# Patient Record
Sex: Female | Born: 1935 | Race: White | Hispanic: No | Smoking: Never smoker
Health system: Southern US, Community
[De-identification: ages and names within clinical notes are randomized; demographics above are authoritative.]

## PROBLEM LIST (undated history)

## (undated) DIAGNOSIS — S32591D Other specified fracture of right pubis, subsequent encounter for fracture with routine healing: Secondary | ICD-10-CM

## (undated) DIAGNOSIS — H409 Unspecified glaucoma: Secondary | ICD-10-CM

## (undated) DIAGNOSIS — U071 COVID-19: Secondary | ICD-10-CM

## (undated) DIAGNOSIS — R569 Unspecified convulsions: Secondary | ICD-10-CM

## (undated) DIAGNOSIS — R0600 Dyspnea, unspecified: Secondary | ICD-10-CM

## (undated) DIAGNOSIS — K219 Gastro-esophageal reflux disease without esophagitis: Secondary | ICD-10-CM

## (undated) DIAGNOSIS — I5033 Acute on chronic diastolic (congestive) heart failure: Secondary | ICD-10-CM

## (undated) DIAGNOSIS — E039 Hypothyroidism, unspecified: Secondary | ICD-10-CM

## (undated) DIAGNOSIS — M4850XA Collapsed vertebra, not elsewhere classified, site unspecified, initial encounter for fracture: Secondary | ICD-10-CM

## (undated) DIAGNOSIS — K56609 Unspecified intestinal obstruction, unspecified as to partial versus complete obstruction: Secondary | ICD-10-CM

## (undated) DIAGNOSIS — M549 Dorsalgia, unspecified: Secondary | ICD-10-CM

## (undated) DIAGNOSIS — I10 Essential (primary) hypertension: Secondary | ICD-10-CM

## (undated) DIAGNOSIS — I639 Cerebral infarction, unspecified: Secondary | ICD-10-CM

## (undated) DIAGNOSIS — G8929 Other chronic pain: Secondary | ICD-10-CM

## (undated) DIAGNOSIS — E785 Hyperlipidemia, unspecified: Secondary | ICD-10-CM

## (undated) DIAGNOSIS — E079 Disorder of thyroid, unspecified: Secondary | ICD-10-CM

## (undated) HISTORY — PX: CATARACT EXTRACTION, BILATERAL: SHX1313

## (undated) HISTORY — PX: EYE SURGERY: SHX253

## (undated) HISTORY — PX: CHOLECYSTECTOMY: SHX55

## (undated) HISTORY — DX: Disorder of thyroid, unspecified: E07.9

## (undated) HISTORY — DX: Essential (primary) hypertension: I10

## (undated) HISTORY — DX: Acute on chronic diastolic (congestive) heart failure: I50.33

## (undated) HISTORY — DX: Cerebral infarction, unspecified: I63.9

---
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
## 2014-05-29 DIAGNOSIS — Z8673 Personal history of transient ischemic attack (TIA), and cerebral infarction without residual deficits: Secondary | ICD-10-CM

## 2014-10-02 DIAGNOSIS — H409 Unspecified glaucoma: Secondary | ICD-10-CM | POA: Insufficient documentation

## 2014-10-02 DIAGNOSIS — R569 Unspecified convulsions: Secondary | ICD-10-CM

## 2014-10-02 DIAGNOSIS — G40909 Epilepsy, unspecified, not intractable, without status epilepticus: Secondary | ICD-10-CM | POA: Insufficient documentation

## 2014-10-02 DIAGNOSIS — I639 Cerebral infarction, unspecified: Secondary | ICD-10-CM

## 2014-10-02 DIAGNOSIS — I10 Essential (primary) hypertension: Secondary | ICD-10-CM | POA: Insufficient documentation

## 2014-10-02 DIAGNOSIS — K219 Gastro-esophageal reflux disease without esophagitis: Secondary | ICD-10-CM | POA: Insufficient documentation

## 2014-10-02 HISTORY — DX: Cerebral infarction, unspecified: I63.9

## 2014-10-02 HISTORY — DX: Unspecified convulsions: R56.9

## 2015-03-17 DIAGNOSIS — J189 Pneumonia, unspecified organism: Secondary | ICD-10-CM

## 2015-03-17 HISTORY — DX: Pneumonia, unspecified organism: J18.9

## 2015-10-20 ENCOUNTER — Encounter: Payer: Self-pay | Admitting: Podiatry

## 2015-10-20 ENCOUNTER — Ambulatory Visit (INDEPENDENT_AMBULATORY_CARE_PROVIDER_SITE_OTHER): Payer: Medicare Other | Admitting: Podiatry

## 2015-10-20 VITALS — BP 166/86 | HR 86 | Resp 12

## 2015-10-20 DIAGNOSIS — B351 Tinea unguium: Secondary | ICD-10-CM

## 2015-10-20 DIAGNOSIS — M722 Plantar fascial fibromatosis: Secondary | ICD-10-CM

## 2015-10-20 DIAGNOSIS — L600 Ingrowing nail: Secondary | ICD-10-CM | POA: Diagnosis not present

## 2015-10-20 MED ORDER — TRIAMCINOLONE ACETONIDE 10 MG/ML IJ SUSP
10.0000 mg | Freq: Once | INTRAMUSCULAR | Status: AC
  Administered 2015-10-20: 10 mg

## 2015-10-20 NOTE — Progress Notes (Signed)
   Subjective:    Patient ID: Raven DanielsElse Word, female    DOB: 01/20/1922, 81 y.o.   MRN: [id number]  HPI PT STATED LT FOOT GREAT TOENAIL IS PAINFUL GOING UP TO THE HEEL FOR 2 MONTHS. THE TOE IS GETTING WORSE ESPECIALLY AT NIGHT. TRIED NO TREATMENT.   Review of Systems  HENT: Positive for hearing loss.        Objective:   Physical Exam        Assessment & Plan:

## 2015-10-20 NOTE — Progress Notes (Signed)
Subjective:     Patient ID: Raven Peterson, female   DOB: 02/19/1922, 81 y.o.   MRN: [id number]  HPI patient presents stating my heel is bothering me left and I think sometimes my toenail bothers me. Patient is not a good historian and presents with daughter   Review of Systems  All other systems reviewed and are negative.      Objective:   Physical Exam  Constitutional: She is oriented to person, place, and time.  Cardiovascular: Intact distal pulses.   Musculoskeletal: Normal range of motion.  Neurological: She is oriented to person, place, and time.  Skin: Skin is warm and dry.  Nursing note and vitals reviewed.  neurovascular status found to be intact with muscle strength adequate range of motion within normal limits with discomfort in the plantar heel left at the insertion of the tendon the calcaneus moderate discomfort in the arch and then a thickened hallux toenail left     Assessment:      probable plantar fasciitis with diminished fat pad along with mycotic nail and flatfoot type    Plan:      H&P and x-rays reviewed with patient. Today I went ahead and I did inject the plantar fascia 3 motor Kenalog 5 g Xylocaine and applied fascial brace

## 2015-10-27 ENCOUNTER — Ambulatory Visit (INDEPENDENT_AMBULATORY_CARE_PROVIDER_SITE_OTHER): Payer: Medicare Other | Admitting: Podiatry

## 2015-10-27 ENCOUNTER — Encounter: Payer: Self-pay | Admitting: Podiatry

## 2015-10-27 VITALS — BP 183/94 | HR 84 | Resp 16

## 2015-10-27 DIAGNOSIS — B351 Tinea unguium: Secondary | ICD-10-CM

## 2015-10-27 DIAGNOSIS — M722 Plantar fascial fibromatosis: Secondary | ICD-10-CM

## 2015-10-27 NOTE — Progress Notes (Signed)
Subjective:     Patient ID: Raven Peterson, female   DOB: 12/15/1921, 81 y.o.   MRN: [id number]  HPI patient states my heel seems quite a bit better and my nails need to be cut as they're thick and I cannot cut them myself   Review of Systems     Objective:   Physical Exam Neurovascular status intact muscle strength adequate with thick nailbeds 1-5 both feet and discomfort in the plantar heel left that's improved quite a bit    Assessment:     Mycotic nail infection left and right that she cannot cut and plantar fasciitis improved    Plan:     Reviewed plantar fasciitis and instructed on continuing with fascial brace and debrided nailbeds 1-5 both feet with no iatrogenic bleeding noted

## 2016-01-26 ENCOUNTER — Ambulatory Visit (INDEPENDENT_AMBULATORY_CARE_PROVIDER_SITE_OTHER): Payer: Medicare Other | Admitting: Podiatry

## 2016-01-26 ENCOUNTER — Encounter: Payer: Self-pay | Admitting: Podiatry

## 2016-01-26 DIAGNOSIS — B351 Tinea unguium: Secondary | ICD-10-CM | POA: Diagnosis not present

## 2016-01-26 DIAGNOSIS — M79676 Pain in unspecified toe(s): Secondary | ICD-10-CM

## 2016-01-26 NOTE — Progress Notes (Signed)
Patient ID: Raven Peterson, female   DOB: 02/17/1922, 82 y.o.   MRN: [id number] Complaint:  Visit Type: Patient returns to my office for continued preventative foot care services. Complaint: Patient states" my nails have grown long and thick and become painful to walk and wear shoes" . The patient presents for preventative foot care services. No changes to ROS.  Patient still wearing brace for plantar fascitis.  Podiatric Exam: Vascular: dorsalis pedis and posterior tibial pulses are palpable bilateral. Capillary return is immediate. Temperature gradient is WNL. Skin turgor WNL  Sensorium: Normal Semmes Weinstein monofilament test. Normal tactile sensation bilaterally. Nail Exam: Pt has thick disfigured discolored nails with subungual debris noted bilateral entire nail hallux through fifth toenails Ulcer Exam: There is no evidence of ulcer or pre-ulcerative changes or infection. Orthopedic Exam: Muscle tone and strength are WNL. No limitations in general ROM. No crepitus or effusions noted. Foot type and digits show no abnormalities. Bony prominences are unremarkable. Skin: No Porokeratosis. No infection or ulcers  Diagnosis:  Onychomycosis, , Pain in right toe, pain in left toes  Treatment & Plan Procedures and Treatment: Consent by patient was obtained for treatment procedures. The patient understood the discussion of treatment and procedures well. All questions were answered thoroughly reviewed. Debridement of mycotic and hypertrophic toenails, 1 through 5 bilateral and clearing of subungual debris. No ulceration, no infection noted.  Return Visit-Office Procedure: Patient instructed to return to the office for a follow up visit 3 months for continued evaluation and treatment.    Belva Koziel DPM 

## 2016-01-28 ENCOUNTER — Ambulatory Visit: Payer: Medicare Other | Admitting: Podiatry

## 2016-04-03 ENCOUNTER — Emergency Department (HOSPITAL_COMMUNITY): Payer: Medicare Other

## 2016-04-03 ENCOUNTER — Encounter (HOSPITAL_COMMUNITY): Payer: Self-pay | Admitting: Emergency Medicine

## 2016-04-03 ENCOUNTER — Emergency Department (HOSPITAL_COMMUNITY)
Admission: EM | Admit: 2016-04-03 | Discharge: 2016-04-03 | Disposition: A | Discharge status: Home / Self Care | Payer: Medicare Other | Attending: Emergency Medicine | Admitting: Emergency Medicine

## 2016-04-03 DIAGNOSIS — E079 Disorder of thyroid, unspecified: Secondary | ICD-10-CM | POA: Diagnosis not present

## 2016-04-03 DIAGNOSIS — G8929 Other chronic pain: Secondary | ICD-10-CM | POA: Insufficient documentation

## 2016-04-03 DIAGNOSIS — Y998 Other external cause status: Secondary | ICD-10-CM | POA: Insufficient documentation

## 2016-04-03 DIAGNOSIS — Z79899 Other long term (current) drug therapy: Secondary | ICD-10-CM | POA: Diagnosis not present

## 2016-04-03 DIAGNOSIS — W19XXXA Unspecified fall, initial encounter: Secondary | ICD-10-CM

## 2016-04-03 DIAGNOSIS — Y9289 Other specified places as the place of occurrence of the external cause: Secondary | ICD-10-CM | POA: Diagnosis not present

## 2016-04-03 DIAGNOSIS — S0990XA Unspecified injury of head, initial encounter: Secondary | ICD-10-CM | POA: Diagnosis not present

## 2016-04-03 DIAGNOSIS — Z8673 Personal history of transient ischemic attack (TIA), and cerebral infarction without residual deficits: Secondary | ICD-10-CM | POA: Diagnosis not present

## 2016-04-03 DIAGNOSIS — Z8781 Personal history of (healed) traumatic fracture: Secondary | ICD-10-CM | POA: Diagnosis not present

## 2016-04-03 DIAGNOSIS — Y92009 Unspecified place in unspecified non-institutional (private) residence as the place of occurrence of the external cause: Secondary | ICD-10-CM

## 2016-04-03 DIAGNOSIS — I10 Essential (primary) hypertension: Secondary | ICD-10-CM | POA: Insufficient documentation

## 2016-04-03 DIAGNOSIS — S199XXA Unspecified injury of neck, initial encounter: Secondary | ICD-10-CM | POA: Diagnosis not present

## 2016-04-03 DIAGNOSIS — Y9301 Activity, walking, marching and hiking: Secondary | ICD-10-CM | POA: Insufficient documentation

## 2016-04-03 DIAGNOSIS — W01198A Fall on same level from slipping, tripping and stumbling with subsequent striking against other object, initial encounter: Secondary | ICD-10-CM | POA: Diagnosis not present

## 2016-04-03 DIAGNOSIS — S51811A Laceration without foreign body of right forearm, initial encounter: Secondary | ICD-10-CM

## 2016-04-03 DIAGNOSIS — S51801A Unspecified open wound of right forearm, initial encounter: Secondary | ICD-10-CM | POA: Diagnosis not present

## 2016-04-03 DIAGNOSIS — S59911A Unspecified injury of right forearm, initial encounter: Secondary | ICD-10-CM | POA: Diagnosis present

## 2016-04-03 HISTORY — DX: Dorsalgia, unspecified: M54.9

## 2016-04-03 HISTORY — DX: Other chronic pain: G89.29

## 2016-04-03 HISTORY — DX: Collapsed vertebra, not elsewhere classified, site unspecified, initial encounter for fracture: M48.50XA

## 2016-04-03 MED ORDER — ACETAMINOPHEN 325 MG PO TABS
650.0000 mg | ORAL_TABLET | Freq: Once | ORAL | Status: AC
  Administered 2016-04-03: 650 mg via ORAL
  Filled 2016-04-03: qty 2

## 2016-04-03 NOTE — ED Provider Notes (Signed)
CSN: [id number]     Arrival date & time 04/03/16  1559 History   First MD Initiated Contact with Patient 04/03/16 2025     Chief Complaint  Patient presents with  . Fall  . Head Injury  . Neck Injury      HPI  Pt was seen at 2030. Per pt and her family, c/o sudden onset and resolution of one episode of trip and fall that occurred today at 1530. Pt states she was walking with her cane (and not her usual walker) when her "right leg gave out." Pt states she has hx of "tripping over my toe" and "not lifting my leg up so good" for "quite a while." Pt endorses right leg "tingling" over the past 4 days. Pt c/o skin tear right forearm, head injury, neck and low back pain. Denies prodromal symptoms before fall. Denies CP/palpitations, no SOB/cough, no abd pain, no N/V/D, no near syncope/syncope, no visual changes, no focal motor weakness, no saddle anesthesia, no incont/retention of bowel or bladder.    Past Medical History  Diagnosis Date  . Hypertension   . Stroke (HCC)   . Thyroid disease   . Chronic back pain     "used to get shots in my back by a Pain Management doctor"  . Vertebral compression fracture [facility])    Past Surgical History  Procedure Laterality Date  . Eye surgery      Social History  Substance Use Topics  . Smoking status: Never Smoker   . Smokeless tobacco: None  . Alcohol Use: No    Review of Systems ROS: Statement: All systems negative except as marked or noted in the HPI; Constitutional: Negative for fever and chills. ; ; Eyes: Negative for eye pain, redness and discharge. ; ; ENMT: Negative for ear pain, hoarseness, nasal congestion, sinus pressure and sore throat. ; ; Cardiovascular: Negative for chest pain, palpitations, diaphoresis, dyspnea and peripheral edema. ; ; Respiratory: Negative for cough, wheezing and stridor. ; ; Gastrointestinal: Negative for nausea, vomiting, diarrhea, abdominal pain, blood in stool, hematemesis, jaundice and rectal bleeding. . ; ;  Genitourinary: Negative for dysuria, flank pain and hematuria. ; ; Musculoskeletal: +LBP, neck pain, head injury. Negative for swelling and deformity.; ; Skin: +skin tear. Negative for pruritus, rash, blisters, bruising and skin lesion.; ; Neuro: Negative for headache, lightheadedness and neck stiffness. Negative for weakness, altered level of consciousness , altered mental status, extremity weakness, involuntary movement, seizure and syncope.      Allergies  Cimetidine; Naproxen sodium; and Other  Home Medications   Prior to Admission medications   Medication Sig Start Date End Date Taking? Authorizing Provider  apixaban (ELIQUIS) 2.5 MG TABS tablet Take 2.5 mg by mouth 2 (two) times daily. Reported on 04/03/2016 06/16/15 06/15/16 Yes Historical Provider, MD  atorvastatin (LIPITOR) 40 MG tablet Take 40 mg by mouth daily.   Yes Historical Provider, MD  Bepotastine Besilate 1.5 % SOLN Apply 1 drop to eye every morning.    Yes Historical Provider, MD  brimonidine (ALPHAGAN P) 0.1 % SOLN Apply 1 drop to eye 2 (two) times daily.    Yes Historical Provider, MD  esomeprazole (NEXIUM) 40 MG capsule Take 40 mg by mouth at bedtime.    Yes Historical Provider, MD  HYDROcodone-acetaminophen (NORCO/VICODIN) 5-325 MG tablet Take 1 tablet by mouth at bedtime.  04/02/15  Yes Historical Provider, MD  Lacosamide (VIMPAT) 100 MG TABS Take 100 mg by mouth 2 (two) times daily.  07/13/15 07/12/16 Yes  Historical Provider, MD  latanoprost (XALATAN) 0.005 % ophthalmic solution Place 1 drop into both eyes at bedtime.    Yes Historical Provider, MD  levothyroxine (SYNTHROID, LEVOTHROID) 25 MCG tablet Take 25 mcg by mouth daily before breakfast.  07/13/15  Yes Historical Provider, MD  lisinopril (PRINIVIL,ZESTRIL) 5 MG tablet Take 5 mg by mouth daily.    Yes Historical Provider, MD  montelukast (SINGULAIR) 10 MG tablet Take by mouth at bedtime.    Yes Historical Provider, MD   BP 187/73 mmHg  Pulse 88  Temp(Src) 98.7 F (37.1 C)  (Oral)  Resp 16  SpO2 97% Physical Exam  2035: Physical examination:  Nursing notes reviewed; Vital signs and O2 SAT reviewed;  Constitutional: Well developed, Well nourished, Well hydrated, In no acute distress; Head:  Normocephalic, atraumatic; Eyes: EOMI, PERRL, No scleral icterus; ENMT: Mouth and pharynx normal, Mucous membranes moist; Neck: Supple, Full range of motion, No lymphadenopathy; Cardiovascular: Regular rate and rhythm, No gallop; Respiratory: Breath sounds clear & equal bilaterally, No wheezes.  Speaking full sentences with ease, Normal respiratory effort/excursion; Chest: Nontender, Movement normal; Abdomen: Soft, Nontender, Nondistended, Normal bowel sounds; Genitourinary: No CVA tenderness; Spine:  No midline CS, TS, LS tenderness. +TTP right cervical and lumbar paraspinal muscles.;; Extremities: Pulses normal, No tenderness, Pelvis stable. +skin tear right dorsal forearm. No edema, No calf edema or asymmetry.; Neuro: AA&Ox3, Major CN grossly intact.  Speech clear. No gross focal motor deficits in extremities. Grips equal. Strength 4-5/5 equal bilat UE's and LE's, including great toe dorsiflexion. DTR 2/4 equal bilat UE's and LE's. No gross sensory deficits. Pt c/o LBP when she lifts her RLE off stretcher..; Skin: Color normal, Warm, Dry.    ED Course  Procedures (including critical care time) Labs Review   Imaging Review  I have personally reviewed and evaluated these images and lab results as part of my medical decision-making.   EKG Interpretation None      MDM  MDM Reviewed: previous chart, vitals and nursing note Interpretation: x-ray and CT scan     Ct Head Wo Contrast 04/03/2016  CLINICAL DATA:  Head, neck and face pain following a fall. EXAM: CT HEAD WITHOUT CONTRAST CT MAXILLOFACIAL WITHOUT CONTRAST CT CERVICAL SPINE WITHOUT CONTRAST TECHNIQUE: Multidetector CT imaging of the head, cervical spine, and maxillofacial structures were performed using the standard  protocol without intravenous contrast. Multiplanar CT image reconstructions of the cervical spine and maxillofacial structures were also generated. COMPARISON:  None. FINDINGS: CT HEAD FINDINGS Diffusely enlarged ventricles and subarachnoid spaces. Patchy white matter low density in both cerebral hemispheres. Old right basal ganglia lacunar infarct. No skull fracture, intracranial hemorrhage or paranasal sinus air-fluid levels. CT MAXILLOFACIAL FINDINGS Multilevel degenerative changes. No fractures or paranasal sinus air-fluid levels. CT CERVICAL SPINE FINDINGS Multilevel degenerative changes. No prevertebral soft tissue swelling, acute fractures or subluxations. There are superior and inferior endplate convexities involving the the T1, T2 and T3 vertebral bodies. There is approximately 30% loss of height of the T2 vertebral body and approximately 20% loss of height of the T3 vertebral body. No bony retropulsion. Bilateral carotid artery calcifications. Mild heterogeneity of the thyroid gland. IMPRESSION: 1. No skull fracture or intracranial hemorrhage. 2. No acute cervical spine fracture or subluxation. There are probable old T2 and T3 vertebral compression deformities. 3. No maxillofacial fracture. 4. Diffuse cerebral atrophy and chronic small vessel white matter ischemic changes. 5. Cervical spine degenerative changes. 6. Bilateral carotid artery atheromatous calcifications. Electronically Signed   By: Beckie SaltsSteven  Reid  M.D.   On: 04/03/2016 18:42   Ct Cervical Spine Wo Contrast 04/03/2016  CLINICAL DATA:  Head, neck and face pain following a fall. EXAM: CT HEAD WITHOUT CONTRAST CT MAXILLOFACIAL WITHOUT CONTRAST CT CERVICAL SPINE WITHOUT CONTRAST TECHNIQUE: Multidetector CT imaging of the head, cervical spine, and maxillofacial structures were performed using the standard protocol without intravenous contrast. Multiplanar CT image reconstructions of the cervical spine and maxillofacial structures were also  generated. COMPARISON:  None. FINDINGS: CT HEAD FINDINGS Diffusely enlarged ventricles and subarachnoid spaces. Patchy white matter low density in both cerebral hemispheres. Old right basal ganglia lacunar infarct. No skull fracture, intracranial hemorrhage or paranasal sinus air-fluid levels. CT MAXILLOFACIAL FINDINGS Multilevel degenerative changes. No fractures or paranasal sinus air-fluid levels. CT CERVICAL SPINE FINDINGS Multilevel degenerative changes. No prevertebral soft tissue swelling, acute fractures or subluxations. There are superior and inferior endplate convexities involving the the T1, T2 and T3 vertebral bodies. There is approximately 30% loss of height of the T2 vertebral body and approximately 20% loss of height of the T3 vertebral body. No bony retropulsion. Bilateral carotid artery calcifications. Mild heterogeneity of the thyroid gland. IMPRESSION: 1. No skull fracture or intracranial hemorrhage. 2. No acute cervical spine fracture or subluxation. There are probable old T2 and T3 vertebral compression deformities. 3. No maxillofacial fracture. 4. Diffuse cerebral atrophy and chronic small vessel white matter ischemic changes. 5. Cervical spine degenerative changes. 6. Bilateral carotid artery atheromatous calcifications. Electronically Signed   By: Beckie Salts M.D.   On: 04/03/2016 18:42   Ct Lumbar Spine Wo Contrast 04/03/2016  CLINICAL DATA:  Low back pain and right leg weakness. EXAM: CT LUMBAR SPINE WITHOUT CONTRAST TECHNIQUE: Multidetector CT imaging of the lumbar spine was performed without intravenous contrast administration. Multiplanar CT image reconstructions were also generated. COMPARISON:  None. FINDINGS: L3 and L4 compression fractures with up to 50% height loss. There is mild borderline moderate retropulsion at both levels. The fractures appear chronic and healed. No acute fracture is seen. No traumatic malalignment. Osteopenia. Disc levels: L2-L3: Disc: Narrowing and  bulging. Facets: Arthropathy with spurring and ligament thickening Canal: Mild stenosis, mainly from chronic retropulsion and ligament thickening. Foramina: Narrowing without suspected impingement L3-L4: Disc: Narrowing and bulging. Bulging is preferentially to the right foraminal and far-lateral regions. Facets: Arthropathy with spurring and ligament thickening. Canal: Moderate stenosis, greatest in the lateral canal Foramina: Stenosis on the right with L3 impingement due to disc bulge primarily L4-L5: Disc: Mild narrowing and bulging Facets: Arthropathy with marginal spurring and calcified ligamentous thickening Canal: Patent. Foramina: No impingement. L5-S1: Disc: Mild narrowing and bulging Facets: Facet arthropathy with calcified ligamentous thickening. Canal: Patent. Foramina: No impingement. IMPRESSION: 1. No acute osseous finding. L3 and L4 compression fractures appear chronic. 2. L3-4 degenerative right foraminal stenosis with L3 impingement and moderate canal stenosis. No other explanation for right radiculopathy. Electronically Signed   By: Marnee Spring M.D.   On: 04/03/2016 21:46   Dg Hip Unilat With Pelvis 2-3 Views Right 04/03/2016  CLINICAL DATA:  Right hip pain and right leg weakness. EXAM: DG HIP (WITH OR WITHOUT PELVIS) 2-3V RIGHT COMPARISON:  None. FINDINGS: Diffuse osteopenia. Mild right hip degenerative changes. No fracture or dislocation seen. Lower lumbar spine degenerative changes. Atheromatous arterial calcifications. IMPRESSION: 1. No acute abnormality. 2. Mild right hip degenerative changes. 3. Lower lumbar spine degenerative changes. Electronically Signed   By: Beckie Salts M.D.   On: 04/03/2016 21:31   Ct Maxillofacial Wo Cm 04/03/2016  CLINICAL DATA:  Head, neck and face pain following a fall. EXAM: CT HEAD WITHOUT CONTRAST CT MAXILLOFACIAL WITHOUT CONTRAST CT CERVICAL SPINE WITHOUT CONTRAST TECHNIQUE: Multidetector CT imaging of the head, cervical spine, and maxillofacial  structures were performed using the standard protocol without intravenous contrast. Multiplanar CT image reconstructions of the cervical spine and maxillofacial structures were also generated. COMPARISON:  None. FINDINGS: CT HEAD FINDINGS Diffusely enlarged ventricles and subarachnoid spaces. Patchy white matter low density in both cerebral hemispheres. Old right basal ganglia lacunar infarct. No skull fracture, intracranial hemorrhage or paranasal sinus air-fluid levels. CT MAXILLOFACIAL FINDINGS Multilevel degenerative changes. No fractures or paranasal sinus air-fluid levels. CT CERVICAL SPINE FINDINGS Multilevel degenerative changes. No prevertebral soft tissue swelling, acute fractures or subluxations. There are superior and inferior endplate convexities involving the the T1, T2 and T3 vertebral bodies. There is approximately 30% loss of height of the T2 vertebral body and approximately 20% loss of height of the T3 vertebral body. No bony retropulsion. Bilateral carotid artery calcifications. Mild heterogeneity of the thyroid gland. IMPRESSION: 1. No skull fracture or intracranial hemorrhage. 2. No acute cervical spine fracture or subluxation. There are probable old T2 and T3 vertebral compression deformities. 3. No maxillofacial fracture. 4. Diffuse cerebral atrophy and chronic small vessel white matter ischemic changes. 5. Cervical spine degenerative changes. 6. Bilateral carotid artery atheromatous calcifications. Electronically Signed   By: Beckie Salts M.D.   On: 04/03/2016 18:42     2220:  XR and CT scans reassuring. Pt ambulated with her walker with NAD, resps easy, and at her baseline gait, per family observing. Wound care provided. Pt and family would like to go home now. Long d/w pt and family regarding DDx, Dx and testing: including possibility of stroke (pt already maximized on Eliquis and CT-H negative 4 days after RLE symptom onset), lumbar radiculopathy, peripheral neuropathy.  Questions  answered.  Verb understanding. Family and pt would like to go home and f/u with her Neuro MD as outpatient. Pt d/c stable.    Samuel Jester, DO 04/05/16 1601

## 2016-04-03 NOTE — ED Notes (Signed)
Pt ambulated in hallway with her home walker. Pt was able to ambulate without difficulty. Family states pt is ambulating at baseline.

## 2016-04-03 NOTE — Discharge Instructions (Signed)
Take your usual prescriptions as previously directed.  Cover the skin tear area with a clean/dry dressing.  Change the dressing whenever it becomes wet or soiled after washing the area with soap and water.  Walk with your walker. Call your regular medical doctor and your Neurologist tomorrow to schedule a follow up appointment for a recheck this week.  Return to the Emergency Department immediately if worsening.

## 2016-04-03 NOTE — ED Notes (Addendum)
Fell around 15:30 today, on Eliquis, unsure if she hit her head, "I got a fat lip." C/o right arm pain and right side pain with right neck pain/stiffness. Family members state they haven't noticed her acting any different than her usual. Pt states she's had some tingling in her right leg over the last couple of days, and that today it just gave out. No obvious deficits on focal neuro exam in triage, legs do appear bilaterally weak with plantar/dorsi flexion. Does have a 4 in skin tear to posterior right arm that was bandaged by EMS

## 2016-04-19 ENCOUNTER — Ambulatory Visit (INDEPENDENT_AMBULATORY_CARE_PROVIDER_SITE_OTHER): Payer: Medicare Other | Admitting: Podiatry

## 2016-04-19 ENCOUNTER — Encounter: Payer: Self-pay | Admitting: Podiatry

## 2016-04-19 DIAGNOSIS — B351 Tinea unguium: Secondary | ICD-10-CM

## 2016-04-19 DIAGNOSIS — M79676 Pain in unspecified toe(s): Secondary | ICD-10-CM | POA: Diagnosis not present

## 2016-04-19 NOTE — Progress Notes (Signed)
Patient ID: Raven Peterson, female   DOB: 11/23/1922, 82 y.o.   MRN: [id number] Complaint:  Visit Type: Patient returns to my office for continued preventative foot care services. Complaint: Patient states" my nails have grown long and thick and become painful to walk and wear shoes" . The patient presents for preventative foot care services. No changes to ROS.  Patient still wearing brace for plantar fascitis.  Podiatric Exam: Vascular: dorsalis pedis and posterior tibial pulses are palpable bilateral. Capillary return is immediate. Temperature gradient is WNL. Skin turgor WNL  Sensorium: Normal Semmes Weinstein monofilament test. Normal tactile sensation bilaterally. Nail Exam: Pt has thick disfigured discolored nails with subungual debris noted bilateral entire nail hallux through fifth toenails Ulcer Exam: There is no evidence of ulcer or pre-ulcerative changes or infection. Orthopedic Exam: Muscle tone and strength are WNL. No limitations in general ROM. No crepitus or effusions noted. Foot type and digits show no abnormalities. Bony prominences are unremarkable. Skin: No Porokeratosis. No infection or ulcers  Diagnosis:  Onychomycosis, , Pain in right toe, pain in left toes  Treatment & Plan Procedures and Treatment: Consent by patient was obtained for treatment procedures. The patient understood the discussion of treatment and procedures well. All questions were answered thoroughly reviewed. Debridement of mycotic and hypertrophic toenails, 1 through 5 bilateral and clearing of subungual debris. No ulceration, no infection noted.  Return Visit-Office Procedure: Patient instructed to return to the office for a follow up visit 3 months for continued evaluation and treatment.    Erion Weightman DPM 

## 2016-07-13 ENCOUNTER — Ambulatory Visit (INDEPENDENT_AMBULATORY_CARE_PROVIDER_SITE_OTHER): Payer: Medicare Other | Admitting: Podiatry

## 2016-07-13 DIAGNOSIS — B351 Tinea unguium: Secondary | ICD-10-CM | POA: Diagnosis not present

## 2016-07-13 DIAGNOSIS — M79676 Pain in unspecified toe(s): Secondary | ICD-10-CM

## 2016-07-13 NOTE — Progress Notes (Signed)
Patient ID: Raven Peterson, female   DOB: 1922-05-10, 82 y.o.   MRN: [id number] Complaint:  Visit Type: Patient returns to my office for continued preventative foot care services. Complaint: Patient states" my nails have grown long and thick and become painful to walk and wear shoes" . The patient presents for preventative foot care services. No changes to ROS.  Patient still wearing brace for plantar fascitis.  Podiatric Exam: Vascular: dorsalis pedis and posterior tibial pulses are palpable bilateral. Capillary return is immediate. Temperature gradient is WNL. Skin turgor WNL  Sensorium: Normal Semmes Weinstein monofilament test. Normal tactile sensation bilaterally. Nail Exam: Pt has thick disfigured discolored nails with subungual debris noted bilateral entire nail hallux through fifth toenails Ulcer Exam: There is no evidence of ulcer or pre-ulcerative changes or infection. Orthopedic Exam: Muscle tone and strength are WNL. No limitations in general ROM. No crepitus or effusions noted. Foot type and digits show no abnormalities. Bony prominences are unremarkable. Skin: No Porokeratosis. No infection or ulcers  Diagnosis:  Onychomycosis, , Pain in right toe, pain in left toes  Treatment & Plan Procedures and Treatment: Consent by patient was obtained for treatment procedures. The patient understood the discussion of treatment and procedures well. All questions were answered thoroughly reviewed. Debridement of mycotic and hypertrophic toenails, 1 through 5 bilateral and clearing of subungual debris. No ulceration, no infection noted.  Return Visit-Office Procedure: Patient instructed to return to the office for a follow up visit 3 months for continued evaluation and treatment.    Helane Gunther DPM

## 2016-10-05 ENCOUNTER — Ambulatory Visit: Payer: Medicare Other | Admitting: Podiatry

## 2016-10-11 ENCOUNTER — Ambulatory Visit: Payer: Medicare Other | Admitting: Podiatry

## 2016-12-11 DIAGNOSIS — B9620 Unspecified Escherichia coli [E. coli] as the cause of diseases classified elsewhere: Secondary | ICD-10-CM

## 2016-12-11 DIAGNOSIS — N390 Urinary tract infection, site not specified: Secondary | ICD-10-CM

## 2016-12-11 HISTORY — DX: Urinary tract infection, site not specified: N39.0

## 2016-12-11 HISTORY — DX: Unspecified Escherichia coli (E. coli) as the cause of diseases classified elsewhere: B96.20

## 2017-05-19 ENCOUNTER — Emergency Department (HOSPITAL_COMMUNITY)
Admission: EM | Admit: 2017-05-19 | Discharge: 2017-05-19 | Disposition: A | Discharge status: Home / Self Care | Payer: Medicare Other | Attending: Emergency Medicine | Admitting: Emergency Medicine

## 2017-05-19 ENCOUNTER — Encounter (HOSPITAL_COMMUNITY): Payer: Self-pay

## 2017-05-19 DIAGNOSIS — Z7901 Long term (current) use of anticoagulants: Secondary | ICD-10-CM | POA: Diagnosis not present

## 2017-05-19 DIAGNOSIS — I10 Essential (primary) hypertension: Secondary | ICD-10-CM | POA: Insufficient documentation

## 2017-05-19 DIAGNOSIS — N3001 Acute cystitis with hematuria: Secondary | ICD-10-CM | POA: Diagnosis not present

## 2017-05-19 DIAGNOSIS — R300 Dysuria: Secondary | ICD-10-CM | POA: Diagnosis present

## 2017-05-19 DIAGNOSIS — Z79899 Other long term (current) drug therapy: Secondary | ICD-10-CM | POA: Diagnosis not present

## 2017-05-19 LAB — BASIC METABOLIC PANEL
Anion gap: 11 (ref 5–15)
BUN: 15 mg/dL (ref 6–20)
CALCIUM: 9 mg/dL (ref 8.9–10.3)
CO2: 22 mmol/L (ref 22–32)
CREATININE: 1.09 mg/dL — AB (ref 0.44–1.00)
Chloride: 107 mmol/L (ref 101–111)
GFR calc Af Amer: 49 mL/min — ABNORMAL LOW (ref >60)
GFR, EST NON AFRICAN AMERICAN: 43 mL/min — AB (ref >60)
Glucose, Bld: 104 mg/dL — ABNORMAL HIGH (ref 65–99)
Potassium: 4 mmol/L (ref 3.5–5.1)
SODIUM: 140 mmol/L (ref 135–145)

## 2017-05-19 LAB — URINALYSIS, ROUTINE W REFLEX MICROSCOPIC
BILIRUBIN URINE: NEGATIVE
GLUCOSE, UA: NEGATIVE mg/dL
KETONES UR: NEGATIVE mg/dL
Nitrite: NEGATIVE
PH: 5 (ref 5.0–8.0)
PROTEIN: NEGATIVE mg/dL
Specific Gravity, Urine: 1.008 (ref 1.005–1.030)

## 2017-05-19 LAB — CBC
HCT: 43.5 % (ref 36.0–46.0)
Hemoglobin: 14.9 g/dL (ref 12.0–15.0)
MCH: 31.4 pg (ref 26.0–34.0)
MCHC: 34.3 g/dL (ref 30.0–36.0)
MCV: 91.6 fL (ref 78.0–100.0)
PLATELETS: 238 10*3/uL (ref 150–400)
RBC: 4.75 MIL/uL (ref 3.87–5.11)
RDW: 13 % (ref 11.5–15.5)
WBC: 8.8 10*3/uL (ref 4.0–10.5)

## 2017-05-19 MED ORDER — CEFTRIAXONE SODIUM 1 G IJ SOLR
1.0000 g | Freq: Once | INTRAMUSCULAR | Status: AC
  Administered 2017-05-19: 1 g via INTRAMUSCULAR
  Filled 2017-05-19: qty 10

## 2017-05-19 MED ORDER — LIDOCAINE HCL 1 % IJ SOLN
INTRAMUSCULAR | Status: AC
  Administered 2017-05-19: 2.1 mL
  Filled 2017-05-19: qty 20

## 2017-05-19 MED ORDER — CEPHALEXIN 500 MG PO CAPS: 500.0000 mg | ORAL_CAPSULE | Freq: Three times a day (TID) | ORAL | 0 refills | Status: DC

## 2017-05-19 NOTE — ED Provider Notes (Signed)
[facility] Provider Note   CSN: [id number] Arrival date & time: 05/19/17  0909     History   Chief Complaint Chief Complaint  Patient presents with  . Urinary Tract Infection    HPI Raven Peterson is a 83 y.o. female.  HPI Patient is a 83 year old female presents the emergency department with her family member reporting dysuria over the past 2 days of suprapubic abdominal discomfort.  She denies nausea vomiting.  She denies fevers and chills.  Denies flank pain.  She states it feels like urinary tract infection.  No other complaints at this time.  She did take her morning medications.  Hypertension noted on arrival.  No headache.  No chest pain.  No shortness of breath.   Past Medical History:  Diagnosis Date  . Chronic back pain    "used to get shots in my back by a Pain Management doctor"  . Hypertension   . Stroke (HCC)   . Thyroid disease   . Vertebral compression fracture (HCC)     There are no active problems to display for this patient.   Past Surgical History:  Procedure Laterality Date  . EYE SURGERY      OB History    No data available       Home Medications    Prior to Admission medications   Medication Sig Start Date End Date Taking? Authorizing Provider  Alendronate Sodium (FOSAMAX PO) Take by mouth once a week.    [provider]  apixaban (ELIQUIS) 2.5 MG TABS tablet Take 2.5 mg by mouth 2 (two) times daily. Reported on 04/03/2016 06/16/15 06/15/16  [provider]  atorvastatin (LIPITOR) 40 MG tablet Take 40 mg by mouth daily.    [provider]  Bepotastine Besilate 1.5 % SOLN Apply 1 drop to eye every morning.     [provider]  brimonidine (ALPHAGAN P) 0.1 % SOLN Apply 1 drop to eye 2 (two) times daily.     [provider]  cephALEXin (KEFLEX) 500 MG capsule Take 1 capsule (500 mg total) by mouth 3 (three) times daily. 05/19/17   Azalia Bilis, MD  esomeprazole (NEXIUM) 40 MG capsule Take 40 mg by  mouth at bedtime.     [provider]  HYDROcodone-acetaminophen (NORCO/VICODIN) 5-325 MG tablet Take 1 tablet by mouth at bedtime.  04/02/15   [provider]  Lacosamide (VIMPAT) 100 MG TABS Take 100 mg by mouth 2 (two) times daily.  07/13/15 07/12/16  [provider]  latanoprost (XALATAN) 0.005 % ophthalmic solution Place 1 drop into both eyes at bedtime.     [provider]  levothyroxine (SYNTHROID, LEVOTHROID) 25 MCG tablet Take 25 mcg by mouth daily before breakfast.  07/13/15   [provider]  lisinopril (PRINIVIL,ZESTRIL) 5 MG tablet Take 5 mg by mouth daily.     [provider]  montelukast (SINGULAIR) 10 MG tablet Take by mouth at bedtime.     [provider]    Family History No family history on file.  Social History Social History  Substance Use Topics  . Smoking status: Never Smoker  . Smokeless tobacco: Not on file  . Alcohol use No     Allergies   Cimetidine; Naproxen sodium; Iodinated diagnostic agents; and Other   Review of Systems Review of Systems  All other systems reviewed and are negative.    Physical Exam Updated Vital Signs BP (!) 197/71 (BP Location: Left Arm)   Pulse 79  Temp 98.1 F (36.7 C) (Oral)   Resp 20   SpO2 98%   Physical Exam  Constitutional: She is oriented to person, place, and time. She appears well-developed and well-nourished. No distress.  Appears younger than stated age  HENT:  Head: Normocephalic and atraumatic.  Eyes: EOM are normal.  Neck: Normal range of motion.  Cardiovascular: Normal rate, regular rhythm and normal heart sounds.   Pulmonary/Chest: Effort normal and breath sounds normal.  Abdominal: Soft. She exhibits no distension.  Mild suprapubic tenderness  Musculoskeletal: Normal range of motion.  Neurological: She is alert and oriented to person, place, and time.  Skin: Skin is warm and dry.  Psychiatric: She has a normal mood and affect. Judgment  normal.  Nursing note and vitals reviewed.    ED Treatments / Results  Labs (all labs ordered are listed, but only abnormal results are displayed) Labs Reviewed  URINALYSIS, ROUTINE W REFLEX MICROSCOPIC - Abnormal; Notable for the following:       Result Value   APPearance CLOUDY (*)    Hgb urine dipstick LARGE (*)    Leukocytes, UA LARGE (*)    Bacteria, UA RARE (*)    Squamous Epithelial / LPF 0-5 (*)    All other components within normal limits  BASIC METABOLIC PANEL - Abnormal; Notable for the following:    Glucose, Bld 104 (*)    Creatinine, Ser 1.09 (*)    GFR calc non Af Amer 43 (*)    GFR calc Af Amer 49 (*)    All other components within normal limits  URINE CULTURE  CBC    EKG  EKG Interpretation None       Radiology No results found.  Procedures Procedures (including critical care time)  Medications Ordered in ED Medications  cefTRIAXone (ROCEPHIN) injection 1 g (1 g Intramuscular Given 05/19/17 1057)  lidocaine (XYLOCAINE) 1 % (with pres) injection (2.1 mLs  Given 05/19/17 1056)     Initial Impression / Assessment and Plan / ED Course  I have reviewed the triage vital signs and the nursing notes.  Pertinent labs & imaging results that were available during my care of the patient were reviewed by me and considered in my medical decision making (see chart for details).     Appears to represent urinary tract infection.  Vital signs stable.  Patient feels much better.  Discharge home in good condition.  She did take a hydrocodone for pain prior to arrival and on arrival she has no abdominal pain at all.  No flank pain.  Doubt hila.  I am Rocephin given.  Urine culture sent.  Asian and family understand to return to the ER for new or worsening symptoms  Final Clinical Impressions(s) / ED Diagnoses   Final diagnoses:  Acute cystitis with hematuria    New Prescriptions New Prescriptions   CEPHALEXIN (KEFLEX) 500 MG CAPSULE    Take 1 capsule (500 mg  total) by mouth 3 (three) times daily.     Azalia Bilisampos, Aundra Espin, MD 05/19/17 1242

## 2017-05-19 NOTE — ED Triage Notes (Signed)
She c/o some bladder area discomfort and mild dysuria x 1-2 days. She is in no distress and ambulates capably with wheeled walker.

## 2017-05-21 LAB — URINE CULTURE

## 2017-05-22 ENCOUNTER — Telehealth: Payer: Self-pay | Admitting: Emergency Medicine

## 2017-05-22 NOTE — Telephone Encounter (Signed)
Post ED Visit - Positive Culture Follow-up  Culture report reviewed by antimicrobial stewardship pharmacist:  []  Enzo BiNathan Batchelder, Pharm.D. []  Celedonio MiyamotoJeremy Frens, Pharm.D., BCPS AQ-ID []  Garvin FilaMike Maccia, Pharm.D., BCPS [x]  Georgina PillionElizabeth Martin, [address]Pharm[address]D., BCPS []  MabscottMinh Pham, [place]arm[place]D., BCPS, AAHIVP []  Estella HuskMichelle Turner, Pharm.D., BCPS, AAHIVP []  Lysle Pearlachel Rumbarger, PharmD, BCPS []  Casilda Carlsaylor Stone, PharmD, BCPS []  Pollyann SamplesAndy Johnston, PharmD, BCPS  Positive urine culture Treated with cephalexin, organism sensitive to the same and no further patient follow-up is required at this time.  Berle MullMiller, Berlynn Warsame 05/22/2017, 10:50 AM

## 2017-09-25 IMAGING — CT CT L SPINE W/O CM
3 of 4 series · 12 of 33 positions shown, 14 images · non-contrast
Comparison: None.

CLINICAL DATA: Low back pain and right leg weakness.

EXAM:
CT LUMBAR SPINE WITHOUT CONTRAST
TECHNIQUE: Multidetector CT imaging of the lumbar spine was performed without
intravenous contrast administration. Multiplanar CT image
reconstructions were also generated.

[Series 3: l-spine · axial · 0.28mm/px · z∈[-400,-176]mm · 5 of 168 slices shown, 7 images]
[im 28/168  soft-tissue]
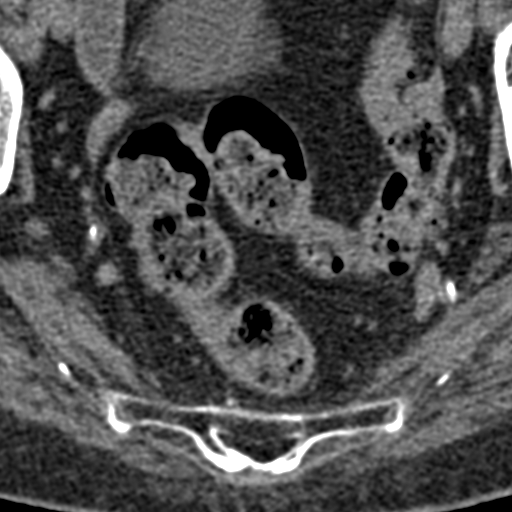
[im 28/168  bone]
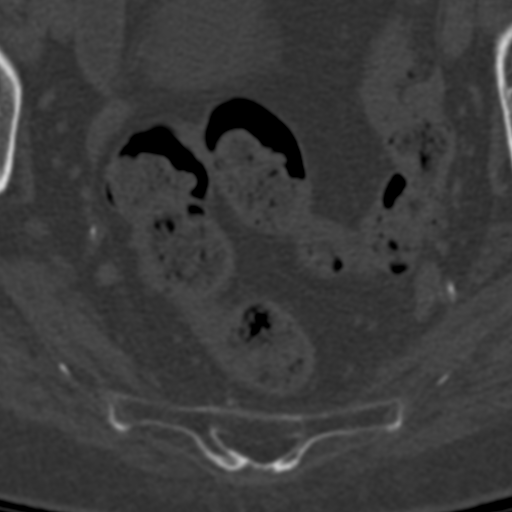
[im 56/168  bone]
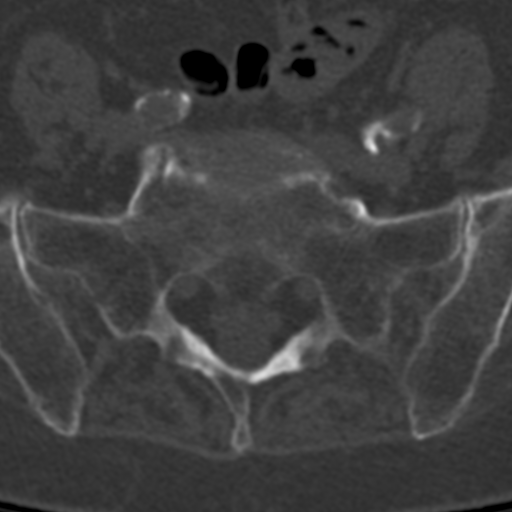
[im 84/168  bone]
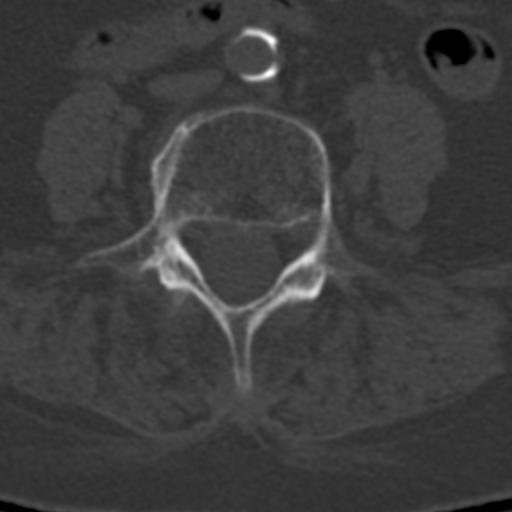
[im 112/168  bone]
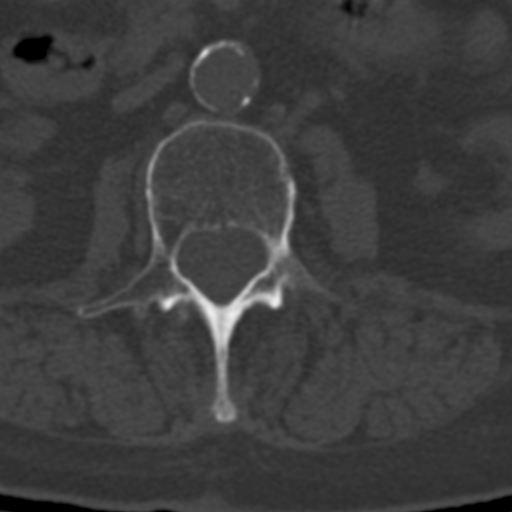
[im 140/168  soft-tissue]
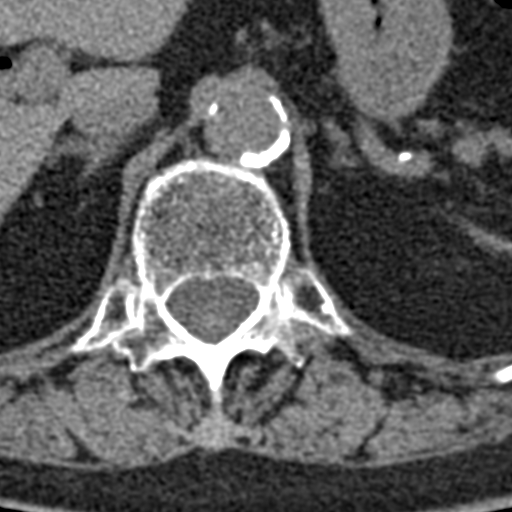
[im 140/168  bone]
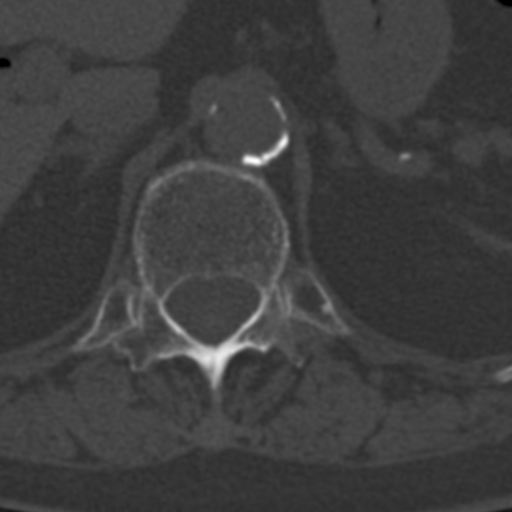

[Series 5: coronal · coronal · 0.27mm/px · 3 of 61 slices shown]
[im 13/61  bone]
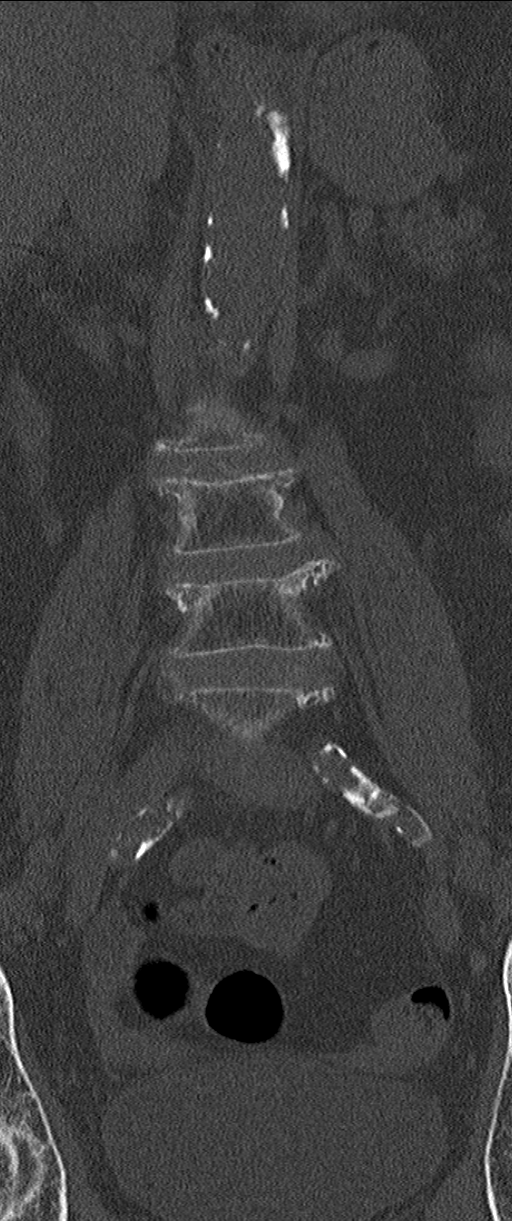
[im 25/61  bone]
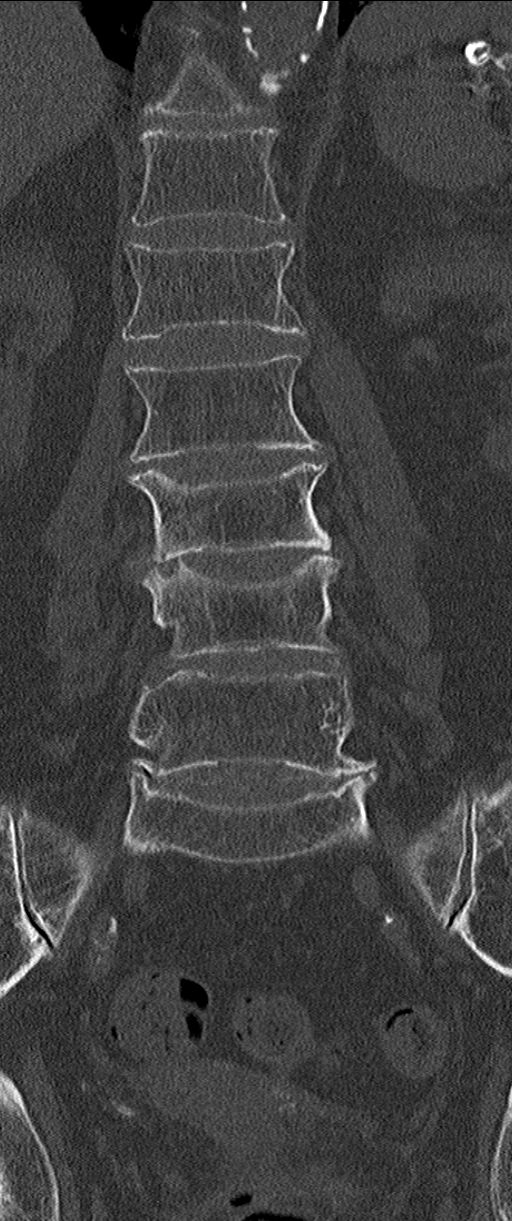
[im 37/61  bone]
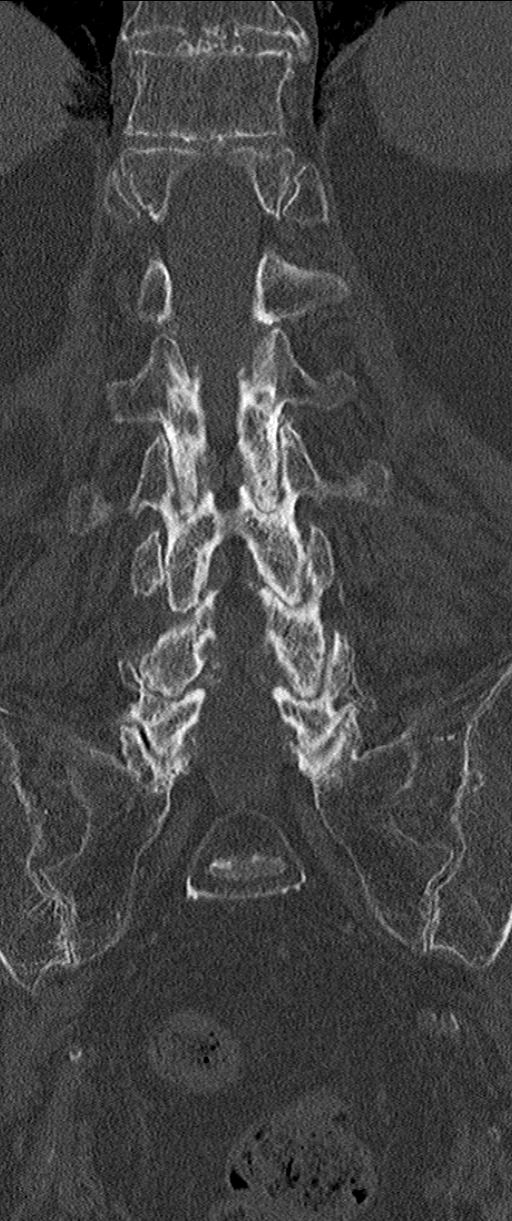

[Series 7: axial reformats · axial · 0.23mm/px · z∈[-391,-229]mm · 4 of 163 slices shown]
[im 28/163  bone]
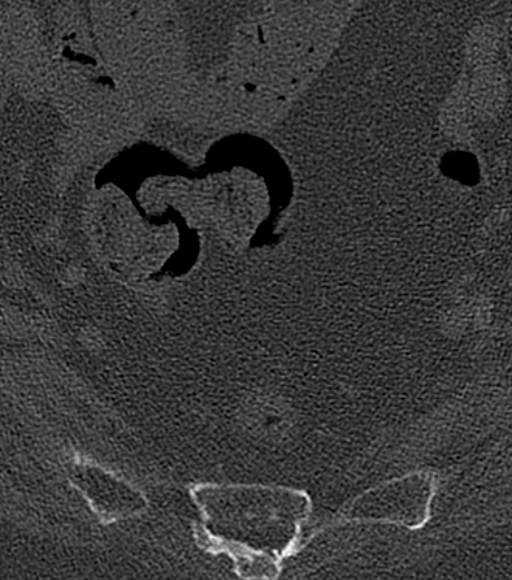
[im 55/163  bone]
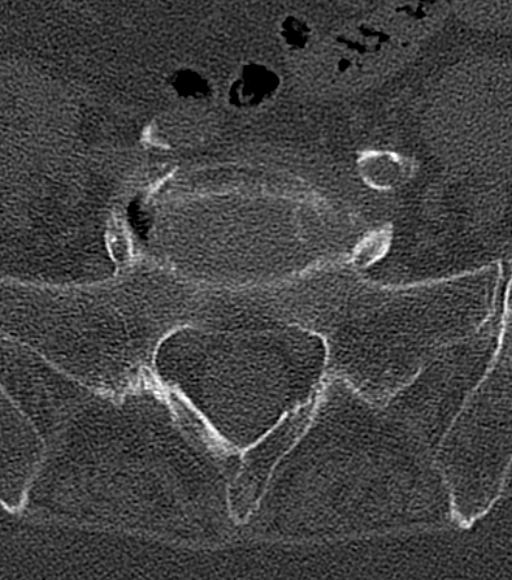
[im 82/163  bone]
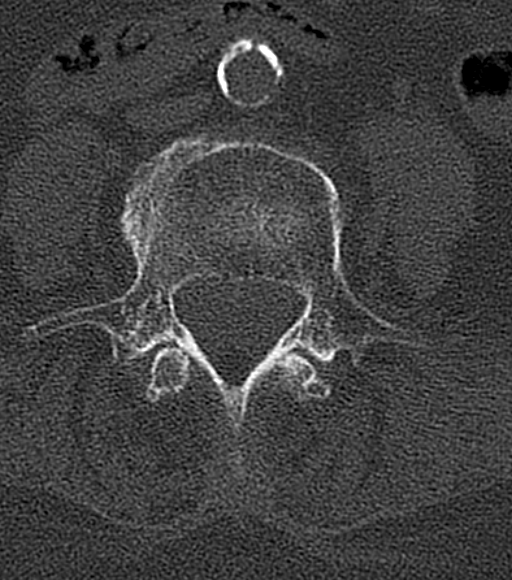
[im 109/163  bone]
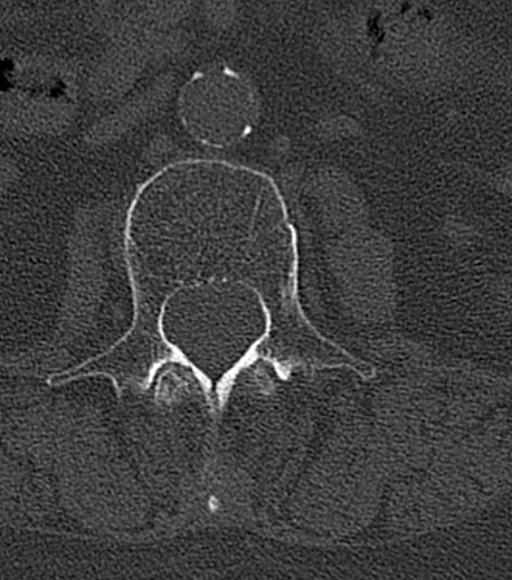

[12 of 33 positions shown; findings below may reference images not displayed]

FINDINGS: L3 and L4 compression fractures with up to 50% height loss. There is
mild borderline moderate retropulsion at both levels. The fractures
appear chronic and healed. No acute fracture is seen. No traumatic
malalignment.

Osteopenia.

Disc levels:

L2-L3:

Disc: Narrowing and bulging.

Facets: Arthropathy with spurring and ligament thickening

Canal: Mild stenosis, mainly from chronic retropulsion and ligament
thickening.

Foramina: Narrowing without suspected impingement

L3-L4:

Disc: Narrowing and bulging. Bulging is preferentially to the right
foraminal and far-lateral regions.

Facets: Arthropathy with spurring and ligament thickening.

Canal: Moderate stenosis, greatest in the lateral canal

Foramina: Stenosis on the right with L3 impingement due to disc
bulge primarily

L4-L5:

Disc: Mild narrowing and bulging

Facets: Arthropathy with marginal spurring and calcified ligamentous
thickening

Canal: Patent.

Foramina: No impingement.

L5-S1:

Disc: Mild narrowing and bulging

Facets: Facet arthropathy with calcified ligamentous thickening.

Canal: Patent.

Foramina: No impingement.
IMPRESSION: 1. No acute osseous finding. L3 and L4 compression fractures appear
chronic.
2. L3-4 degenerative right foraminal stenosis with L3 impingement
and moderate canal stenosis. No other explanation for right
radiculopathy.

## 2017-09-25 IMAGING — CR DG HIP (WITH OR WITHOUT PELVIS) 2-3V*R*
3 series · 3 of 3 positions shown · non-contrast
Comparison: None.

CLINICAL DATA: Right hip pain and right leg weakness.

EXAM:
DG HIP (WITH OR WITHOUT PELVIS) 2-3V RIGHT

[x pelvis]
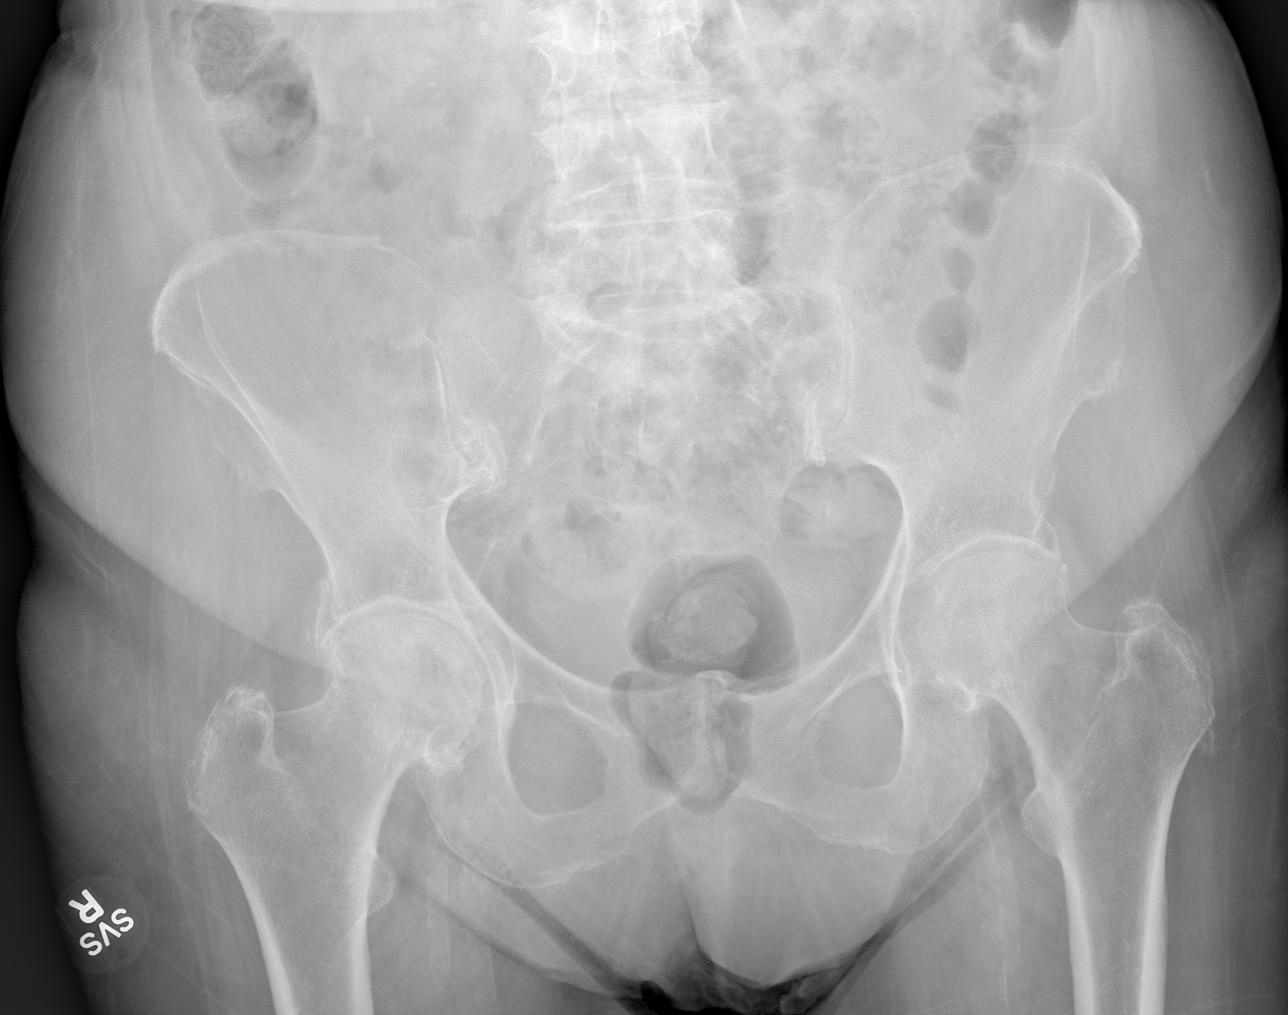

[x hip ap right]
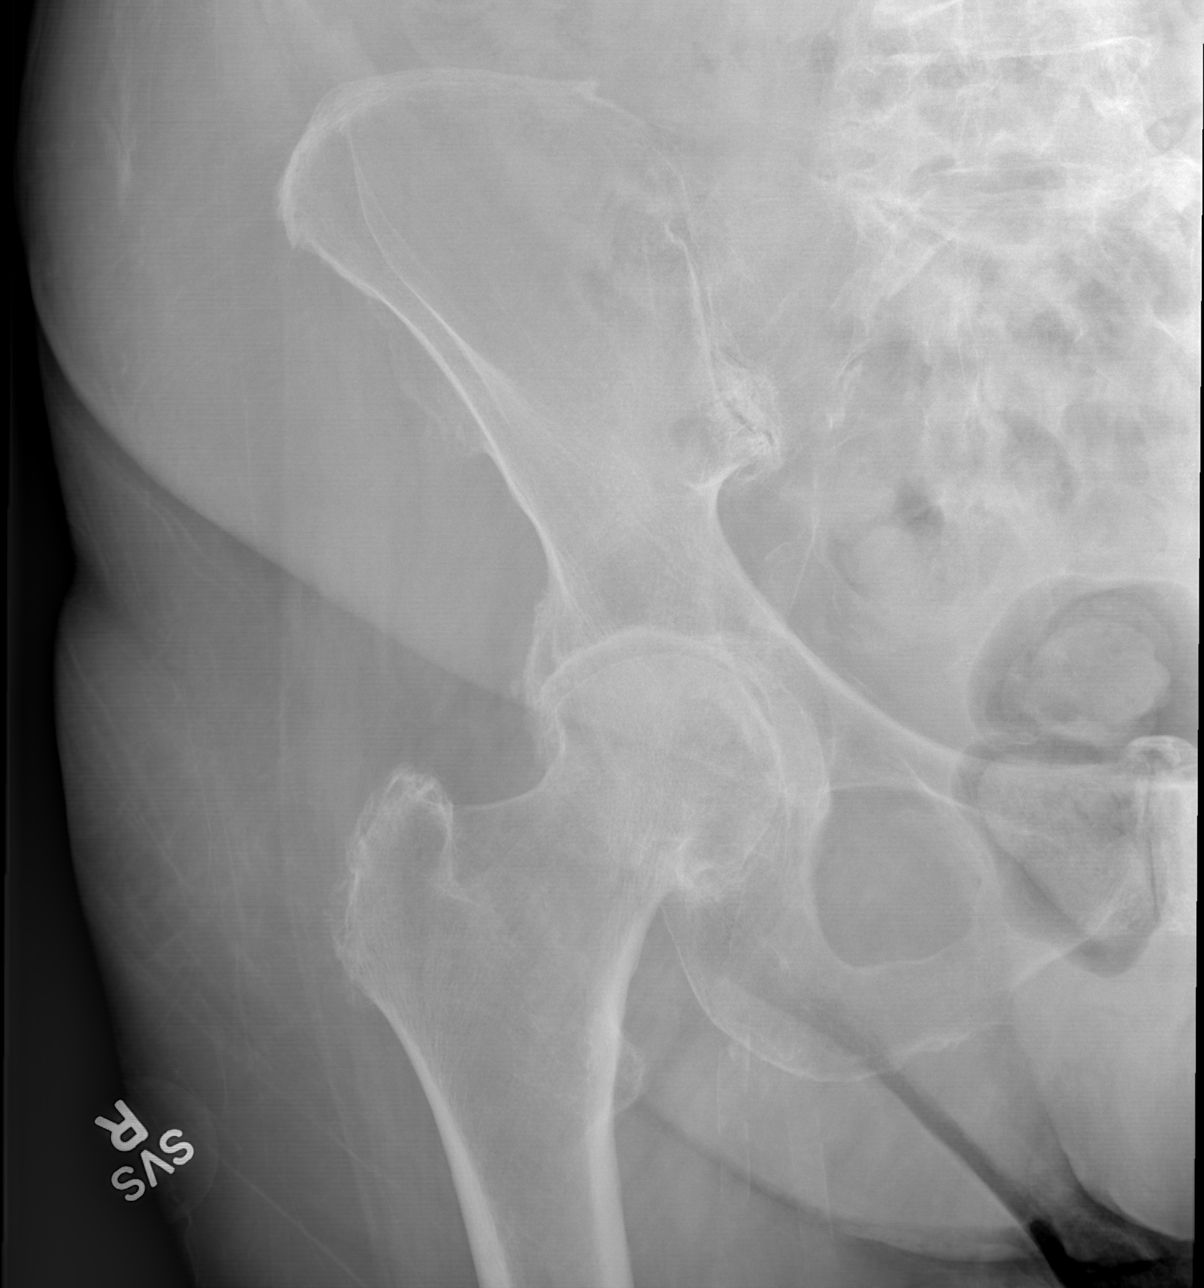

[x hip lat right]
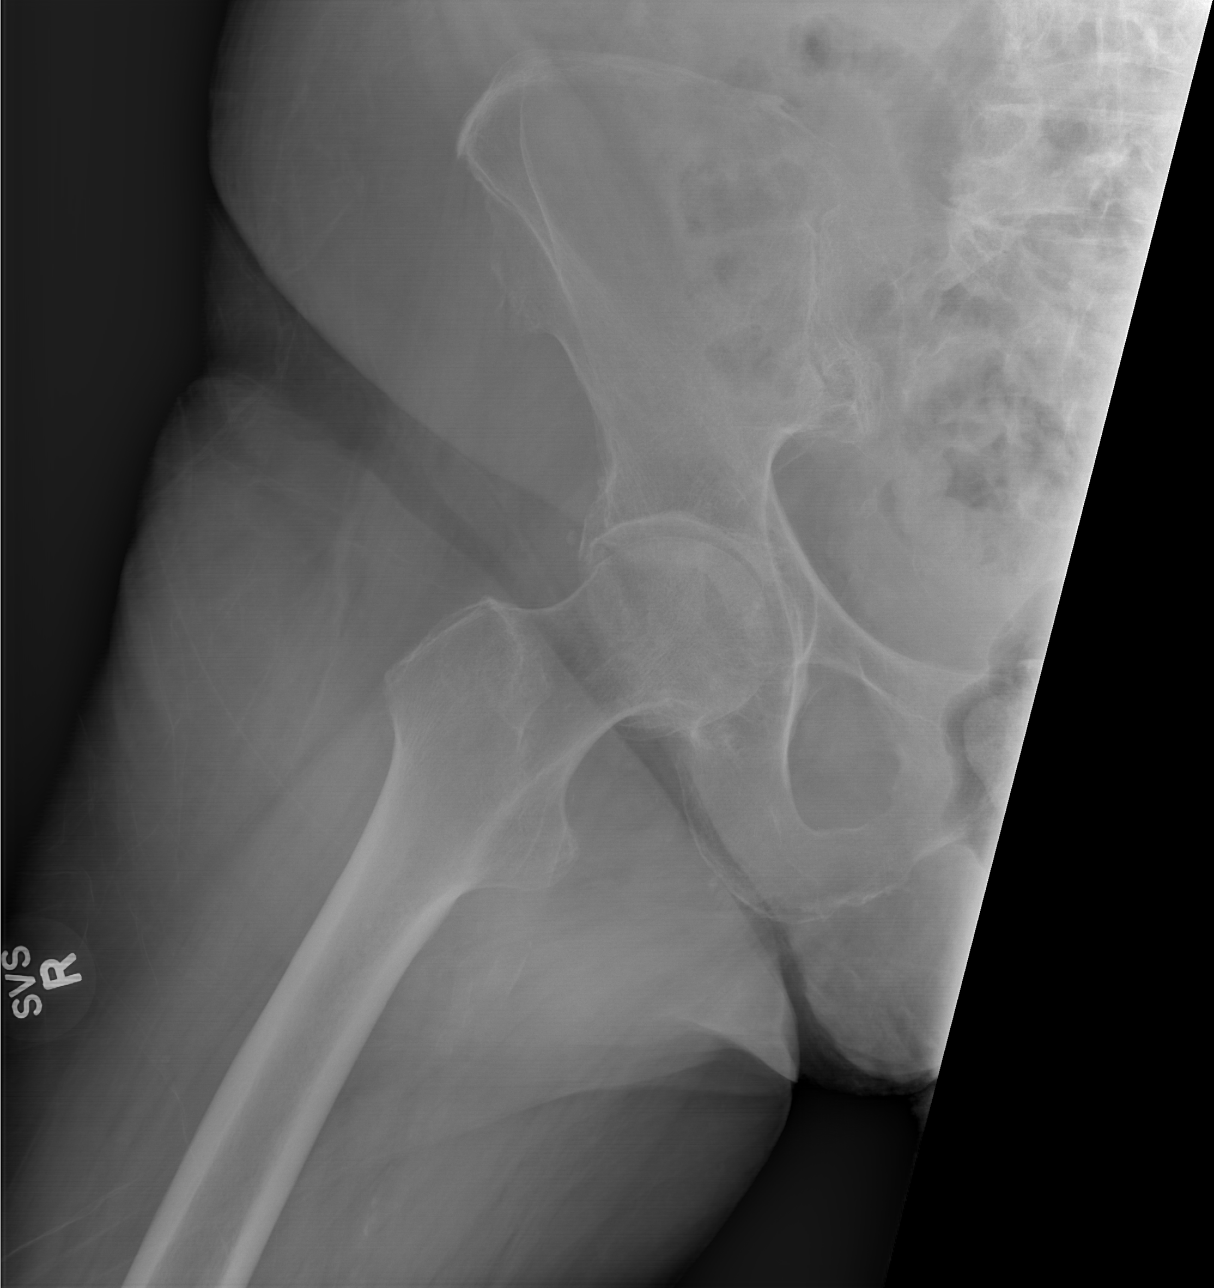

[3 of 3 positions shown; findings below may reference images not displayed]

FINDINGS: Diffuse osteopenia. Mild right hip degenerative changes. No fracture
or dislocation seen. Lower lumbar spine degenerative changes.
Atheromatous arterial calcifications.
IMPRESSION: 1. No acute abnormality.
2. Mild right hip degenerative changes.
3. Lower lumbar spine degenerative changes.

## 2018-07-07 ENCOUNTER — Ambulatory Visit (INDEPENDENT_AMBULATORY_CARE_PROVIDER_SITE_OTHER): Payer: Medicare Other

## 2018-07-07 ENCOUNTER — Encounter (HOSPITAL_COMMUNITY): Payer: Self-pay

## 2018-07-07 ENCOUNTER — Ambulatory Visit (HOSPITAL_COMMUNITY)
Admission: EM | Admit: 2018-07-07 | Discharge: 2018-07-07 | Disposition: A | Discharge status: Home / Self Care | Payer: Medicare Other | Attending: Emergency Medicine | Admitting: Emergency Medicine

## 2018-07-07 DIAGNOSIS — R05 Cough: Secondary | ICD-10-CM

## 2018-07-07 DIAGNOSIS — J22 Unspecified acute lower respiratory infection: Secondary | ICD-10-CM

## 2018-07-07 MED ORDER — AZITHROMYCIN 250 MG PO TABS: ORAL_TABLET | ORAL | 0 refills | Status: AC

## 2018-07-07 MED ORDER — BENZONATATE 100 MG PO CAPS: 100.0000 mg | ORAL_CAPSULE | Freq: Three times a day (TID) | ORAL | 0 refills | Status: DC

## 2018-07-07 NOTE — Discharge Instructions (Signed)
Complete course of antibiotics.   Push fluids to ensure adequate hydration and keep secretions thin.  Tylenol as needed for pain or fevers.   Tessalon as needed for cough, every 8 hours.  Please follow up with your primary care provider in the next 5 days for recheck. If develop increased weakness, shortness of breath, chest pain, fatigue, confusion, change in mentation, fevers, or otherwise worsening please return or go to Er.

## 2018-07-07 NOTE — ED Triage Notes (Signed)
Pt presents with cough x 5 days.

## 2018-07-07 NOTE — ED Provider Notes (Signed)
[facility]    CSN: [id number] Arrival date & time: 07/07/18  1001     History   Chief Complaint Chief Complaint  Patient presents with  . Cough    HPI Raven Danielslse Peterson is a 84 y.o. female.   Raven Peterson presents with her daughter with complaints of cough for the past week. Productive. Occasional chills. Shortness of breath. No chest pain . No ear pain. Mild sore throat. Decreased appetite. States had an episode of diarrhea earlier in the week as well as nausea. These have resolved. No abdominal pain. Resides at an assisted living. Had one resident hospitalized approximately 1 month ago due to pneumonia. No other known ill contacts. Hx of gerd and allergies, takes singulair as well as nexium. Usually these help with her mild chronic cough. No new weakness/confusion/disorientation. But has felt unwell and more tired, has been taking some meals in her room. This cough has been worse. Hx of stroke, is on blood thinner. No copd or asthma.    ROS per HPI.      Past Medical History:  Diagnosis Date  . Chronic back pain    "used to get shots in my back by a Pain Management doctor"  . Hypertension   . Stroke (HCC)   . Thyroid disease   . Vertebral compression fracture (HCC)     There are no active problems to display for this patient.   Past Surgical History:  Procedure Laterality Date  . EYE SURGERY      OB History   None      Home Medications    Prior to Admission medications   Medication Sig Start Date End Date Taking? Authorizing Provider  latanoprost (XALATAN) 0.005 % ophthalmic solution Place 1 drop into both eyes at bedtime.    Yes [provider]  levothyroxine (SYNTHROID, LEVOTHROID) 25 MCG tablet Take 25 mcg by mouth daily before breakfast.  07/13/15  Yes [provider]  lisinopril (PRINIVIL,ZESTRIL) 5 MG tablet Take 5 mg by mouth daily.    Yes [provider]  Alendronate Sodium (FOSAMAX PO) Take by mouth once a week.    [provider]  apixaban (ELIQUIS) 2.5 MG TABS tablet Take 2.5 mg by mouth 2 (two) times daily. Reported on 04/03/2016 06/16/15 06/15/16  [provider]  atorvastatin (LIPITOR) 40 MG tablet Take 40 mg by mouth daily.    [provider]  azithromycin (ZITHROMAX) 250 MG tablet Take 2 tablets (500 mg total) by mouth daily for 1 day, THEN 1 tablet (250 mg total) daily for 4 days. 07/07/18 07/12/18  Georgetta HaberBurky, Natalie B, NP  benzonatate (TESSALON) 100 MG capsule Take 1 capsule (100 mg total) by mouth every 8 (eight) hours. 07/07/18   Georgetta HaberBurky, Natalie B, NP  Bepotastine Besilate 1.5 % SOLN Apply 1 drop to eye every morning.     [provider]  brimonidine (ALPHAGAN P) 0.1 % SOLN Apply 1 drop to eye 2 (two) times daily.     [provider]  cephALEXin (KEFLEX) 500 MG capsule Take 1 capsule (500 mg total) by mouth 3 (three) times daily. 05/19/17   Azalia Bilisampos, Kevin, MD  esomeprazole (NEXIUM) 40 MG capsule Take 40 mg by mouth at bedtime.     [provider]  HYDROcodone-acetaminophen (NORCO/VICODIN) 5-325 MG tablet Take 1 tablet by mouth at bedtime.  04/02/15   [provider]  Lacosamide (VIMPAT) 100 MG TABS Take 100 mg by mouth 2 (two) times daily.  07/13/15 07/12/16  [provider]  montelukast (SINGULAIR) 10 MG tablet Take by mouth at bedtime.     [provider]    Family History Family History  Problem Relation Age of Onset  . Healthy Mother   . Asthma Father     Social History Social History   Tobacco Use  . Smoking status: Never Smoker  Substance Use Topics  . Alcohol use: No  . Drug use: No     Allergies   Cimetidine; Naproxen sodium; Iodinated diagnostic agents; and Other   Review of Systems Review of Systems   Physical Exam Triage Vital Signs ED Triage Vitals [07/07/18 1018]  Enc Vitals Group     BP (!) 151/49     Pulse Rate 88     Resp 18     Temp 98 F (36.7 C)     Temp src      SpO2 100 %     Weight      Height       Head Circumference      Peak Flow      Pain Score 3     Pain Loc      Pain Edu?      Excl. in GC?    No data found.  Updated Vital Signs BP (!) 151/49   Pulse 88   Temp 98 F (36.7 C)   Resp 18   SpO2 100%    Physical Exam  Constitutional: She is oriented to person, place, and time. She appears well-developed and well-nourished. No distress.  HENT:  Head: Normocephalic and atraumatic.  Right Ear: Tympanic membrane, external ear and ear canal normal.  Left Ear: Tympanic membrane, external ear and ear canal normal.  Nose: Nose normal.  Mouth/Throat: Uvula is midline, oropharynx is clear and moist and mucous membranes are normal. No tonsillar exudate.  Eyes: Pupils are equal, round, and reactive to light. Conjunctivae and EOM are normal.  Cardiovascular: Normal rate, regular rhythm and normal heart sounds.  Pulmonary/Chest: Effort normal. No accessory muscle usage. No tachypnea. No respiratory distress. She has rales in the right lower field.  Faint rales noted to rll. Strong frequent cough noted   Neurological: She is alert and oriented to person, place, and time.  Skin: Skin is warm and dry.     UC Treatments / Results  Labs (all labs ordered are listed, but only abnormal results are displayed) Labs Reviewed - No data to display  EKG None  Radiology Dg Chest 2 View  Result Date: 07/07/2018 CLINICAL DATA:  Cough, dyspnea EXAM: CHEST - 2 VIEW COMPARISON:  None. FINDINGS: Top-normal heart size. Atherosclerotic minimally tortuous thoracic aorta. Otherwise normal mediastinal contour. No pneumothorax. No pleural effusion. No overt pulmonary edema. No acute consolidative airspace disease. IMPRESSION: No active cardiopulmonary disease. Electronically Signed   By: Delbert Phenix M.D.   On: 07/07/2018 11:01    Procedures Procedures (including critical care time)  Medications Ordered in UC Medications - No data to display  Initial Impression / Assessment and Plan / UC  Course  I have reviewed the triage vital signs and the nursing notes.  Pertinent labs & imaging results that were available during my care of the patient were reviewed by me and considered in my medical decision making (see chart for details).     Chest xray without acute findings. Vitals reassuring. No increased work of breathing. No weakness or mentation changes. Will cover with azithromycin, tessalon as needed. Encouraged close follow up for recheck  with PCP. Return precautions provided. Patient and daughter verbalized understanding and agreeable to plan.   Final Clinical Impressions(s) / UC Diagnoses   Final diagnoses:  Lower respiratory infection     Discharge Instructions     Complete course of antibiotics.   Push fluids to ensure adequate hydration and keep secretions thin.  Tylenol as needed for pain or fevers.   Tessalon as needed for cough, every 8 hours.  Please follow up with your primary care provider in the next 5 days for recheck. If develop increased weakness, shortness of breath, chest pain, fatigue, confusion, change in mentation, fevers, or otherwise worsening please return or go to Er.     ED Prescriptions    Medication Sig Dispense Auth. Provider   azithromycin (ZITHROMAX) 250 MG tablet Take 2 tablets (500 mg total) by mouth daily for 1 day, THEN 1 tablet (250 mg total) daily for 4 days. 6 tablet Linus Mako B, NP   benzonatate (TESSALON) 100 MG capsule Take 1 capsule (100 mg total) by mouth every 8 (eight) hours. 21 capsule Georgetta Haber, NP     Controlled Substance Prescriptions [place] Controlled Substance Registry consulted? Not Applicable   Georgetta Haber, NP 07/07/18 1113

## 2018-07-13 ENCOUNTER — Encounter (HOSPITAL_COMMUNITY): Payer: Self-pay

## 2018-07-13 ENCOUNTER — Emergency Department (HOSPITAL_COMMUNITY)
Admission: EM | Admit: 2018-07-13 | Discharge: 2018-07-13 | Disposition: A | Discharge status: Home / Self Care | Payer: Medicare Other | Attending: Emergency Medicine | Admitting: Emergency Medicine

## 2018-07-13 ENCOUNTER — Other Ambulatory Visit: Payer: Self-pay

## 2018-07-13 ENCOUNTER — Emergency Department (HOSPITAL_COMMUNITY): Payer: Medicare Other

## 2018-07-13 DIAGNOSIS — S0990XA Unspecified injury of head, initial encounter: Secondary | ICD-10-CM | POA: Diagnosis present

## 2018-07-13 DIAGNOSIS — W03XXXA Other fall on same level due to collision with another person, initial encounter: Secondary | ICD-10-CM | POA: Diagnosis not present

## 2018-07-13 DIAGNOSIS — Y939 Activity, unspecified: Secondary | ICD-10-CM | POA: Diagnosis not present

## 2018-07-13 DIAGNOSIS — W19XXXA Unspecified fall, initial encounter: Secondary | ICD-10-CM

## 2018-07-13 DIAGNOSIS — I10 Essential (primary) hypertension: Secondary | ICD-10-CM | POA: Diagnosis not present

## 2018-07-13 DIAGNOSIS — Y999 Unspecified external cause status: Secondary | ICD-10-CM | POA: Diagnosis not present

## 2018-07-13 DIAGNOSIS — E079 Disorder of thyroid, unspecified: Secondary | ICD-10-CM | POA: Insufficient documentation

## 2018-07-13 DIAGNOSIS — Y92129 Unspecified place in nursing home as the place of occurrence of the external cause: Secondary | ICD-10-CM | POA: Insufficient documentation

## 2018-07-13 DIAGNOSIS — Z79899 Other long term (current) drug therapy: Secondary | ICD-10-CM | POA: Diagnosis not present

## 2018-07-13 DIAGNOSIS — S32591A Other specified fracture of right pubis, initial encounter for closed fracture: Secondary | ICD-10-CM | POA: Diagnosis not present

## 2018-07-13 MED ORDER — ACETAMINOPHEN 325 MG PO TABS
650.0000 mg | ORAL_TABLET | Freq: Once | ORAL | Status: AC
  Administered 2018-07-13: 650 mg via ORAL
  Filled 2018-07-13: qty 2
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
## 2018-07-13 NOTE — ED Notes (Signed)
[facility]tar called for transport , called Nursing home twice to give report but no one answered
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
## 2018-07-13 NOTE — ED Notes (Signed)
Patient transported to X-ray 

## 2018-07-13 NOTE — Discharge Instructions (Signed)
Patient with inferior pubic ramus fracture seen on x-Raven Peterson today.  She is referred to orthopedic surgery for follow-up.  She can walk with walker or assistance.  He should continue Tylenol for pain.

## 2019-05-30 ENCOUNTER — Inpatient Hospital Stay (HOSPITAL_COMMUNITY)
Admission: EM | Admit: 2019-05-30 | Discharge: 2019-06-03 | DRG: 388 | Disposition: A | Discharge status: Skilled Nursing Facility | Payer: Medicare Other | Attending: Internal Medicine | Admitting: Internal Medicine

## 2019-05-30 ENCOUNTER — Other Ambulatory Visit: Payer: Self-pay

## 2019-05-30 ENCOUNTER — Emergency Department (HOSPITAL_COMMUNITY): Payer: Medicare Other

## 2019-05-30 ENCOUNTER — Inpatient Hospital Stay (HOSPITAL_COMMUNITY): Payer: Medicare Other

## 2019-05-30 ENCOUNTER — Encounter (HOSPITAL_COMMUNITY): Payer: Self-pay

## 2019-05-30 DIAGNOSIS — J810 Acute pulmonary edema: Secondary | ICD-10-CM | POA: Diagnosis not present

## 2019-05-30 DIAGNOSIS — K56600 Partial intestinal obstruction, unspecified as to cause: Principal | ICD-10-CM | POA: Diagnosis present

## 2019-05-30 DIAGNOSIS — R079 Chest pain, unspecified: Secondary | ICD-10-CM | POA: Diagnosis not present

## 2019-05-30 DIAGNOSIS — Z8673 Personal history of transient ischemic attack (TIA), and cerebral infarction without residual deficits: Secondary | ICD-10-CM

## 2019-05-30 DIAGNOSIS — Z79890 Hormone replacement therapy (postmenopausal): Secondary | ICD-10-CM | POA: Diagnosis not present

## 2019-05-30 DIAGNOSIS — Z66 Do not resuscitate: Secondary | ICD-10-CM | POA: Diagnosis not present

## 2019-05-30 DIAGNOSIS — R0600 Dyspnea, unspecified: Secondary | ICD-10-CM

## 2019-05-30 DIAGNOSIS — Z825 Family history of asthma and other chronic lower respiratory diseases: Secondary | ICD-10-CM

## 2019-05-30 DIAGNOSIS — K56609 Unspecified intestinal obstruction, unspecified as to partial versus complete obstruction: Secondary | ICD-10-CM

## 2019-05-30 DIAGNOSIS — Z9989 Dependence on other enabling machines and devices: Secondary | ICD-10-CM | POA: Diagnosis not present

## 2019-05-30 DIAGNOSIS — R011 Cardiac murmur, unspecified: Secondary | ICD-10-CM | POA: Diagnosis not present

## 2019-05-30 DIAGNOSIS — G40909 Epilepsy, unspecified, not intractable, without status epilepticus: Secondary | ICD-10-CM | POA: Diagnosis present

## 2019-05-30 DIAGNOSIS — G8929 Other chronic pain: Secondary | ICD-10-CM | POA: Diagnosis present

## 2019-05-30 DIAGNOSIS — Z7901 Long term (current) use of anticoagulants: Secondary | ICD-10-CM | POA: Diagnosis not present

## 2019-05-30 DIAGNOSIS — I5031 Acute diastolic (congestive) heart failure: Secondary | ICD-10-CM | POA: Diagnosis not present

## 2019-05-30 DIAGNOSIS — I10 Essential (primary) hypertension: Secondary | ICD-10-CM | POA: Diagnosis not present

## 2019-05-30 DIAGNOSIS — E785 Hyperlipidemia, unspecified: Secondary | ICD-10-CM

## 2019-05-30 DIAGNOSIS — K219 Gastro-esophageal reflux disease without esophagitis: Secondary | ICD-10-CM | POA: Diagnosis not present

## 2019-05-30 DIAGNOSIS — Z0189 Encounter for other specified special examinations: Secondary | ICD-10-CM

## 2019-05-30 DIAGNOSIS — I110 Hypertensive heart disease with heart failure: Secondary | ICD-10-CM | POA: Diagnosis present

## 2019-05-30 DIAGNOSIS — R0603 Acute respiratory distress: Secondary | ICD-10-CM | POA: Diagnosis not present

## 2019-05-30 DIAGNOSIS — Z9049 Acquired absence of other specified parts of digestive tract: Secondary | ICD-10-CM | POA: Diagnosis not present

## 2019-05-30 DIAGNOSIS — J9601 Acute respiratory failure with hypoxia: Secondary | ICD-10-CM | POA: Diagnosis not present

## 2019-05-30 DIAGNOSIS — K567 Ileus, unspecified: Secondary | ICD-10-CM | POA: Diagnosis present

## 2019-05-30 DIAGNOSIS — Z79891 Long term (current) use of opiate analgesic: Secondary | ICD-10-CM | POA: Diagnosis not present

## 2019-05-30 DIAGNOSIS — M549 Dorsalgia, unspecified: Secondary | ICD-10-CM | POA: Diagnosis not present

## 2019-05-30 DIAGNOSIS — Z1159 Encounter for screening for other viral diseases: Secondary | ICD-10-CM | POA: Diagnosis not present

## 2019-05-30 DIAGNOSIS — R7989 Other specified abnormal findings of blood chemistry: Secondary | ICD-10-CM | POA: Diagnosis not present

## 2019-05-30 DIAGNOSIS — R778 Other specified abnormalities of plasma proteins: Secondary | ICD-10-CM

## 2019-05-30 DIAGNOSIS — I248 Other forms of acute ischemic heart disease: Secondary | ICD-10-CM | POA: Diagnosis not present

## 2019-05-30 DIAGNOSIS — I700 Atherosclerosis of aorta: Secondary | ICD-10-CM

## 2019-05-30 DIAGNOSIS — R9431 Abnormal electrocardiogram [ECG] [EKG]: Secondary | ICD-10-CM | POA: Diagnosis present

## 2019-05-30 DIAGNOSIS — I5033 Acute on chronic diastolic (congestive) heart failure: Secondary | ICD-10-CM

## 2019-05-30 DIAGNOSIS — I639 Cerebral infarction, unspecified: Secondary | ICD-10-CM | POA: Diagnosis not present

## 2019-05-30 DIAGNOSIS — Z79899 Other long term (current) drug therapy: Secondary | ICD-10-CM

## 2019-05-30 DIAGNOSIS — E039 Hypothyroidism, unspecified: Secondary | ICD-10-CM

## 2019-05-30 HISTORY — DX: Ileus, unspecified: K56.7

## 2019-05-30 HISTORY — DX: Unspecified intestinal obstruction, unspecified as to partial versus complete obstruction: K56.609

## 2019-05-30 HISTORY — DX: Other specified fracture of right pubis, subsequent encounter for fracture with routine healing: S32.591D

## 2019-05-30 LAB — CBC WITH DIFFERENTIAL/PLATELET
Abs Immature Granulocytes: 0.02 10*3/uL (ref 0.00–0.07)
Basophils Absolute: 0.1 10*3/uL (ref 0.0–0.1)
Basophils Relative: 1 %
Eosinophils Absolute: 0.1 10*3/uL (ref 0.0–0.5)
Eosinophils Relative: 1 %
HCT: 49.2 % — ABNORMAL HIGH (ref 36.0–46.0)
Hemoglobin: 16.3 g/dL — ABNORMAL HIGH (ref 12.0–15.0)
Immature Granulocytes: 0 %
Lymphocytes Relative: 15 %
Lymphs Abs: 1.6 10*3/uL (ref 0.7–4.0)
MCH: 32 pg (ref 26.0–34.0)
MCHC: 33.1 g/dL (ref 30.0–36.0)
MCV: 96.5 fL (ref 80.0–100.0)
Monocytes Absolute: 0.8 10*3/uL (ref 0.1–1.0)
Monocytes Relative: 7 %
Neutro Abs: 8.3 10*3/uL — ABNORMAL HIGH (ref 1.7–7.7)
Neutrophils Relative %: 76 %
Platelets: 255 10*3/uL (ref 150–400)
RBC: 5.1 MIL/uL (ref 3.87–5.11)
RDW: 12.6 % (ref 11.5–15.5)
WBC: 10.8 10*3/uL — ABNORMAL HIGH (ref 4.0–10.5)
nRBC: 0 % (ref 0.0–0.2)

## 2019-05-30 LAB — COMPREHENSIVE METABOLIC PANEL
ALT: 15 U/L (ref 0–44)
AST: 18 U/L (ref 15–41)
Albumin: 4.3 g/dL (ref 3.5–5.0)
Alkaline Phosphatase: 91 U/L (ref 38–126)
Anion gap: 10 (ref 5–15)
BUN: 16 mg/dL (ref 8–23)
CO2: 24 mmol/L (ref 22–32)
Calcium: 9.2 mg/dL (ref 8.9–10.3)
Chloride: 104 mmol/L (ref 98–111)
Creatinine, Ser: 0.81 mg/dL (ref 0.44–1.00)
GFR calc Af Amer: >60 mL/min (ref >60)
GFR calc non Af Amer: >60 mL/min (ref >60)
Glucose, Bld: 103 mg/dL — ABNORMAL HIGH (ref 70–99)
Potassium: 3.8 mmol/L (ref 3.5–5.1)
Sodium: 138 mmol/L (ref 135–145)
Total Bilirubin: 0.9 mg/dL (ref 0.3–1.2)
Total Protein: 7.8 g/dL (ref 6.5–8.1)

## 2019-05-30 LAB — LIPID PANEL
Cholesterol: 156 mg/dL (ref 0–200)
HDL: 52 mg/dL (ref >40)
LDL Cholesterol: 83 mg/dL (ref 0–99)
Total CHOL/HDL Ratio: 3 RATIO
Triglycerides: 107 mg/dL (ref <150)
VLDL: 21 mg/dL (ref 0–40)

## 2019-05-30 LAB — URINALYSIS, ROUTINE W REFLEX MICROSCOPIC
Bilirubin Urine: NEGATIVE
Glucose, UA: NEGATIVE mg/dL
Ketones, ur: NEGATIVE mg/dL
Nitrite: NEGATIVE
Protein, ur: NEGATIVE mg/dL
Specific Gravity, Urine: 1.008 (ref 1.005–1.030)
pH: 5 (ref 5.0–8.0)

## 2019-05-30 LAB — LIPASE, BLOOD: Lipase: 40 U/L (ref 11–51)

## 2019-05-30 LAB — SARS CORONAVIRUS 2 BY RT PCR (HOSPITAL ORDER, PERFORMED IN ~~LOC~~ HOSPITAL LAB): SARS Coronavirus 2: NEGATIVE

## 2019-05-30 LAB — TROPONIN I
Troponin I: 0.1 ng/mL (ref <0.03)
Troponin I: 0.11 ng/mL (ref <0.03)

## 2019-05-30 MED ORDER — ONDANSETRON HCL 4 MG/2ML IJ SOLN: 4.0000 mg | Freq: Four times a day (QID) | INTRAMUSCULAR | Status: DC | PRN

## 2019-05-30 MED ORDER — SODIUM CHLORIDE 0.9 % IV SOLN
INTRAVENOUS | Status: DC
  Administered 2019-05-30 – 2019-06-01 (×2): via INTRAVENOUS

## 2019-05-30 MED ORDER — HYDROCORTISONE 1 % EX CREA: 1.0000 | TOPICAL_CREAM | Freq: Three times a day (TID) | CUTANEOUS | Status: DC | PRN

## 2019-05-30 MED ORDER — ACETAMINOPHEN 325 MG PO TABS: 650.0000 mg | ORAL_TABLET | ORAL | Status: DC | PRN

## 2019-05-30 MED ORDER — LACTATED RINGERS IV BOLUS: 1000.0000 mL | Freq: Three times a day (TID) | INTRAVENOUS | Status: DC | PRN

## 2019-05-30 MED ORDER — PANTOPRAZOLE SODIUM 40 MG PO TBEC
40.0000 mg | DELAYED_RELEASE_TABLET | Freq: Every day | ORAL | Status: DC
  Administered 2019-05-31 – 2019-06-03 (×4): 40 mg via ORAL
  Filled 2019-05-30 (×4): qty 1

## 2019-05-30 MED ORDER — HEPARIN SODIUM (PORCINE) 5000 UNIT/ML IJ SOLN
5000.0000 [IU] | Freq: Three times a day (TID) | INTRAMUSCULAR | Status: DC
  Administered 2019-05-30 – 2019-06-03 (×11): 5000 [IU] via SUBCUTANEOUS
  Filled 2019-05-30 (×11): qty 1

## 2019-05-30 MED ORDER — BISACODYL 10 MG RE SUPP
10.0000 mg | Freq: Every day | RECTAL | Status: DC
  Filled 2019-05-30 (×3): qty 1

## 2019-05-30 MED ORDER — ASPIRIN 81 MG PO CHEW
324.0000 mg | CHEWABLE_TABLET | Freq: Once | ORAL | Status: AC
  Administered 2019-05-30: 324 mg via ORAL
  Filled 2019-05-30: qty 4

## 2019-05-30 MED ORDER — MONTELUKAST SODIUM 10 MG PO TABS
10.0000 mg | ORAL_TABLET | Freq: Every day | ORAL | Status: DC
  Administered 2019-05-30 – 2019-06-02 (×4): 10 mg via ORAL
  Filled 2019-05-30 (×4): qty 1

## 2019-05-30 MED ORDER — MAGIC MOUTHWASH
15.0000 mL | Freq: Four times a day (QID) | ORAL | Status: DC | PRN
  Administered 2019-05-31: 15 mL via ORAL
  Filled 2019-05-30 (×3): qty 15

## 2019-05-30 MED ORDER — PHENOL 1.4 % MT LIQD: 1.0000 | OROMUCOSAL | Status: DC | PRN

## 2019-05-30 MED ORDER — MORPHINE SULFATE (PF) 2 MG/ML IV SOLN
2.0000 mg | INTRAVENOUS | Status: DC | PRN
  Filled 2019-05-30: qty 1

## 2019-05-30 MED ORDER — LEVOTHYROXINE SODIUM 25 MCG PO TABS
25.0000 ug | ORAL_TABLET | Freq: Every day | ORAL | Status: DC
  Administered 2019-05-31 – 2019-06-03 (×4): 25 ug via ORAL
  Filled 2019-05-30 (×4): qty 1

## 2019-05-30 MED ORDER — LACTATED RINGERS IV BOLUS: 1000.0000 mL | Freq: Once | INTRAVENOUS | Status: AC

## 2019-05-30 MED ORDER — PROCHLORPERAZINE EDISYLATE 10 MG/2ML IJ SOLN
5.0000 mg | INTRAMUSCULAR | Status: DC | PRN
Start: 2019-05-30 — End: 2019-06-03

## 2019-05-30 MED ORDER — LIP MEDEX EX OINT
1.0000 "application " | TOPICAL_OINTMENT | Freq: Two times a day (BID) | CUTANEOUS | Status: DC
  Administered 2019-05-30 – 2019-06-03 (×7): 1 via TOPICAL
  Filled 2019-05-30 (×2): qty 7

## 2019-05-30 MED ORDER — SODIUM CHLORIDE 0.9 % IV SOLN
8.0000 mg | Freq: Four times a day (QID) | INTRAVENOUS | Status: DC | PRN
  Filled 2019-05-30: qty 4

## 2019-05-30 MED ORDER — MENTHOL 3 MG MT LOZG: 1.0000 | LOZENGE | OROMUCOSAL | Status: DC | PRN

## 2019-05-30 MED ORDER — BENZONATATE 100 MG PO CAPS
100.0000 mg | ORAL_CAPSULE | Freq: Three times a day (TID) | ORAL | Status: DC
  Administered 2019-05-30 – 2019-06-03 (×10): 100 mg via ORAL
  Filled 2019-05-30 (×10): qty 1

## 2019-05-30 MED ORDER — SODIUM CHLORIDE 0.9 % IV SOLN
25.0000 mg | Freq: Four times a day (QID) | INTRAVENOUS | Status: DC | PRN
  Filled 2019-05-30: qty 1

## 2019-05-30 MED ORDER — LISINOPRIL 5 MG PO TABS
5.0000 mg | ORAL_TABLET | Freq: Every day | ORAL | Status: DC
  Administered 2019-05-31 – 2019-06-03 (×4): 5 mg via ORAL
  Filled 2019-05-30 (×3): qty 1
  Filled 2019-05-30: qty 2

## 2019-05-30 MED ORDER — ALUM & MAG HYDROXIDE-SIMETH 200-200-20 MG/5ML PO SUSP: 30.0000 mL | Freq: Four times a day (QID) | ORAL | Status: DC | PRN

## 2019-05-30 MED ORDER — LATANOPROST 0.005 % OP SOLN
1.0000 [drp] | Freq: Every day | OPHTHALMIC | Status: DC
  Administered 2019-05-30 – 2019-06-01 (×3): 1 [drp] via OPHTHALMIC
  Filled 2019-05-30: qty 2.5

## 2019-05-30 MED ORDER — ATORVASTATIN CALCIUM 40 MG PO TABS
40.0000 mg | ORAL_TABLET | Freq: Every day | ORAL | Status: DC
  Administered 2019-05-31 – 2019-06-03 (×4): 40 mg via ORAL
  Filled 2019-05-30 (×4): qty 1

## 2019-05-30 MED ORDER — POLYVINYL ALCOHOL 1.4 % OP SOLN
1.0000 [drp] | Freq: Every day | OPHTHALMIC | Status: DC
  Administered 2019-05-31 – 2019-06-03 (×3): 1 [drp] via OPHTHALMIC
  Filled 2019-05-30: qty 15

## 2019-05-30 MED ORDER — CARBOXYMETHYLCELLULOSE SODIUM 0.5 % OP SOLN: 1.0000 [drp] | Freq: Every morning | OPHTHALMIC | Status: DC

## 2019-05-30 MED ORDER — ONDANSETRON HCL 4 MG/2ML IJ SOLN
4.0000 mg | Freq: Four times a day (QID) | INTRAMUSCULAR | Status: DC | PRN
  Administered 2019-05-30 – 2019-05-31 (×2): 4 mg via INTRAVENOUS
  Filled 2019-05-30 (×3): qty 2

## 2019-05-30 MED ORDER — HYDROCORTISONE (PERIANAL) 2.5 % EX CREA: 1.0000 "application " | TOPICAL_CREAM | Freq: Four times a day (QID) | CUTANEOUS | Status: DC | PRN

## 2019-05-30 MED ORDER — GUAIFENESIN-DM 100-10 MG/5ML PO SYRP
10.0000 mL | ORAL_SOLUTION | ORAL | Status: DC | PRN
  Administered 2019-06-01: 10 mL via ORAL
  Filled 2019-05-30: qty 10

## 2019-05-30 MED ORDER — ONDANSETRON HCL 4 MG/2ML IJ SOLN
4.0000 mg | Freq: Once | INTRAMUSCULAR | Status: AC
  Administered 2019-05-30: 4 mg via INTRAVENOUS
  Filled 2019-05-30: qty 2

## 2019-05-30 MED ORDER — DIATRIZOATE MEGLUMINE & SODIUM 66-10 % PO SOLN
90.0000 mL | Freq: Once | ORAL | Status: AC
  Administered 2019-05-30: 90 mL via NASOGASTRIC
  Filled 2019-05-30: qty 90

## 2019-05-30 MED ORDER — LACOSAMIDE 50 MG PO TABS
100.0000 mg | ORAL_TABLET | Freq: Two times a day (BID) | ORAL | Status: DC
  Administered 2019-05-30 – 2019-06-03 (×8): 100 mg via ORAL
  Filled 2019-05-30 (×8): qty 2

## 2019-05-30 MED ORDER — PROMETHAZINE HCL 25 MG RE SUPP
12.5000 mg | Freq: Four times a day (QID) | RECTAL | Status: DC | PRN
  Filled 2019-05-30: qty 1

## 2019-05-30 MED ORDER — LABETALOL HCL 5 MG/ML IV SOLN
5.0000 mg | INTRAVENOUS | Status: DC | PRN
  Administered 2019-06-02: 5 mg via INTRAVENOUS
  Filled 2019-05-30 (×2): qty 4

## 2019-05-30 MED ORDER — LACTATED RINGERS IV BOLUS: 1000.0000 mL | Freq: Once | INTRAVENOUS | Status: DC

## 2019-05-30 MED ORDER — ASPIRIN EC 81 MG PO TBEC
81.0000 mg | DELAYED_RELEASE_TABLET | Freq: Every day | ORAL | Status: DC
  Administered 2019-05-31 – 2019-06-03 (×4): 81 mg via ORAL
  Filled 2019-05-30 (×4): qty 1

## 2019-05-30 MED ORDER — METHOCARBAMOL 500 MG PO TABS: 500.0000 mg | ORAL_TABLET | Freq: Two times a day (BID) | ORAL | Status: DC | PRN

## 2019-05-30 NOTE — ED Provider Notes (Addendum)
[facility]-EMERGENCY DEPT Provider Note   CSN: [id number] Arrival date & time: 05/30/19  1256    History   Chief Complaint Chief Complaint  Patient presents with   Abdominal Pain   Emesis    HPI Raven Peterson is a 85 y.o. female.     HPI   85 year old female with a history of CVA, thyroid disease, hypertension, chronic back pain, cholecystectomy, presents with concern for abdominal pain.  Patient reports that it began at 8 PM last night after she had dinner.  Reports its a dull pain across her epigastrium.  There is no other radiation of pain although notes some mild radiation to the chest. Describes it as a twinge and burning. Has had it before related to eating certain things but it would get better and this did not seem to be related to eating anything in particular. No chest pain now.  Reports associated nausea.  Denies vomiting, fevers, cough, shortness of breath.  Reports that she used to have diarrhea, however has been constipated with no bowel movements over the last 2 days.  Reports she is also not passed flatus.  Denies urinary symptoms.  Past Medical History:  Diagnosis Date   Acute ischemic stroke (HCC) 10/02/2014   right cerebellar and left posterior temporal CVA: This was confirmed on MRI. We suspected an embolic source given the distribution on MRI. She has no history of atrial fib and no atrial fib was seen on telemetry. She was seen by neurology in consultation and underwent carotid US which showed mild right carotid stenosis (<50%) and moderate left carotid stenosis (50-69%). She was treated with aspiri   CAP (community acquired pneumonia) 03/17/2015   Chronic back pain    "used to get shots in my back by a Pain Management doctor"   Hypertension    Inferior pubic ramus fracture, right, with routine healing, subsequent encounter    Seizure (HCC) 10/02/2014   Thyroid disease    UTI from E.coli 2018   Vertebral compression fracture [facility](HCC)      Patient Active Problem List   Diagnosis Date Noted   Chronic anticoagulation 05/30/2019   Hypothyroidism 05/30/2019   Hyperlipidemia 05/30/2019   Atherosclerosis of abdominal aorta (HCC) 05/30/2019   Ileus (HCC) 05/30/2019   SBO (small bowel obstruction) (HCC) 05/30/2019   Elevated troponin level 05/30/2019   Dependent on walker for ambulation 05/30/2019   Essential hypertension 10/02/2014   GERD (gastroesophageal reflux disease) 10/02/2014   Glaucoma 10/02/2014   History of stroke 05/29/2014    Past Surgical History:  Procedure Laterality Date   CATARACT EXTRACTION, BILATERAL     CHOLECYSTECTOMY       OB History   No obstetric history on file.      Home Medications    Prior to Admission medications   Medication Sig Start Date End Date Taking? Authorizing Provider  acetaminophen (TYLENOL) 500 MG tablet Take 500 mg by mouth 2 (two) times daily as needed for mild pain.   Yes [provider]  apixaban (ELIQUIS) 2.5 MG TABS tablet Take 2.5 mg by mouth 2 (two) times daily. Reported on 04/03/2016 06/16/15 05/30/19 Yes [provider]  benzonatate (TESSALON) 100 MG capsule Take 1 capsule (100 mg total) by mouth every 8 (eight) hours. 07/07/18  Yes Burky, Dorene GrebeNatalie B, NP  carboxymethylcellulose (REFRESH PLUS) 0.5 % SOLN Place 1 drop into both eyes every morning.   Yes [provider]  HYDROcodone-acetaminophen (NORCO/VICODIN) 5-325 MG tablet Take 1 tablet by mouth  3 (three) times daily as needed for moderate pain.  04/02/15  Yes [provider]  Lacosamide (VIMPAT) 100 MG TABS Take 100 mg by mouth 2 (two) times daily.  07/13/15 05/30/19 Yes [provider]  latanoprost (XALATAN) 0.005 % ophthalmic solution Place 1 drop into both eyes at bedtime.    Yes [provider]  levothyroxine (SYNTHROID, LEVOTHROID) 25 MCG tablet Take 25 mcg by mouth daily before breakfast.  07/13/15  Yes [provider]  lisinopril  (PRINIVIL,ZESTRIL) 5 MG tablet Take 5 mg by mouth daily.    Yes [provider]  methocarbamol (ROBAXIN) 500 MG tablet Take 500 mg by mouth 2 (two) times daily as needed for muscle spasms.   Yes [provider]  montelukast (SINGULAIR) 10 MG tablet Take by mouth at bedtime.    Yes [provider]  omeprazole (PRILOSEC) 20 MG capsule Take 20 mg by mouth daily.   Yes [provider]  atorvastatin (LIPITOR) 40 MG tablet Take 40 mg by mouth daily.    [provider]    Family History Family History  Problem Relation Age of Onset   Healthy Mother    Asthma Father     Social History Social History   Tobacco Use   Smoking status: Never Smoker   Smokeless tobacco: Never Used  Substance Use Topics   Alcohol use: No   Drug use: No     Allergies   Cimetidine, Naproxen sodium, Fluzone [flu virus vaccine], Fosamax [alendronate sodium], Loratadine, and Iodinated diagnostic agents   Review of Systems Review of Systems  Constitutional: Negative for fever.  HENT: Negative for sore throat.   Eyes: Negative for visual disturbance.  Respiratory: Negative for cough and shortness of breath.   Cardiovascular: Positive for chest pain.  Gastrointestinal: Positive for abdominal pain, constipation and nausea. Negative for diarrhea.  Genitourinary: Negative for difficulty urinating and dysuria.  Musculoskeletal: Negative for back pain and neck pain.  Skin: Negative for rash.  Neurological: Negative for syncope and headaches.     Physical Exam Updated Vital Signs BP (!) 198/81 (BP Location: Right Arm)    Pulse (!) 101    Temp 98.1 F (36.7 C) (Oral)    Resp 20    SpO2 96%   Physical Exam Vitals signs and nursing note reviewed.  Constitutional:      General: She is not in acute distress.    Appearance: She is well-developed. She is not diaphoretic.  HENT:     Head: Normocephalic and atraumatic.  Eyes:     Conjunctiva/sclera: Conjunctivae  normal.  Neck:     Musculoskeletal: Normal range of motion.  Cardiovascular:     Rate and Rhythm: Normal rate and regular rhythm.     Heart sounds: Normal heart sounds. No murmur. No friction rub. No gallop.   Pulmonary:     Effort: Pulmonary effort is normal. No respiratory distress.     Breath sounds: Normal breath sounds. No wheezing or rales.  Abdominal:     General: There is distension.     Palpations: Abdomen is soft.     Tenderness: There is generalized abdominal tenderness (diffuse). There is no guarding. Positive signs include Murphy's sign.  Musculoskeletal:        General: No tenderness.  Skin:    General: Skin is warm and dry.     Findings: No erythema or rash.  Neurological:     Mental Status: She is alert and oriented to person, place, and time.  ED Treatments / Results  Labs (all labs ordered are listed, but only abnormal results are displayed) Labs Reviewed  CBC WITH DIFFERENTIAL/PLATELET - Abnormal; Notable for the following components:      Result Value   WBC 10.8 (*)    Hemoglobin 16.3 (*)    HCT 49.2 (*)    Neutro Abs 8.3 (*)    All other components within normal limits  COMPREHENSIVE METABOLIC PANEL - Abnormal; Notable for the following components:   Glucose, Bld 103 (*)    All other components within normal limits  URINALYSIS, ROUTINE W REFLEX MICROSCOPIC - Abnormal; Notable for the following components:   Color, Urine STRAW (*)    Hgb urine dipstick SMALL (*)    Leukocytes,Ua SMALL (*)    Bacteria, UA RARE (*)    All other components within normal limits  TROPONIN I - Abnormal; Notable for the following components:   Troponin I 0.08 (*)    All other components within normal limits  TROPONIN I - Abnormal; Notable for the following components:   Troponin I 0.10 (*)    All other components within normal limits  SARS CORONAVIRUS 2 (HOSPITAL ORDER, PERFORMED IN [facility])  URINE CULTURE  LIPASE, BLOOD  TROPONIN I  TROPONIN I    TROPONIN I  TSH  LIPID PANEL    EKG EKG Interpretation  Date/Time:  Friday May 30 2019 13:23:48 EDT Ventricular Rate:  92 PR Interval:    QRS Duration: 83 QT Interval:  368 QTC Calculation: 456 R Axis:   65 Text Interpretation:  Sinus rhythm Probable left atrial enlargement Low voltage, precordial leads Anteroseptal infarct, old Borderline repolarization abnormality No previous ECGs available Confirmed by Alvira MondaySchlossman, Travian Kerner ([id number]) on 05/30/2019 1:32:06 PM   Radiology Ct Abdomen Pelvis Wo Contrast  Result Date: 05/30/2019 CLINICAL DATA:  Upper abdominal pain, nausea and vomiting since yesterday. EXAM: CT ABDOMEN AND PELVIS WITHOUT CONTRAST TECHNIQUE: Multidetector CT imaging of the abdomen and pelvis was performed following the standard protocol without IV contrast. COMPARISON:  None. FINDINGS: Lower chest: Mild scarring/atelectasis at the lung bases. Hepatobiliary: Status post cholecystectomy. Associated pneumobilia. No focal liver abnormality is seen. Pancreas: Unremarkable. No pancreatic ductal dilatation or surrounding inflammatory changes. Spleen: Normal in size without focal abnormality. Adrenals/Urinary Tract: Adrenal glands appear normal. Kidneys are unremarkable without mass, stone or hydronephrosis. No ureteral or bladder calculi. Bladder appears normal, partially decompressed. Stomach/Bowel: Stool and oral contrast is present within the normal caliber colon. Mildly distended small bowel loops within the central abdomen to LEFT lower quadrant. No obstructing transition zone identified. No evidence of associated bowel wall inflammation. Stomach is unremarkable. Appendix is normal. Vascular/Lymphatic: Extensive aortic atherosclerosis. No enlarged lymph nodes seen. Reproductive: Uterus and bilateral adnexa are unremarkable. Other: No free fluid or abscess collection. No free intraperitoneal air. Musculoskeletal: No acute or suspicious osseous finding. Degenerative spondylosis of the  lumbar spine, mild to moderate in degree. Chronic appearing compression deformities of the L3 and L4 vertebral bodies. IMPRESSION: 1. Mildly distended small bowel loops within the central abdomen and left lower quadrant, compatible with partial obstruction versus ileus. No obstructing transition zone identified. No obstructing mass or bowel wall inflammation. Stool and oral contrast within the normal caliber colon. 2. Additional chronic/incidental findings detailed above. Aortic Atherosclerosis (ICD10-I70.0). Electronically Signed   By: Bary RichardStan  Maynard M.D.   On: 05/30/2019 16:33   Dg Abd Portable 1v-small Bowel Protocol-position Verification  Result Date: 05/30/2019 CLINICAL DATA:  Status post NG tube placement today.  EXAM: PORTABLE ABDOMEN - 1 VIEW COMPARISON:  None. FINDINGS: NG tube is in place with the tip in the stomach. Side port is near the gastroesophageal junction. The tube could be advanced 2 cm for better positioning. IMPRESSION: As above. Electronically Signed   By: Drusilla Kanner M.D.   On: 05/30/2019 19:47    Procedures .Critical Care Performed by: Alvira Monday, MD Authorized by: Alvira Monday, MD   Critical care provider statement:    Critical care time (minutes):  30   Critical care was time spent personally by me on the following activities:  Discussions with consultants, evaluation of patient's response to treatment, examination of patient, ordering and performing treatments and interventions, ordering and review of laboratory studies, ordering and review of radiographic studies, pulse oximetry, re-evaluation of patient's condition, obtaining history from patient or surrogate and review of old charts   (including critical care time)  Medications Ordered in ED Medications  lactated ringers bolus 1,000 mL (0 mLs Intravenous Stopped 05/30/19 2038)  lactated ringers bolus 1,000 mL (has no administration in time range)  chlorproMAZINE (THORAZINE) 25 mg in sodium chloride 0.9  % 25 mL IVPB (has no administration in time range)  lip balm (CARMEX) ointment 1 application (1 application Topical Given 05/30/19 2253)  magic mouthwash (has no administration in time range)  bisacodyl (DULCOLAX) suppository 10 mg (10 mg Rectal Not Given 05/30/19 2254)  guaiFENesin-dextromethorphan (ROBITUSSIN DM) 100-10 MG/5ML syrup 10 mL (has no administration in time range)  hydrocortisone (ANUSOL-HC) 2.5 % rectal cream 1 application (has no administration in time range)  alum & mag hydroxide-simeth (MAALOX/MYLANTA) 200-200-20 MG/5ML suspension 30 mL (has no administration in time range)  hydrocortisone cream 1 % 1 application (has no administration in time range)  menthol-cetylpyridinium (CEPACOL) lozenge 3 mg (has no administration in time range)  phenol (CHLORASEPTIC) mouth spray 1-2 spray (has no administration in time range)  lisinopril (ZESTRIL) tablet 5 mg (has no administration in time range)  atorvastatin (LIPITOR) tablet 40 mg (has no administration in time range)  levothyroxine (SYNTHROID) tablet 25 mcg (has no administration in time range)  pantoprazole (PROTONIX) EC tablet 40 mg (has no administration in time range)  lacosamide (VIMPAT) tablet 100 mg (100 mg Oral Given 05/30/19 2257)  methocarbamol (ROBAXIN) tablet 500 mg (has no administration in time range)  benzonatate (TESSALON) capsule 100 mg (100 mg Oral Given 05/30/19 2257)  montelukast (SINGULAIR) tablet 10 mg (10 mg Oral Given 05/30/19 2306)  latanoprost (XALATAN) 0.005 % ophthalmic solution 1 drop (1 drop Both Eyes Given 05/30/19 2307)  0.9 %  sodium chloride infusion ( Intravenous New Bag/Given 05/30/19 2254)  heparin injection 5,000 Units (5,000 Units Subcutaneous Given 05/30/19 2251)  morphine 2 MG/ML injection 2 mg (has no administration in time range)  acetaminophen (TYLENOL) tablet 650 mg (has no administration in time range)  polyvinyl alcohol (LIQUIFILM TEARS) 1.4 % ophthalmic solution 1 drop (has no administration  in time range)  prochlorperazine (COMPAZINE) injection 5-10 mg (has no administration in time range)  promethazine (PHENERGAN) suppository 12.5 mg (has no administration in time range)  ondansetron (ZOFRAN) injection 4 mg (4 mg Intravenous Given 05/30/19 2251)    Or  ondansetron (ZOFRAN) 8 mg in sodium chloride 0.9 % 50 mL IVPB ( Intravenous See Alternative 05/30/19 2251)  ondansetron (ZOFRAN) injection 4 mg (4 mg Intravenous Given 05/30/19 1337)  aspirin chewable tablet 324 mg (324 mg Oral Given 05/30/19 1429)  diatrizoate meglumine-sodium (GASTROGRAFIN) 66-10 % solution 90 mL (90 mLs Per NG  tube Given 05/30/19 2256)  lactated ringers bolus 1,000 mL (0 mLs Intravenous Duplicate 0/24/09 7353)     Initial Impression / Assessment and Plan / ED Course  I have reviewed the triage vital signs and the nursing notes.  Pertinent labs & imaging results that were available during my care of the patient were reviewed by me and considered in my medical decision making (see chart for details).        85 year old female with a history of CVA, thyroid disease, hypertension, chronic back pain, cholecystectomy, presents with concern for abdominal pain.  DDx includes SBO, viral etiology, UTI, pancreatitis, choledocolithiasis.  Did report a "twinge" of pain to chest, and troponin and ECG performed showing no acute ECG changes but troponin elevated to .08. No active chest pain. Given aspirin.  CT abdomen pelvis shows partial bowel small bowel obstruction versus ileus.  Clinically given nausea, distention, lack of flatus, and abdominal discomfort I suspect likely partial small bowel obstruction.  Since arriving to the emergency department, she has had a bowel movement, and suspect that we are beginning to see resolution of the symptoms, however do suspect this is the primary cause of her pain.  Rechecked a troponin which is 0.1.  I continue to suspect that this elevation is secondary to demand ischemia in a  85 year old with partial small bowel obstruction, however we will continue to monitor in the hospital.  For her small bowel obstruction, Dr. Johney Maine of general surgery was consulted, recommends NG tube placement and small bowel protocol. Cardiology, Dr. Domenic Polite was consulted and they will evaluate her in the hospital.  Recommend continuing to trend troponins.  Patient to be admitted by the hospital service for management of small bowel obstruction and troponin elevation.  Final Clinical Impressions(s) / ED Diagnoses   Final diagnoses:  Partial small bowel obstruction ([place])  Troponin level elevated    ED Discharge Orders    None         Gareth Morgan, MD 05/30/19 2347

## 2019-05-30 NOTE — ED Notes (Signed)
Bed: WA21 Expected date:  Expected time:  Means of arrival:  Comments: EMS-85 y/o abdominal pain

## 2019-05-30 NOTE — ED Notes (Signed)
Dr. Billy Fischer aware troponin 0.08

## 2019-05-30 NOTE — Progress Notes (Signed)
Clamping NGT for 1 hour  for gastrografin pushed via NGT

## 2019-05-30 NOTE — ED Notes (Addendum)
Date and time results received: 05/30/19 2:19 PM  (use smartphrase ".now" to insert current time)  Test: troponin  Critical Value: 0.08  Name of Provider Notified: Wells Guiles RN  Orders Received? Or Actions Taken?: Dr Billy Fischer

## 2019-05-30 NOTE — Consult Note (Signed)
Raven Peterson  1922/06/15 [id number]  CARE TEAM:  PCP: Patient, No Pcp Per  Outpatient Care Team: Patient Care Team: Patient, No Pcp Per as PCP - General ([place]) Hartsell, Marga Hoots, MD as Consulting Physician (Neurology) Albin Fischer, Darrick Grinder, MD as Consulting Physician (Cardiology)  Inpatient Treatment Team: Treatment Team: Attending Provider: Gareth Morgan, MD; Registered Nurse: Kathlen Mody, RN; Technician: Rickey Primus, NT; Rounding Team: Jackelyn Knife, MD; Consulting Physician: Nolon Nations, MD; Rounding Team: Lbcardiology, Rounding, MD   This patient is a 85 y.o.female who presents today for surgical evaluation at the request of Dr Billy Fischer.   Chief complaint / Reason for evaluation: SBO  85 year old female originally from [place] with a history of CVA, thyroid disease, hypertension, chronic back pain.  Patient began having upper abdominal pain yesterday evening after dinner.  Reports its a dull pain across her epigastrium.  Chest apin as well.  No flatus or BM in 2 days.  Worsening pain & nausea - came to [facility] ED.  No vomiting.  H/o Stroke 2015 presumed to be cardioembolic - on chronic anticoagulation (Eliquis).  H/o cholecystectomy.  Patient lived in [place] for quite some time but has been in assisted living in [place] closer to family for the past few years.  Gets most care through the [facility].  Walks with a walker  No personal nor family history of GI/colon cancer, inflammatory bowel disease, irritable bowel syndrome, allergy such as Celiac Sprue, dietary/dairy problems, colitis, ulcers nor gastritis.  No recent sick contacts/gastroenteritis.  No travel outside the country.  No changes in diet.  No dysphagia to solids or liquids.  No significant heartburn or reflux.  No hematochezia, hematemesis, coffee ground emesis.  No evidence of prior gastric/peptic ulceration.   Assessment  Raven Peterson  85 y.o. female       Problem  List:  Principal Problem:   SBO (small bowel obstruction) (HCC) Active Problems:   History of stroke   Chronic anticoagulation   Hypothyroidism   Hyperlipidemia   Atherosclerosis of abdominal aorta (HCC)   Ileus (HCC)   Elevated troponin level   Dependent on walker for ambulation   SBO vs ileus  Plan:  Small Bowel protocol  NGT.  Small bore placed in the ER.  High risk of clogging.  Needs to be flushed regularly.  NPO  IVFs  Hold anticoagulation  IV medications  PPI  R/o MI per primary medicine service  -VTE prophylaxis- SCDs, etc -mobilize as tolerated to help recovery  45 minutes spent in review, evaluation, examination, counseling, and coordination of care.  More than 50% of that time was spent in counseling.  Plan discussed with patient and her nurse in the room  Adin Hector, MD, FACS, MASCRS Gastrointestinal and Minimally Invasive Surgery    [address], [place], [place] [address] [phone] Main / Paging [phone] Fax   05/30/2019      Past Medical History:  Diagnosis Date   Acute ischemic stroke ([place]) 10/02/2014   CAP (community acquired pneumonia) 03/17/2015   Chronic back pain    "used to get shots in my back by a Pain Management doctor"   Hypertension    Seizure ([place]) 10/02/2014   Thyroid disease    Vertebral compression fracture [facility])     Past Surgical History:  Procedure Laterality Date   CHOLECYSTECTOMY     EYE SURGERY      Social History   Socioeconomic History  Marital status: Widowed    Spouse name: Not on file   Number of children: Not on file   Years of education: Not on file   Highest education level: Not on file  Occupational History   Not on file  Social Needs   Financial resource strain: Not on file   Food insecurity    Worry: Not on file    Inability: Not on file   Transportation needs    Medical: Not on file    Non-medical: Not on file  Tobacco Use    Smoking status: Never Smoker   Smokeless tobacco: Never Used  Substance and Sexual Activity   Alcohol use: No   Drug use: No   Sexual activity: Not on file  Lifestyle   Physical activity    Days per week: Not on file    Minutes per session: Not on file   Stress: Not on file  Relationships   Social connections    Talks on phone: Not on file    Gets together: Not on file    Attends religious service: Not on file    Active member of club or organization: Not on file    Attends meetings of clubs or organizations: Not on file    Relationship status: Not on file   Intimate partner violence    Fear of current or ex partner: Not on file    Emotionally abused: Not on file    Physically abused: Not on file    Forced sexual activity: Not on file  Other Topics Concern   Not on file  Social History Narrative   Not on file    Family History  Problem Relation Age of Onset   Healthy Mother    Asthma Father     Current Facility-Administered Medications  Medication Dose Route Frequency Provider Last Rate Last Dose   alum & mag hydroxide-simeth (MAALOX/MYLANTA) 200-200-20 MG/5ML suspension 30 mL  30 mL Oral Q6H PRN Karie SodaGross, Marisha Renier, MD       bisacodyl (DULCOLAX) suppository 10 mg  10 mg Rectal Daily Karie SodaGross, Madia Carvell, MD       chlorproMAZINE (THORAZINE) 25 mg in sodium chloride 0.9 % 25 mL IVPB  25 mg Intravenous Q6H PRN Karie SodaGross, Tameya Kuznia, MD       diatrizoate meglumine-sodium (GASTROGRAFIN) 66-10 % solution 90 mL  90 mL Per NG tube Once Karie SodaGross, Greely Atiyeh, MD       guaiFENesin-dextromethorphan (ROBITUSSIN DM) 100-10 MG/5ML syrup 10 mL  10 mL Oral Q4H PRN Karie SodaGross, Rudolph Daoust, MD       hydrocortisone (ANUSOL-HC) 2.5 % rectal cream 1 application  1 application Topical QID PRN Karie SodaGross, Meshilem Machuca, MD       hydrocortisone cream 1 % 1 application  1 application Topical TID PRN Karie SodaGross, Marciana Uplinger, MD       lactated ringers bolus 1,000 mL  1,000 mL Intravenous Once Karie SodaGross, Elienai Gailey, MD       lactated ringers  bolus 1,000 mL  1,000 mL Intravenous Q8H PRN Jaymere Alen, Viviann SpareSteven, MD       lip balm (CARMEX) ointment 1 application  1 application Topical BID Karie SodaGross, Garren Greenman, MD       magic mouthwash  15 mL Oral QID PRN Karie SodaGross, Rashaun Wichert, MD       menthol-cetylpyridinium (CEPACOL) lozenge 3 mg  1 lozenge Oral PRN Karie SodaGross, Cincere Zorn, MD       ondansetron (ZOFRAN) injection 4 mg  4 mg Intravenous Q6H PRN Karie SodaGross, Mosie Angus, MD       Or  ondansetron (ZOFRAN) 8 mg in sodium chloride 0.9 % 50 mL IVPB  8 mg Intravenous Q6H PRN Karie Soda, MD       phenol (CHLORASEPTIC) mouth spray 1-2 spray  1-2 spray Mouth/Throat PRN Karie Soda, MD       Current Outpatient Medications  Medication Sig Dispense Refill   acetaminophen (TYLENOL) 500 MG tablet Take 500 mg by mouth 2 (two) times daily as needed for mild pain.     apixaban (ELIQUIS) 2.5 MG TABS tablet Take 2.5 mg by mouth 2 (two) times daily. Reported on 04/03/2016     benzonatate (TESSALON) 100 MG capsule Take 1 capsule (100 mg total) by mouth every 8 (eight) hours. 21 capsule 0   carboxymethylcellulose (REFRESH PLUS) 0.5 % SOLN Place 1 drop into both eyes every morning.     HYDROcodone-acetaminophen (NORCO/VICODIN) 5-325 MG tablet Take 1 tablet by mouth 3 (three) times daily as needed for moderate pain.      Lacosamide (VIMPAT) 100 MG TABS Take 100 mg by mouth 2 (two) times daily.      latanoprost (XALATAN) 0.005 % ophthalmic solution Place 1 drop into both eyes at bedtime.      levothyroxine (SYNTHROID, LEVOTHROID) 25 MCG tablet Take 25 mcg by mouth daily before breakfast.      lisinopril (PRINIVIL,ZESTRIL) 5 MG tablet Take 5 mg by mouth daily.      methocarbamol (ROBAXIN) 500 MG tablet Take 500 mg by mouth 2 (two) times daily as needed for muscle spasms.     montelukast (SINGULAIR) 10 MG tablet Take by mouth at bedtime.      omeprazole (PRILOSEC) 20 MG capsule Take 20 mg by mouth daily.     atorvastatin (LIPITOR) 40 MG tablet Take 40 mg by mouth daily.        Allergies  Allergen Reactions   Cimetidine Other (See Comments)    Palpitations.    Naproxen Sodium Other (See Comments)    Affects glaucoma medication   Fluzone [Flu Virus Vaccine]     Unknown- listed on MAR   Fosamax [Alendronate Sodium]     Unknown- listed on MAR   Loratadine     Unknown- listed on MAR    Iodinated Diagnostic Agents Nausea Only    ROS:   All other systems reviewed & are negative except per HPI or as noted below: Constitutional:  No fevers, chills, sweats.  Weight stable Eyes:  No vision changes, No discharge HENT:  No sore throats, nasal drainage Lymph: No neck swelling, No bruising easily Pulmonary:  No cough, productive sputum CV: No orthopnea, PND   GI:  No personal nor family history of GI/colon cancer, inflammatory bowel disease, irritable bowel syndrome, allergy such as Celiac Sprue, dietary/dairy problems, colitis, ulcers nor gastritis.  No recent sick contacts/gastroenteritis.  No travel outside the country.  No changes in diet. Renal: No UTIs, No hematuria Genital:  No drainage, bleeding, masses Musculoskeletal: No severe joint pain.  Good ROM major joints Skin:  No sores or lesions.  No rashes Heme/Lymph:  No easy bleeding.  No swollen lymph nodes Neuro: No focal weakness/numbness.  No seizures Psych: No suicidal ideation.  No hallucinations.  Some mild memory loss  BP (!) 186/75    Pulse 96    Temp 97.9 F (36.6 C)    Resp 14    SpO2 93%   Physical Exam: General: Pt awake/alert/oriented x4 in mild acute distress Eyes: PERRL, normal EOM. Sclera nonicteric Neuro: CN II-XII intact w/o focal sensory/motor  deficits. Lymph: No head/neck/groin lymphadenopathy Psych:  No delerium/psychosis/paranoia HENT: Normocephalic, Mucus membranes moist.  No thrush.  Narrow NG tube in place.  I flushed and unclogged.  Moderately thick bilious fluid in canister  neck: Supple, No tracheal deviation Chest: No pain.  Good respiratory excursion. CV:  Pulses  intact.  Regular rhythm Abdomen: Obese but soft.  Mildly distended.  Nontender.  No incarcerated hernias. Gen:  No inguinal hernias.  No inguinal lymphadenopathy.   Ext:  SCDs BLE.  No significant edema.  No cyanosis Skin: No petechiae / purpurea.  No major sores Musculoskeletal: No severe joint pain.  Good ROM major joints   Results:   Labs: Results for orders placed or performed during the hospital encounter of 05/30/19 (from the past 48 hour(s))  CBC with Differential     Status: Abnormal   Collection Time: 05/30/19  1:25 PM  Result Value Ref Range   WBC 10.8 (H) 4.0 - 10.5 K/uL   RBC 5.10 3.87 - 5.11 MIL/uL   Hemoglobin 16.3 (H) 12.0 - 15.0 g/dL   HCT 16.149.2 (H) 09.636.0 - 04.546.0 %   MCV 96.5 80.0 - 100.0 fL   MCH 32.0 26.0 - 34.0 pg   MCHC 33.1 30.0 - 36.0 g/dL   RDW 40.912.6 81.111.5 - 91.415.5 %   Platelets 255 150 - 400 K/uL   nRBC 0.0 0.0 - 0.2 %   Neutrophils Relative % 76 %   Neutro Abs 8.3 (H) 1.7 - 7.7 K/uL   Lymphocytes Relative 15 %   Lymphs Abs 1.6 0.7 - 4.0 K/uL   Monocytes Relative 7 %   Monocytes Absolute 0.8 0.1 - 1.0 K/uL   Eosinophils Relative 1 %   Eosinophils Absolute 0.1 0.0 - 0.5 K/uL   Basophils Relative 1 %   Basophils Absolute 0.1 0.0 - 0.1 K/uL   Immature Granulocytes 0 %   Abs Immature Granulocytes 0.02 0.00 - 0.07 K/uL    Comment: Performed at [facility], [address] [place], [place] [address]  Comprehensive metabolic panel     Status: Abnormal   Collection Time: 05/30/19  1:25 PM  Result Value Ref Range   Sodium 138 135 - 145 mmol/L   Potassium 3.8 3.5 - 5.1 mmol/L   Chloride 104 98 - 111 mmol/L   CO2 24 22 - 32 mmol/L   Glucose, Bld 103 (H) 70 - 99 mg/dL   BUN 16 8 - 23 mg/dL   Creatinine, Ser 6.210.81 0.44 - 1.00 mg/dL   Calcium 9.2 8.9 - 30.810.3 mg/dL   Total Protein 7.8 6.5 - 8.1 g/dL   Albumin 4.3 3.5 - 5.0 g/dL   AST 18 15 - 41 U/L   ALT 15 0 - 44 U/L   Alkaline Phosphatase 91 38 - 126 U/L   Total Bilirubin 0.9 0.3 - 1.2 mg/dL    GFR calc non Af Amer >60 >60 mL/min   GFR calc Af Amer >60 >60 mL/min   Anion gap 10 5 - 15    Comment: Performed at [facility], [address] [place], [place] [address]  Lipase, blood     Status: None   Collection Time: 05/30/19  1:25 PM  Result Value Ref Range   Lipase 40 11 - 51 U/L    Comment: Performed at [facility], [address] [place], [place] [address]  Troponin I - ONCE - STAT     Status: Abnormal   Collection Time: 05/30/19  1:28 PM  Result Value Ref Range   Troponin I 0.08 (HH) <0.03 ng/mL    Comment: CRITICAL RESULT CALLED TO, READ BACK BY AND VERIFIED WITH: HALL,C. RN @1719  ON 06.19.2020 BY COHEN,K Performed at [facility], [address] [place], [place] [address]   Urinalysis, Routine w reflex microscopic     Status: Abnormal   Collection Time: 05/30/19  1:38 PM  Result Value Ref Range   Color, Urine STRAW (A) YELLOW   APPearance CLEAR CLEAR   Specific Gravity, Urine 1.008 1.005 - 1.030   pH 5.0 5.0 - 8.0   Glucose, UA NEGATIVE NEGATIVE mg/dL   Hgb urine dipstick SMALL (A) NEGATIVE   Bilirubin Urine NEGATIVE NEGATIVE   Ketones, ur NEGATIVE NEGATIVE mg/dL   Protein, ur NEGATIVE NEGATIVE mg/dL   Nitrite NEGATIVE NEGATIVE   Leukocytes,Ua SMALL (A) NEGATIVE   RBC / HPF 0-5 0 - 5 RBC/hpf   WBC, UA 6-10 0 - 5 WBC/hpf   Bacteria, UA RARE (A) NONE SEEN   Squamous Epithelial / LPF 0-5 0 - 5    Comment: Performed at [facility], [address] [place], [place] [address]  SARS Coronavirus 2 (CEPHEID - Performed in [facility] hospital lab), Hosp Order     Status: None   Collection Time: 05/30/19  4:36 PM   Specimen: Nasopharyngeal Swab  Result Value Ref Range   SARS Coronavirus 2 NEGATIVE NEGATIVE    Comment: (NOTE) If result is NEGATIVE SARS-CoV-2 target nucleic acids are NOT DETECTED. The SARS-CoV-2 RNA is generally detectable in upper and lower  respiratory specimens during  the acute phase of infection. The lowest  concentration of SARS-CoV-2 viral copies this assay can detect is 250  copies / mL. A negative result does not preclude SARS-CoV-2 infection  and should not be used as the sole basis for treatment or other  patient management decisions.  A negative result may occur with  improper specimen collection / handling, submission of specimen other  than nasopharyngeal swab, presence of viral mutation(s) within the  areas targeted by this assay, and inadequate number of viral copies  (<250 copies / mL). A negative result must be combined with clinical  observations, patient history, and epidemiological information. If result is POSITIVE SARS-CoV-2 target nucleic acids are DETECTED. The SARS-CoV-2 RNA is generally detectable in upper and lower  respiratory specimens dur ing the acute phase of infection.  Positive  results are indicative of active infection with SARS-CoV-2.  Clinical  correlation with patient history and other diagnostic information is  necessary to determine patient infection status.  Positive results do  not rule out bacterial infection or co-infection with other viruses. If result is PRESUMPTIVE POSTIVE SARS-CoV-2 nucleic acids MAY BE PRESENT.   A presumptive positive result was obtained on the submitted specimen  and confirmed on repeat testing.  While 2019 novel coronavirus  (SARS-CoV-2) nucleic acids may be present in the submitted sample  additional confirmatory testing may be necessary for epidemiological  and / or clinical management purposes  to differentiate between  SARS-CoV-2 and other Sarbecovirus currently known to infect humans.  If clinically indicated additional testing with an alternate test  methodology [phone] is advised. The SARS-CoV-2 RNA is generally  detectable in upper and lower respiratory sp ecimens during the acute  phase of infection. The expected result is Negative. Fact Sheet for Patients:   BoilerBrush.com.cyhttps://www.fda.gov/media/136312/download Fact Sheet for Healthcare Providers: https://pope.com/https://www.fda.gov/media/136313/download This test is not yet approved or cleared by  the [employer] and has been authorized for detection and/or diagnosis of SARS-CoV-2 by FDA under an Emergency Use Authorization (EUA).  This EUA will remain in effect (meaning this test can be used) for the duration of the COVID-19 declaration under Section 564(b)(1) of the Act, 21 U.S.C. section 360bbb-3(b)(1), unless the authorization is terminated or revoked sooner. Performed at [facility], [address], [place], [place] [address]   Troponin I - ONCE - STAT     Status: Abnormal   Collection Time: 05/30/19  5:30 PM  Result Value Ref Range   Troponin I 0.10 (HH) <0.03 ng/mL    Comment: CRITICAL VALUE NOTED.  VALUE IS CONSISTENT WITH PREVIOUSLY REPORTED AND CALLED VALUE. Performed at [facility], [address], [place], [place] [address]     Imaging / Studies: Ct Abdomen Pelvis Wo Contrast  Result Date: 05/30/2019 CLINICAL DATA:  Upper abdominal pain, nausea and vomiting since yesterday. EXAM: CT ABDOMEN AND PELVIS WITHOUT CONTRAST TECHNIQUE: Multidetector CT imaging of the abdomen and pelvis was performed following the standard protocol without IV contrast. COMPARISON:  None. FINDINGS: Lower chest: Mild scarring/atelectasis at the lung bases. Hepatobiliary: Status post cholecystectomy. Associated pneumobilia. No focal liver abnormality is seen. Pancreas: Unremarkable. No pancreatic ductal dilatation or surrounding inflammatory changes. Spleen: Normal in size without focal abnormality. Adrenals/Urinary Tract: Adrenal glands appear normal. Kidneys are unremarkable without mass, stone or hydronephrosis. No ureteral or bladder calculi. Bladder appears normal, partially decompressed. Stomach/Bowel: Stool and oral contrast is present within the normal caliber colon. Mildly distended  small bowel loops within the central abdomen to LEFT lower quadrant. No obstructing transition zone identified. No evidence of associated bowel wall inflammation. Stomach is unremarkable. Appendix is normal. Vascular/Lymphatic: Extensive aortic atherosclerosis. No enlarged lymph nodes seen. Reproductive: Uterus and bilateral adnexa are unremarkable. Other: No free fluid or abscess collection. No free intraperitoneal air. Musculoskeletal: No acute or suspicious osseous finding. Degenerative spondylosis of the lumbar spine, mild to moderate in degree. Chronic appearing compression deformities of the L3 and L4 vertebral bodies. IMPRESSION: 1. Mildly distended small bowel loops within the central abdomen and left lower quadrant, compatible with partial obstruction versus ileus. No obstructing transition zone identified. No obstructing mass or bowel wall inflammation. Stool and oral contrast within the normal caliber colon. 2. Additional chronic/incidental findings detailed above. Aortic Atherosclerosis (ICD10-I70.0). Electronically Signed   By: Bary Richard M.D.   On: 05/30/2019 16:33    Medications / Allergies: per chart  Antibiotics: Anti-infectives (From admission, onward)   None        Note: Portions of this report may have been transcribed using voice recognition software. Every effort was made to ensure accuracy; however, inadvertent computerized transcription errors may be present.   Any transcriptional errors that result from this process are unintentional.    Ardeth Sportsman, MD, FACS, MASCRS Gastrointestinal and Minimally Invasive Surgery    [address] [place], [place] [address] [phone] Main / Paging [phone] Fax   05/30/2019

## 2019-05-30 NOTE — ED Notes (Signed)
ED TO INPATIENT HANDOFF REPORT  ED Nurse Name and Phone #:   S Name/Age/Gender Raven Peterson 85 y.o. female Room/Bed: WA21/WA21  Code Status   Code Status: Not on file  Home/SNF/Other Nursing Home Patient oriented to: self and time Is this baseline? Yes   Triage Complete: Triage complete  Chief Complaint abdominal pain; vomiting  Triage Note Pt BIB EMS from [facility]. Pt c/o vomiting and abdominal pain since last night. Pt alert and oriented with EMS. Pt says pain is in upper abdomen.   162/106 BP   Allergies Allergies  Allergen Reactions  . Cimetidine Other (See Comments)    Palpitations.   . Naproxen Sodium Other (See Comments)    Affects glaucoma medication  . Fluzone [Flu Virus Vaccine]     Unknown- listed on MAR  . Fosamax [Alendronate Sodium]     Unknown- listed on MAR  . Loratadine     Unknown- listed on MAR   . Iodinated Diagnostic Agents Nausea Only    Level of Care/Admitting Diagnosis ED Disposition    ED Disposition Condition Comment   Admit  [facility] [[id number]]  Level of Care: Telemetry [5]  Admit to tele based on following criteria: Monitor for Ischemic changes  Covid Evaluation: Confirmed COVID Negative  Diagnosis: [facility](HCC) [[id number]]) [[id number]]  Admitting Physician: Tonye RoyaltyHUGELMEYER, ALEXIS [[id number]]  Attending Physician: Janann ColonelHUGELMEYER, ALEXIS [[id number]]  Estimated length of stay: past midnight tomorrow  Certification:: I certify this patient will need inpatient services for at least 2 midnights  PT Class (Do Not Modify): Inpatient [101]  PT Acc Code (Do Not Modify): Private [1]       B Medical/Surgery History Past Medical History:  Diagnosis Date  . Acute ischemic stroke (HCC) 10/02/2014  . CAP (community acquired pneumonia) 03/17/2015  . Chronic back pain    "used to get shots in my back by a Pain Management doctor"  . Hypertension   . Seizure (HCC) 10/02/2014  . Thyroid disease   . Vertebral compression fracture  [facility](HCC)    Past Surgical History:  Procedure Laterality Date  . CHOLECYSTECTOMY    . EYE SURGERY       A IV Location/Drains/Wounds Patient Lines/Drains/Airways Status   Active Line/Drains/Airways    None          Intake/Output Last 24 hours No intake or output data in the 24 hours ending 05/30/19 1922  Labs/Imaging Results for orders placed or performed during the hospital encounter of 05/30/19 (from the past 48 hour(s))  CBC with Differential     Status: Abnormal   Collection Time: 05/30/19  1:25 PM  Result Value Ref Range   WBC 10.8 (H) 4.0 - 10.5 K/uL   RBC 5.10 3.87 - 5.11 MIL/uL   Hemoglobin 16.3 (H) 12.0 - 15.0 g/dL   HCT 41.349.2 (H) 24.436.0 - 01.046.0 %   MCV 96.5 80.0 - 100.0 fL   MCH 32.0 26.0 - 34.0 pg   MCHC 33.1 30.0 - 36.0 g/dL   RDW 27.212.6 53.611.5 - 64.415.5 %   Platelets 255 150 - 400 K/uL   nRBC 0.0 0.0 - 0.2 %   Neutrophils Relative % 76 %   Neutro Abs 8.3 (H) 1.7 - 7.7 K/uL   Lymphocytes Relative 15 %   Lymphs Abs 1.6 0.7 - 4.0 K/uL   Monocytes Relative 7 %   Monocytes Absolute 0.8 0.1 - 1.0 K/uL   Eosinophils Relative 1 %   Eosinophils Absolute 0.1 0.0 - 0.5 K/uL  Basophils Relative 1 %   Basophils Absolute 0.1 0.0 - 0.1 K/uL   Immature Granulocytes 0 %   Abs Immature Granulocytes 0.02 0.00 - 0.07 K/uL    Comment: Performed at [facility], [address] [place], [place] [address]  Comprehensive metabolic panel     Status: Abnormal   Collection Time: 05/30/19  1:25 PM  Result Value Ref Range   Sodium 138 135 - 145 mmol/L   Potassium 3.8 3.5 - 5.1 mmol/L   Chloride 104 98 - 111 mmol/L   CO2 24 22 - 32 mmol/L   Glucose, Bld 103 (H) 70 - 99 mg/dL   BUN 16 8 - 23 mg/dL   Creatinine, Ser 6.040.81 0.44 - 1.00 mg/dL   Calcium 9.2 8.9 - 54.010.3 mg/dL   Total Protein 7.8 6.5 - 8.1 g/dL   Albumin 4.3 3.5 - 5.0 g/dL   AST 18 15 - 41 U/L   ALT 15 0 - 44 U/L   Alkaline Phosphatase 91 38 - 126 U/L   Total Bilirubin 0.9 0.3 - 1.2 mg/dL   GFR calc non Af  Amer >60 >60 mL/min   GFR calc Af Amer >60 >60 mL/min   Anion gap 10 5 - 15    Comment: Performed at [facility], [address] [place], [place] [address]  Lipase, blood     Status: None   Collection Time: 05/30/19  1:25 PM  Result Value Ref Range   Lipase 40 11 - 51 U/L    Comment: Performed at [facility], [address] [place], [place] [address]  Troponin I - ONCE - STAT     Status: Abnormal   Collection Time: 05/30/19  1:28 PM  Result Value Ref Range   Troponin I 0.08 (HH) <0.03 ng/mL    Comment: CRITICAL RESULT CALLED TO, READ BACK BY AND VERIFIED WITH: HALL,C. RN @1719  ON 06.19.2020 BY COHEN,K Performed at [facility], [address] [place], [place] [address]   Urinalysis, Routine w reflex microscopic     Status: Abnormal   Collection Time: 05/30/19  1:38 PM  Result Value Ref Range   Color, Urine STRAW (A) YELLOW   APPearance CLEAR CLEAR   Specific Gravity, Urine 1.008 1.005 - 1.030   pH 5.0 5.0 - 8.0   Glucose, UA NEGATIVE NEGATIVE mg/dL   Hgb urine dipstick SMALL (A) NEGATIVE   Bilirubin Urine NEGATIVE NEGATIVE   Ketones, ur NEGATIVE NEGATIVE mg/dL   Protein, ur NEGATIVE NEGATIVE mg/dL   Nitrite NEGATIVE NEGATIVE   Leukocytes,Ua SMALL (A) NEGATIVE   RBC / HPF 0-5 0 - 5 RBC/hpf   WBC, UA 6-10 0 - 5 WBC/hpf   Bacteria, UA RARE (A) NONE SEEN   Squamous Epithelial / LPF 0-5 0 - 5    Comment: Performed at [facility], [address] [place], [place] [address]  SARS Coronavirus 2 (CEPHEID - Performed in [facility] hospital lab), Hosp Order     Status: None   Collection Time: 05/30/19  4:36 PM   Specimen: Nasopharyngeal Swab  Result Value Ref Range   SARS Coronavirus 2 NEGATIVE NEGATIVE    Comment: (NOTE) If result is NEGATIVE SARS-CoV-2 target nucleic acids are NOT DETECTED. The SARS-CoV-2 RNA is generally detectable in upper and lower  respiratory specimens during the acute phase of  infection. The lowest  concentration of SARS-CoV-2 viral copies this assay can detect is 250  copies / mL.  A negative result does not preclude SARS-CoV-2 infection  and should not be used as the sole basis for treatment or other  patient management decisions.  A negative result may occur with  improper specimen collection / handling, submission of specimen other  than nasopharyngeal swab, presence of viral mutation(s) within the  areas targeted by this assay, and inadequate number of viral copies  (<250 copies / mL). A negative result must be combined with clinical  observations, patient history, and epidemiological information. If result is POSITIVE SARS-CoV-2 target nucleic acids are DETECTED. The SARS-CoV-2 RNA is generally detectable in upper and lower  respiratory specimens dur ing the acute phase of infection.  Positive  results are indicative of active infection with SARS-CoV-2.  Clinical  correlation with patient history and other diagnostic information is  necessary to determine patient infection status.  Positive results do  not rule out bacterial infection or co-infection with other viruses. If result is PRESUMPTIVE POSTIVE SARS-CoV-2 nucleic acids MAY BE PRESENT.   A presumptive positive result was obtained on the submitted specimen  and confirmed on repeat testing.  While 2019 novel coronavirus  (SARS-CoV-2) nucleic acids may be present in the submitted sample  additional confirmatory testing may be necessary for epidemiological  and / or clinical management purposes  to differentiate between  SARS-CoV-2 and other Sarbecovirus currently known to infect humans.  If clinically indicated additional testing with an alternate test  methodology [id number]) is advised. The SARS-CoV-2 RNA is generally  detectable in upper and lower respiratory sp ecimens during the acute  phase of infection. The expected result is Negative. Fact Sheet for Patients:   StrictlyIdeas.no Fact Sheet for Healthcare Providers: BankingDealers.co.za This test is not yet approved or cleared by the [place] FDA and has been authorized for detection and/or diagnosis of SARS-CoV-2 by FDA under an Emergency Use Authorization (EUA).  This EUA will remain in effect (meaning this test can be used) for the duration of the COVID-19 declaration under Section 564(b)(1) of the Act, 21 U.S.C. section 360bbb-3(b)(1), unless the authorization is terminated or revoked sooner. Performed at [facility], [place] [address] [place], [place] [address]   Troponin I - ONCE - STAT     Status: Abnormal   Collection Time: 05/30/19  5:30 PM  Result Value Ref Range   Troponin I 0.10 (HH) <0.03 ng/mL    Comment: CRITICAL VALUE NOTED.  VALUE IS CONSISTENT WITH PREVIOUSLY REPORTED AND CALLED VALUE. Performed at [facility], [place] [address], [place], [place] [address]    Ct Abdomen Pelvis Wo Contrast  Result Date: 05/30/2019 CLINICAL DATA:  Upper abdominal pain, nausea and vomiting since yesterday. EXAM: CT ABDOMEN AND PELVIS WITHOUT CONTRAST TECHNIQUE: Multidetector CT imaging of the abdomen and pelvis was performed following the standard protocol without IV contrast. COMPARISON:  None. FINDINGS: Lower chest: Mild scarring/atelectasis at the lung bases. Hepatobiliary: Status post cholecystectomy. Associated pneumobilia. No focal liver abnormality is seen. Pancreas: Unremarkable. No pancreatic ductal dilatation or surrounding inflammatory changes. Spleen: Normal in size without focal abnormality. Adrenals/Urinary Tract: Adrenal glands appear normal. Kidneys are unremarkable without mass, stone or hydronephrosis. No ureteral or bladder calculi. Bladder appears normal, partially decompressed. Stomach/Bowel: Stool and oral contrast is present within the normal caliber colon. Mildly distended small bowel loops  within the central abdomen to LEFT lower quadrant. No obstructing transition zone identified. No evidence of associated bowel wall inflammation. Stomach is unremarkable. Appendix is normal. Vascular/Lymphatic: Extensive aortic atherosclerosis. No enlarged lymph  nodes seen. Reproductive: Uterus and bilateral adnexa are unremarkable. Other: No free fluid or abscess collection. No free intraperitoneal air. Musculoskeletal: No acute or suspicious osseous finding. Degenerative spondylosis of the lumbar spine, mild to moderate in degree. Chronic appearing compression deformities of the L3 and L4 vertebral bodies. IMPRESSION: 1. Mildly distended small bowel loops within the central abdomen and left lower quadrant, compatible with partial obstruction versus ileus. No obstructing transition zone identified. No obstructing mass or bowel wall inflammation. Stool and oral contrast within the normal caliber colon. 2. Additional chronic/incidental findings detailed above. Aortic Atherosclerosis (ICD10-I70.0). Electronically Signed   By: Bary Richard M.D.   On: 05/30/2019 16:33    Pending Labs Unresulted Labs (From admission, onward)    Start     Ordered   05/30/19 1316  Urine culture  ONCE - STAT,   STAT     05/30/19 1315   Signed and Held  Troponin I - Now Then Q6H  Now then every 6 hours,   TIMED     Signed and Held   Signed and Held  CBC  (heparin)  Once,   R    Comments: Baseline for heparin therapy IF NOT ALREADY DRAWN.  Notify MD if PLT < 100 K.    Signed and Held   Signed and Held  Creatinine, serum  (heparin)  Once,   R    Comments: Baseline for heparin therapy IF NOT ALREADY DRAWN.    Signed and Held   Signed and Held  TSH  Once,   R     Signed and Held   Signed and Held  Lipid panel  Once,   R     Signed and Held          Vitals/Pain Today's Vitals   05/30/19 1630 05/30/19 1700 05/30/19 1730 05/30/19 1830  BP: (!) 181/95 (!) 176/76 (!) 166/76 (!) 186/75  Pulse: 100 92 90 96  Resp: Temp:      SpO2: 94% 92% 95% 93%    Isolation Precautions No active isolations  Medications Medications  diatrizoate meglumine-sodium (GASTROGRAFIN) 66-10 % solution 90 mL (has no administration in time range)  lactated ringers bolus 1,000 mL (has no administration in time range)  lactated ringers bolus 1,000 mL (has no administration in time range)  ondansetron (ZOFRAN) injection 4 mg (has no administration in time range)    Or  ondansetron (ZOFRAN) 8 mg in sodium chloride 0.9 % 50 mL IVPB (has no administration in time range)  chlorproMAZINE (THORAZINE) 25 mg in sodium chloride 0.9 % 25 mL IVPB (has no administration in time range)  lip balm (CARMEX) ointment 1 application (has no administration in time range)  magic mouthwash (has no administration in time range)  bisacodyl (DULCOLAX) suppository 10 mg (has no administration in time range)  guaiFENesin-dextromethorphan (ROBITUSSIN DM) 100-10 MG/5ML syrup 10 mL (has no administration in time range)  hydrocortisone (ANUSOL-HC) 2.5 % rectal cream 1 application (has no administration in time range)  alum & mag hydroxide-simeth (MAALOX/MYLANTA) 200-200-20 MG/5ML suspension 30 mL (has no administration in time range)  hydrocortisone cream 1 % 1 application (has no administration in time range)  menthol-cetylpyridinium (CEPACOL) lozenge 3 mg (has no administration in time range)  phenol (CHLORASEPTIC) mouth spray 1-2 spray (has no administration in time range)  ondansetron (ZOFRAN) injection 4 mg (4 mg Intravenous Given 05/30/19 1337)  aspirin chewable tablet 324 mg (324 mg Oral Given 05/30/19 1429)  Mobility walks with person assist Low fall risk   Focused Assessments Cardiac Assessment Handoff:    Lab Results  Component Value Date   TROPONINI 0.10 (HH) 05/30/2019   No results found for: DDIMER Does the Patient currently have chest pain? No     R Recommendations: See Admitting Provider Note  Report given to:    Additional Notes:

## 2019-05-30 NOTE — H&P (Signed)
History and Physical   [facility] [place] [facility] Admission History and Physical [employer] Bruk [place], D.O.    Patient Name: Raven Peterson MR#: [id number] Date of Birth: 10/30/1922 Date of Admission: 05/30/2019  Referring MD/NP/PA: Dr. Pollie FriarSchlossberg Primary Care Physician: Patient, No Pcp Per  Chief Complaint:  Chief Complaint  Patient presents with  . Abdominal Pain  . Emesis    HPI: Raven Danielslse Gedney is a [age] y.o. female with a known history of CVA, seizure, hypothyroidism, HTN, presents to the emergency department for evaluation of abdominal pain.  Patient was in a usual state of health until abut 20 hours prior to arrival in ER when she developed epigastric abdominal pain associated with nausea but no vomiting, as well as constipation and chest pain.  She says she has not had a bowel movement in 2 days and is not passing gas.  She also reports episodic chestpain.   Patient denies fevers/chills, weakness, dizziness, shortness of breath, N/V/C/D, abdominal pain, dysuria/frequency, changes in mental status.      EMS/ED Course: Patient received LR, Zofran. Medical admission has been requested for further management of SBO vs. Ileus, elevated troponin likely demand ischemia, rule out ACS / NSTEMI.  Review of Systems:  CONSTITUTIONAL: No fever/chills, fatigue, weakness, weight gain/loss, headache. EYES: No blurry or double vision. ENT: No tinnitus, postnasal drip, redness or soreness of the oropharynx. RESPIRATORY: No cough, dyspnea, wheeze.  No hemoptysis.  CARDIOVASCULAR: Positive chest pain, negative palpitations, syncope, orthopnea. No lower extremity edema.  GASTROINTESTINAL: Positive nausea, abdominal pain, constipation. No vomiting No hematemesis, melena or hematochezia. GENITOURINARY: No dysuria, frequency, hematuria. ENDOCRINE: No polyuria or nocturia. No heat or cold intolerance. HEMATOLOGY: No anemia, bruising, bleeding. INTEGUMENTARY: No rashes, ulcers,  lesions. MUSCULOSKELETAL: No arthritis, gout. NEUROLOGIC: No numbness, tingling, ataxia, seizure-type activity, weakness. PSYCHIATRIC: No anxiety, depression, insomnia.   Past Medical History:  Diagnosis Date  . Acute ischemic stroke (HCC) 10/02/2014  . CAP (community acquired pneumonia) 03/17/2015  . Chronic back pain    "used to get shots in my back by a Pain Management doctor"  . Hypertension   . Seizure (HCC) 10/02/2014  . Thyroid disease   . Vertebral compression fracture [facility](HCC)     Past Surgical History:  Procedure Laterality Date  . EYE SURGERY       reports that she has never smoked. She has never used smokeless tobacco. She reports that she does not drink alcohol or use drugs.  Allergies  Allergen Reactions  . Cimetidine Other (See Comments)    Palpitations.   . Naproxen Sodium Other (See Comments)    Affects glaucoma medication  . Fluzone [Flu Virus Vaccine]     Unknown- listed on MAR  . Fosamax [Alendronate Sodium]     Unknown- listed on MAR  . Loratadine     Unknown- listed on MAR   . Iodinated Diagnostic Agents Nausea Only    Family History  Problem Relation Age of Onset  . Healthy Mother   . Asthma Father     Prior to Admission medications   Medication Sig Start Date End Date Taking? Authorizing Provider  acetaminophen (TYLENOL) 500 MG tablet Take 500 mg by mouth 2 (two) times daily as needed for mild pain.   Yes [provider]  apixaban (ELIQUIS) 2.5 MG TABS tablet Take 2.5 mg by mouth 2 (two) times daily. Reported on 04/03/2016 06/16/15 05/30/19 Yes [provider]  benzonatate (TESSALON) 100 MG capsule Take 1 capsule (100 mg total) by  mouth every 8 (eight) hours. 07/07/18  Yes Burky, Dorene GrebeNatalie B, NP  carboxymethylcellulose (REFRESH PLUS) 0.5 % SOLN Place 1 drop into both eyes every morning.   Yes [provider]  HYDROcodone-acetaminophen (NORCO/VICODIN) 5-325 MG tablet Take 1 tablet by mouth 3 (three) times daily as needed for  moderate pain.  04/02/15  Yes [provider]  Lacosamide (VIMPAT) 100 MG TABS Take 100 mg by mouth 2 (two) times daily.  07/13/15 05/30/19 Yes [provider]  latanoprost (XALATAN) 0.005 % ophthalmic solution Place 1 drop into both eyes at bedtime.    Yes [provider]  levothyroxine (SYNTHROID, LEVOTHROID) 25 MCG tablet Take 25 mcg by mouth daily before breakfast.  07/13/15  Yes [provider]  lisinopril (PRINIVIL,ZESTRIL) 5 MG tablet Take 5 mg by mouth daily.    Yes [provider]  methocarbamol (ROBAXIN) 500 MG tablet Take 500 mg by mouth 2 (two) times daily as needed for muscle spasms.   Yes [provider]  montelukast (SINGULAIR) 10 MG tablet Take by mouth at bedtime.    Yes [provider]  omeprazole (PRILOSEC) 20 MG capsule Take 20 mg by mouth daily.   Yes [provider]  atorvastatin (LIPITOR) 40 MG tablet Take 40 mg by mouth daily.    [provider]    Physical Exam: Vitals:   05/30/19 1630 05/30/19 1700 05/30/19 1730 05/30/19 1830  BP: (!) 181/95 (!) 176/76 (!) 166/76 (!) 186/75  Pulse: 100 92 90 96  Resp: 20 17 17 14   Temp:      SpO2: 94% 92% 95% 93%    GENERAL: 85 y.o.-year-old female patient, well-developed, well-nourished lying in the bed in no acute distress.  Pleasant and cooperative.   HEENT: Head atraumatic, normocephalic. Pupils equal. Mucus membranes moist.  NGT in place and draining green-brown fluid. Hard of hearing. NECK: Supple. No JVD. CHEST: Normal breath sounds bilaterally. No wheezing, rales, rhonchi or crackles. No use of accessory muscles of respiration. CARDIOVASCULAR: S1, S2 normal. No murmurs, rubs, or gallops. Cap refill <2 seconds. Pulses intact distally.  ABDOMEN: Soft, mildly distended with mild diffuse tenderness to palpation. No rebound, guarding, rigidity. Quiet bowel sounds present in all four quadrants.  EXTREMITIES: No pedal edema, cyanosis, or clubbing. No calf  tenderness or Homan's sign.  NEUROLOGIC: The patient is alert and oriented x 3.     Labs on Admission:  CBC: Recent Labs  Lab 05/30/19 1325  WBC 10.8*  NEUTROABS 8.3*  HGB 16.3*  HCT 49.2*  MCV 96.5  PLT 255   Basic Metabolic Panel: Recent Labs  Lab 05/30/19 1325  NA 138  K 3.8  CL 104  CO2 24  GLUCOSE 103*  BUN 16  CREATININE 0.81  CALCIUM 9.2   GFR: CrCl cannot be calculated (Unknown ideal weight.). Liver Function Tests: Recent Labs  Lab 05/30/19 1325  AST 18  ALT 15  ALKPHOS 91  BILITOT 0.9  PROT 7.8  ALBUMIN 4.3   Recent Labs  Lab 05/30/19 1325  LIPASE 40   No results for input(s): AMMONIA in the last 168 hours. Coagulation Profile: No results for input(s): INR, PROTIME in the last 168 hours. Cardiac Enzymes: Recent Labs  Lab 05/30/19 1328 05/30/19 1730  TROPONINI 0.08* 0.10*   BNP (last 3 results) No results for input(s): PROBNP in the last 8760 hours. HbA1C: No results for input(s): HGBA1C in the last 72 hours. CBG: No results for input(s): GLUCAP in the last 168 hours. Lipid  Profile: No results for input(s): CHOL, HDL, LDLCALC, TRIG, CHOLHDL, LDLDIRECT in the last 72 hours. Thyroid Function Tests: No results for input(s): TSH, T4TOTAL, FREET4, T3FREE, THYROIDAB in the last 72 hours. Anemia Panel: No results for input(s): VITAMINB12, FOLATE, FERRITIN, TIBC, IRON, RETICCTPCT in the last 72 hours. Urine analysis:    Component Value Date/Time   COLORURINE STRAW (A) 05/30/2019 1338   APPEARANCEUR CLEAR 05/30/2019 1338   LABSPEC 1.008 05/30/2019 1338   PHURINE 5.0 05/30/2019 1338   GLUCOSEU NEGATIVE 05/30/2019 1338   HGBUR SMALL (A) 05/30/2019 1338   BILIRUBINUR NEGATIVE 05/30/2019 1338   KETONESUR NEGATIVE 05/30/2019 1338   PROTEINUR NEGATIVE 05/30/2019 1338   NITRITE NEGATIVE 05/30/2019 1338   LEUKOCYTESUR SMALL (A) 05/30/2019 1338   Sepsis Labs: @LABRCNTIP (procalcitonin:4,lacticidven:4) ) Recent Results (from the past 240  hour(s))  SARS Coronavirus 2 (CEPHEID - Performed in [facility] lab), Hosp Order     Status: None   Collection Time: 05/30/19  4:36 PM   Specimen: Nasopharyngeal Swab  Result Value Ref Range Status   SARS Coronavirus 2 NEGATIVE NEGATIVE Final    Comment: (NOTE) If result is NEGATIVE SARS-CoV-2 target nucleic acids are NOT DETECTED. The SARS-CoV-2 RNA is generally detectable in upper and lower  respiratory specimens during the acute phase of infection. The lowest  concentration of SARS-CoV-2 viral copies this assay can detect is 250  copies / mL. A negative result does not preclude SARS-CoV-2 infection  and should not be used as the sole basis for treatment or other  patient management decisions.  A negative result may occur with  improper specimen collection / handling, submission of specimen other  than nasopharyngeal swab, presence of viral mutation(s) within the  areas targeted by this assay, and inadequate number of viral copies  (<250 copies / mL). A negative result must be combined with clinical  observations, patient history, and epidemiological information. If result is POSITIVE SARS-CoV-2 target nucleic acids are DETECTED. The SARS-CoV-2 RNA is generally detectable in upper and lower  respiratory specimens dur ing the acute phase of infection.  Positive  results are indicative of active infection with SARS-CoV-2.  Clinical  correlation with patient history and other diagnostic information is  necessary to determine patient infection status.  Positive results do  not rule out bacterial infection or co-infection with other viruses. If result is PRESUMPTIVE POSTIVE SARS-CoV-2 nucleic acids MAY BE PRESENT.   A presumptive positive result was obtained on the submitted specimen  and confirmed on repeat testing.  While 2019 novel coronavirus  (SARS-CoV-2) nucleic acids may be present in the submitted sample  additional confirmatory testing may be necessary for  epidemiological  and / or clinical management purposes  to differentiate between  SARS-CoV-2 and other Sarbecovirus currently known to infect humans.  If clinically indicated additional testing with an alternate test  methodology [phone]([id number]) is advised. The SARS-CoV-2 RNA is generally  detectable in upper and lower respiratory sp ecimens during the acute  phase of infection. The expected result is Negative. Fact Sheet for Patients:  BoilerBrush.com.cyhttps://www.fda.gov/media/136312/download Fact Sheet for Healthcare Providers: https://pope.com/https://www.fda.gov/media/136313/download This test is not yet approved or cleared by the [place] FDA and has been authorized for detection and/or diagnosis of SARS-CoV-2 by FDA under an Emergency Use Authorization (EUA).  This EUA will remain in effect (meaning this test can be used) for the duration of the COVID-19 declaration under Section 564(b)(1) of the Act, 21 U.S.C. section 360bbb-3(b)(1), unless the authorization is terminated or revoked sooner. Performed  at [facility], [place] [address], [place], [place] [address]      Radiological Exams on Admission: Ct Abdomen Pelvis Wo Contrast  Result Date: 05/30/2019 CLINICAL DATA:  Upper abdominal pain, nausea and vomiting since yesterday. EXAM: CT ABDOMEN AND PELVIS WITHOUT CONTRAST TECHNIQUE: Multidetector CT imaging of the abdomen and pelvis was performed following the standard protocol without IV contrast. COMPARISON:  None. FINDINGS: Lower chest: Mild scarring/atelectasis at the lung bases. Hepatobiliary: Status post cholecystectomy. Associated pneumobilia. No focal liver abnormality is seen. Pancreas: Unremarkable. No pancreatic ductal dilatation or surrounding inflammatory changes. Spleen: Normal in size without focal abnormality. Adrenals/Urinary Tract: Adrenal glands appear normal. Kidneys are unremarkable without mass, stone or hydronephrosis. No ureteral or bladder calculi. Bladder appears normal,  partially decompressed. Stomach/Bowel: Stool and oral contrast is present within the normal caliber colon. Mildly distended small bowel loops within the central abdomen to LEFT lower quadrant. No obstructing transition zone identified. No evidence of associated bowel wall inflammation. Stomach is unremarkable. Appendix is normal. Vascular/Lymphatic: Extensive aortic atherosclerosis. No enlarged lymph nodes seen. Reproductive: Uterus and bilateral adnexa are unremarkable. Other: No free fluid or abscess collection. No free intraperitoneal air. Musculoskeletal: No acute or suspicious osseous finding. Degenerative spondylosis of the lumbar spine, mild to moderate in degree. Chronic appearing compression deformities of the L3 and L4 vertebral bodies. IMPRESSION: 1. Mildly distended small bowel loops within the central abdomen and left lower quadrant, compatible with partial obstruction versus ileus. No obstructing transition zone identified. No obstructing mass or bowel wall inflammation. Stool and oral contrast within the normal caliber colon. 2. Additional chronic/incidental findings detailed above. Aortic Atherosclerosis (ICD10-I70.0). Electronically Signed   By: Franki Cabot M.D.   On: 05/30/2019 16:33    EKG: Normal sinus rhythm at 92 bpm with normal axis and nonspecific ST-T wave changes.   Assessment/Plan  This is a 85 y.o. female with a history of CVA, seizure, hypothyroidism, HTN, HLD, GERD now being admitted with:  #. Abdominal pain - partial SBO vs. Ileus - Admit inpatient - NPO, NGT per surgery recommendations - Aspiration precautions - IVFs - Pain control - Surgery has been consulted by EDP  #. Elevated troponin, likely demand ischemia - Monitor on tele - Trend trops - Cardio has been consulted by EDP.   #. History of hypothyroidism - Continue Synthroid  #. History of HTN - Continue lisinopril  #. History of HLD - Continue atorvastatin  #. History of GERD - Continue  Protonix for Prilosec  #. History of CVA - Change Eliquis to heparin while hospitalized  #. History of seizures - Continue Vimpat  Admission status: Inpatient, tele IV Fluids: NS Diet/Nutrition: NPO Consults called: Surgery and cardio  DVT Px: Heparin, SCDs and early ambulation. Code Status: DNR (form in chart) Disposition Plan: To home in 2-3 days  All the records are reviewed and case discussed with ED provider. Management plans discussed with the patient and/or family who express understanding and agree with plan of care.  Errik Mitchelle D.O. on 05/30/2019 at 6:58 PM CC: Primary care physician; Patient, No Pcp Per   05/30/2019, 6:58 PM

## 2019-05-30 NOTE — ED Triage Notes (Signed)
Pt BIB EMS from [place]. Pt c/o vomiting and abdominal pain since last night. Pt alert and oriented with EMS. Pt says pain is in upper abdomen.   162/106 BP

## 2019-05-30 NOTE — Progress Notes (Signed)
NGT Advanced 15 cm in

## 2019-05-30 NOTE — Progress Notes (Signed)
CRITICAL VALUE ALERT  Critical Value: Troponin .11  Date & Time Notied: 05/30/2019 2350 Provider Notified: [place]  Orders Received/Actions taken: None /NA

## 2019-05-30 NOTE — ED Notes (Signed)
Spoke with Mrs. Reynolds, pt daughter, and daughter is aware that labs have been drawn and are pending and CT scan will be done.

## 2019-05-31 ENCOUNTER — Inpatient Hospital Stay (HOSPITAL_COMMUNITY): Payer: Medicare Other

## 2019-05-31 DIAGNOSIS — E039 Hypothyroidism, unspecified: Secondary | ICD-10-CM

## 2019-05-31 DIAGNOSIS — Z8673 Personal history of transient ischemic attack (TIA), and cerebral infarction without residual deficits: Secondary | ICD-10-CM

## 2019-05-31 DIAGNOSIS — R9431 Abnormal electrocardiogram [ECG] [EKG]: Secondary | ICD-10-CM

## 2019-05-31 DIAGNOSIS — Z9989 Dependence on other enabling machines and devices: Secondary | ICD-10-CM

## 2019-05-31 DIAGNOSIS — R011 Cardiac murmur, unspecified: Secondary | ICD-10-CM

## 2019-05-31 DIAGNOSIS — R7989 Other specified abnormal findings of blood chemistry: Secondary | ICD-10-CM

## 2019-05-31 DIAGNOSIS — K56609 Unspecified intestinal obstruction, unspecified as to partial versus complete obstruction: Secondary | ICD-10-CM

## 2019-05-31 LAB — TROPONIN I
Troponin I: 0.32 ng/mL (ref <0.03)
Troponin I: 0.39 ng/mL (ref <0.03)

## 2019-05-31 LAB — ECHOCARDIOGRAM COMPLETE

## 2019-05-31 LAB — TSH: TSH: 2.129 u[IU]/mL (ref 0.350–4.500)

## 2019-05-31 LAB — GLUCOSE, CAPILLARY
Glucose-Capillary: 109 mg/dL — ABNORMAL HIGH (ref 70–99)
Glucose-Capillary: 78 mg/dL (ref 70–99)

## 2019-05-31 LAB — URINE CULTURE: Culture: <10000 — AB

## 2019-05-31 MED ORDER — ORAL CARE MOUTH RINSE
15.0000 mL | Freq: Two times a day (BID) | OROMUCOSAL | Status: DC
  Administered 2019-06-01 – 2019-06-03 (×4): 15 mL via OROMUCOSAL

## 2019-05-31 NOTE — Progress Notes (Signed)
PROGRESS NOTE    Raven Peterson  XNA:[id number] DOB: October 19, 1922 DOA: 05/30/2019 PCP: Patient, No Pcp Per    Brief Narrative:  85 year old female who presented abdominal pain and vomiting.  She does have significant past medical history for CVA, seizures, hypertension and hypothyroidism.  Reported acute epigastric abdominal pain associate with nausea but no vomiting, no bowel movement or flatus over the last 48 hours.  On her initial physical examination blood pressure 181/95, pulse rate 100, respiratory rate 20, oxygen saturation 92%, her lungs are clear to auscultation bilaterally heart S1-S2 present and rhythmic, abdomen was distended, diffusely tender to palpation, no rebound or guarding, decreased bowel sounds, no lower extremity edema.  Sodium 138, potassium 3.8, chloride 104, bicarb 24, glucose 103, BUN 16, creatinine 0.81, white count 10.8, hemoglobin 16.3, hematocrit 49.2, platelets 255, urinalysis 6-10 white cells.  Troponin 0.08, 0.10, 0.11, 0.32, 0.39 CT of the abdomen with mildly distended small bowel loops within the central abdomen and left lower quadrant, compatible with partial obstruction versus ileus.  No obstruction transition zone identified.  EKG 106 bpm, normal axis, normal intervals, sinus rhythm, Q waves V1 through V4, no significant ST segment or T wave changes.  Patient was admitted to the hospital working diagnosis of small bowel obstruction.  Assessment & Plan:   Principal Problem:   SBO (small bowel obstruction) (HCC) Active Problems:   History of stroke   Chronic anticoagulation   Hypothyroidism   Hyperlipidemia   Atherosclerosis of abdominal aorta (HCC)   Ileus (HCC)   Elevated troponin level   Dependent on walker for ambulation   1. Small partial bowel obstruction/ ileus. Patient with no nausea or vomiting, this am has tolerated clear liquid, no chest pain or dyspnea. Not feeling back to her baseline continue to be very weak and deconditioned. No leukocytosis.  Will continue conservative medical therapy with IV fluids, as needed IV analgesics and antiemetics. Continue to follow surgery recommendations.   2. Elevated troponin, possible demand ischemia. Patient has remained chest pain free, her EKG has q waves in the precordial septal leads (possible chronic), further work up with echocardiogram. No signs of acute coronary syndrome.   3. Hx of CVA. Patient with no confusion or agitation, will need physical therapy evaluation before discharge. Continue statin therapy. Continue to hold on apixaban for now in case of surgical intervention needed.   4. Seizures. No active disease, will continue vimpat per home regimen.   5. HTN. Continue blood pressure control with lisinopril.   6. Hypothyroid. Continue levothyroxine.   DVT prophylaxis: heparin   Code Status: dnr  Family Communication: no family at the bedside  Disposition Plan/ discharge barriers: pending clinical improvement  There is no height or weight on file to calculate BMI. Malnutrition Type:      Malnutrition Characteristics:      Nutrition Interventions:     RN Pressure Injury Documentation:     Consultants:   Surgery   Cardiology   Procedures:     Antimicrobials:      Subjective: Patient with improved abdominal pain, but not yet back to baseline, she was able to toleate clear liquids this am, no nausea or vomiting.   Objective: Vitals:   05/30/19 2020 05/30/19 2230 05/30/19 2315 05/31/19 0430  BP: (!) 198/81 (!) 165/90 (!) 151/68 (!) 144/64  Pulse: (!) 101 (!) 125 (!) 108 (!) 101  Resp: 20   20  Temp: 98.1 F (36.7 C)   97.6 F (36.4 C)  TempSrc: Oral  SpO2: 96% (!) 78% 98% 98%    Intake/Output Summary (Last 24 hours) at 05/31/2019 1218 Last data filed at 05/31/2019 0600 Gross per 24 hour  Intake 1525 ml  Output 650 ml  Net 875 ml   There were no vitals filed for this visit.  Examination:   General: deconditioned and ill looking appearing   Neurology: Awake and alert, non focal  E ENT: mild pallor, no icterus, oral mucosa moist Cardiovascular: No JVD. S1-S2 present, rhythmic, no gallops, rubs, or murmurs. No lower extremity edema. Pulmonary: positive breath sounds bilaterally, adequate air movement, no wheezing, rhonchi or rales. Gastrointestinal. Abdomen mild distention with  no organomegaly, non tender, no rebound or guarding Skin. No rashes Musculoskeletal: no joint deformities     Data Reviewed: I have personally reviewed following labs and imaging studies  CBC: Recent Labs  Lab 05/30/19 1325  WBC 10.8*  NEUTROABS 8.3*  HGB 16.3*  HCT 49.2*  MCV 96.5  PLT 255   Basic Metabolic Panel: Recent Labs  Lab 05/30/19 1325  NA 138  K 3.8  CL 104  CO2 24  GLUCOSE 103*  BUN 16  CREATININE 0.81  CALCIUM 9.2   GFR: CrCl cannot be calculated (Unknown ideal weight.). Liver Function Tests: Recent Labs  Lab 05/30/19 1325  AST 18  ALT 15  ALKPHOS 91  BILITOT 0.9  PROT 7.8  ALBUMIN 4.3   Recent Labs  Lab 05/30/19 1325  LIPASE 40   No results for input(s): AMMONIA in the last 168 hours. Coagulation Profile: No results for input(s): INR, PROTIME in the last 168 hours. Cardiac Enzymes: Recent Labs  Lab 05/30/19 1328 05/30/19 1730 05/30/19 2230 05/31/19 0331 05/31/19 0938  TROPONINI 0.08* 0.10* 0.11* 0.32* 0.39*   BNP (last 3 results) No results for input(s): PROBNP in the last 8760 hours. HbA1C: No results for input(s): HGBA1C in the last 72 hours. CBG: Recent Labs  Lab 05/31/19 0755 05/31/19 1142  GLUCAP 109* 78   Lipid Profile: Recent Labs    05/30/19 2230  CHOL 156  HDL 52  LDLCALC 83  TRIG 107  CHOLHDL 3.0   Thyroid Function Tests: Recent Labs    05/30/19 2230  TSH 2.129   Anemia Panel: No results for input(s): VITAMINB12, FOLATE, FERRITIN, TIBC, IRON, RETICCTPCT in the last 72 hours.    Radiology Studies: I have reviewed all of the imaging during this hospital  visit personally     Scheduled Meds: . aspirin EC  81 mg Oral Daily  . atorvastatin  40 mg Oral Daily  . benzonatate  100 mg Oral Q8H  . bisacodyl  10 mg Rectal Daily  . heparin  5,000 Units Subcutaneous Q8H  . lacosamide  100 mg Oral BID  . latanoprost  1 drop Both Eyes QHS  . levothyroxine  25 mcg Oral QAC breakfast  . lip balm  1 application Topical BID  . lisinopril  5 mg Oral Daily  . montelukast  10 mg Oral QHS  . pantoprazole  40 mg Oral Daily  . polyvinyl alcohol  1 drop Both Eyes Daily   Continuous Infusions: . sodium chloride 75 mL/hr at 05/30/19 2254  . chlorproMAZINE (THORAZINE) IV    . lactated ringers Stopped (05/30/19 2038)  . lactated ringers    . ondansetron (ZOFRAN) IV       LOS: 1 day        Raven Peterson Raven Gulaaniel Percell Lamboy, MD \

## 2019-05-31 NOTE — Progress Notes (Signed)
NGT unclamped to wall suction

## 2019-05-31 NOTE — Consult Note (Signed)
Cardiology Consultation:   Patient ID: Raven Peterson; [id number]; 02/12/1922   Admit date: 05/30/2019 Date of Consult: 05/31/2019  Primary Care Provider: Patient, No Pcp Per Primary Cardiologist: No primary care provider on file.  Primary Electrophysiologist:  None   Patient Profile:   Raven Peterson is a 85 y.o. female with a hx of HTN who is being seen today for the evaluation of elevated troponin  at the request of Dr. Ella Peterson.  History of Present Illness:   Raven Peterson has no prior cardiac history.  She did have a presumed previous embolic CVA bout not with   She is admitted with SBO after developing epigastric discomfort.  She had cardiac enzymes ordered in the ED without any acute EKG changes.  Trop was mildly elevated.    She says that she gets around with a walker for balance.  The patient denies any new symptoms such as chest discomfort, neck or arm discomfort. There has been no new shortness of breath, PND or orthopnea. There have been no reported palpitations, presyncope or syncope.  She has had a heart murmur for years.    She presented after she developed abdominal pain and had constipation for a few days.    Past Medical History:  Diagnosis Date   Acute ischemic stroke (HCC) 10/02/2014   right cerebellar and left posterior temporal CVA: This was confirmed on MRI. We suspected an embolic source given the distribution on MRI. She has no history of atrial fib and no atrial fib was seen on telemetry. She was seen by neurology in consultation and underwent carotid US which showed mild right carotid stenosis (<50%) and moderate left carotid stenosis (50-69%). She was treated with aspiri   CAP (community acquired pneumonia) 03/17/2015   Chronic back pain    "used to get shots in my back by a Pain Management doctor"   Hypertension    Inferior pubic ramus fracture, right, with routine healing, subsequent encounter    Seizure (HCC) 10/02/2014   Thyroid disease    UTI from E.coli  2018   Vertebral compression fracture [facility](HCC)     Past Surgical History:  Procedure Laterality Date   CATARACT EXTRACTION, BILATERAL     CHOLECYSTECTOMY       Home Medications:  Prior to Admission medications   Medication Sig Start Date End Date Taking? Authorizing Provider  acetaminophen (TYLENOL) 500 MG tablet Take 500 mg by mouth 2 (two) times daily as needed for mild pain.   Yes [provider]  apixaban (ELIQUIS) 2.5 MG TABS tablet Take 2.5 mg by mouth 2 (two) times daily. Reported on 04/03/2016 06/16/15 05/30/19 Yes [provider]  benzonatate (TESSALON) 100 MG capsule Take 1 capsule (100 mg total) by mouth every 8 (eight) hours. 07/07/18  Yes Peterson, Dorene GrebeNatalie B, NP  carboxymethylcellulose (REFRESH PLUS) 0.5 % SOLN Place 1 drop into both eyes every morning.   Yes [provider]  HYDROcodone-acetaminophen (NORCO/VICODIN) 5-325 MG tablet Take 1 tablet by mouth 3 (three) times daily as needed for moderate pain.  04/02/15  Yes [provider]  Lacosamide (VIMPAT) 100 MG TABS Take 100 mg by mouth 2 (two) times daily.  07/13/15 05/30/19 Yes [provider]  latanoprost (XALATAN) 0.005 % ophthalmic solution Place 1 drop into both eyes at bedtime.    Yes [provider]  levothyroxine (SYNTHROID, LEVOTHROID) 25 MCG tablet Take 25 mcg by mouth daily before breakfast.  07/13/15  Yes [provider]  lisinopril (PRINIVIL,ZESTRIL) 5 MG tablet  Take 5 mg by mouth daily.    Yes [provider]  methocarbamol (ROBAXIN) 500 MG tablet Take 500 mg by mouth 2 (two) times daily as needed for muscle spasms.   Yes [provider]  montelukast (SINGULAIR) 10 MG tablet Take by mouth at bedtime.    Yes [provider]  omeprazole (PRILOSEC) 20 MG capsule Take 20 mg by mouth daily.   Yes [provider]  atorvastatin (LIPITOR) 40 MG tablet Take 40 mg by mouth daily.    [provider]    Inpatient  Medications: Scheduled Meds:  aspirin EC  81 mg Oral Daily   atorvastatin  40 mg Oral Daily   benzonatate  100 mg Oral Q8H   bisacodyl  10 mg Rectal Daily   heparin  5,000 Units Subcutaneous Q8H   lacosamide  100 mg Oral BID   latanoprost  1 drop Both Eyes QHS   levothyroxine  25 mcg Oral QAC breakfast   lip balm  1 application Topical BID   lisinopril  5 mg Oral Daily   montelukast  10 mg Oral QHS   pantoprazole  40 mg Oral Daily   polyvinyl alcohol  1 drop Both Eyes Daily   Continuous Infusions:  sodium chloride 75 mL/hr at 05/30/19 2254   chlorproMAZINE (THORAZINE) IV     lactated ringers Stopped (05/30/19 2038)   lactated ringers     ondansetron (ZOFRAN) IV     PRN Meds: acetaminophen, alum & mag hydroxide-simeth, chlorproMAZINE (THORAZINE) IV, guaiFENesin-dextromethorphan, hydrocortisone, hydrocortisone cream, labetalol, lactated ringers, magic mouthwash, menthol-cetylpyridinium, methocarbamol, morphine injection, ondansetron (ZOFRAN) IV **OR** ondansetron (ZOFRAN) IV, phenol, prochlorperazine, promethazine  Allergies:    Allergies  Allergen Reactions   Cimetidine Other (See Comments)    Palpitations.    Naproxen Sodium Other (See Comments)    Affects glaucoma medication   Fluzone [Flu Virus Vaccine]     Unknown- listed on MAR   Fosamax [Alendronate Sodium]     Unknown- listed on MAR   Loratadine     Unknown- listed on MAR    Iodinated Diagnostic Agents Nausea Only    Social History:   Social History   Socioeconomic History   Marital status: Widowed    Spouse name: Not on file   Number of children: Not on file   Years of education: Not on file   Highest education level: 8th grade  Occupational History   Not on file  Social Needs   Financial resource strain: Not on file   Food insecurity    Worry: Not on file    Inability: Not on file   Transportation needs    Medical: Not on file    Non-medical: Not on file  Tobacco  Use   Smoking status: Never Smoker   Smokeless tobacco: Never Used  Substance and Sexual Activity   Alcohol use: No   Drug use: No   Sexual activity: Not on file  Lifestyle   Physical activity    Days per week: Not on file    Minutes per session: Not on file   Stress: Not on file  Relationships   Social connections    Talks on phone: Not on file    Gets together: Not on file    Attends religious service: Not on file    Active member of club or organization: Not on file    Attends meetings of clubs or organizations: Not on file    Relationship status: Not on file  Intimate partner violence    Fear of current or ex partner: Not on file    Emotionally abused: Not on file    Physically abused: Not on file    Forced sexual activity: Not on file  Other Topics Concern   Not on file  Social History Narrative   Right handed   She has eighth grade education.    She moved here from [place].    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   She does not smoke, use any alcohol or recreational drugs.    She is a widow.    She has 2 daughters and 2 sons.    She has been living in an assisted living facility in [place] since October 2017.     Family History:     Family History  Problem Relation Age of Onset   Healthy Mother    Asthma Father      ROS:  Please see the history of present illness.  ROS  All other ROS reviewed and negative.     Physical Exam/Data:   Vitals:   05/30/19 2020 05/30/19 2230 05/30/19 2315 05/31/19 0430  BP: (!) 198/81 (!) 165/90 (!) 151/68 (!) 144/64  Pulse: (!) 101 (!) 125 (!) 108 (!) 101  Resp: 20   20  Temp: 98.1 F (36.7 C)   97.6 F (36.4 C)  TempSrc: Oral     SpO2: 96% (!) 78% 98% 98%    Intake/Output Summary (Last 24 hours) at 05/31/2019 0850 Last data filed at 05/31/2019 0600 Gross per 24 hour  Intake 1525 ml  Output 650 ml  Net 875 ml    GENERAL:  Well appearing HEENT:   Pupils equal round and reactive, fundi not  visualized, oral mucosa unremarkable NECK:  No  jugular venous distention, waveform within normal limits, carotid upstroke brisk and symmetric, no bruits, no thyromegaly LYMPHATICS:  No cervical, inguinal adenopathy LUNGS:  Few basilar crackles.  BACK:  No CVA tenderness CHEST:   Unremarkable HEART:  PMI not displaced or sustained,S1 and S2 within normal limits, no S3, no S4, no clicks, no rubs, 2/6 apical systolic murmur mid peaking and radiating out the outflow tract, no diastolic murmurs ABD: Decreased bowel sounds, flat no bruits, no rebound, no guarding, no midline pulsatile mass, no hepatomegaly, no splenomegaly EXT:  2 plus pulses throughout, no  edema, no cyanosis no clubbing SKIN:  No rashes no nodules NEURO:   Cranial nerves II through XII grossly intact, motor grossly intact throughout PSYCH:    Cognitively intact, oriented to person place and time   EKG:  The EKG was personally reviewed and demonstrates:  NSR, rate 103, axis WNL, QT mildly prolonged.  Probable old anteroseptal MI, probable limb leads misplaced.  No acute ST T wave changes  Telemetry:  Telemetry was personally reviewed and demonstrates:  Sinus tach  Relevant CV Studies: NA  Laboratory Data:  Chemistry Recent Labs  Lab 05/30/19 1325  NA 138  K 3.8  CL 104  CO2 24  GLUCOSE 103*  BUN 16  CREATININE 0.81  CALCIUM 9.2  GFRNONAA >60  GFRAA >60  ANIONGAP 10    Recent Labs  Lab 05/30/19 1325  PROT 7.8  ALBUMIN 4.3  AST 18  ALT 15  ALKPHOS 91  BILITOT 0.9   Hematology Recent Labs  Lab 05/30/19 1325  WBC 10.8*  RBC 5.10  HGB 16.3*  HCT 49.2*  MCV 96.5  MCH 32.0  MCHC 33.1  RDW  12.6  PLT 255   Cardiac Enzymes Recent Labs  Lab 05/30/19 1328 05/30/19 1730 05/30/19 2230 05/31/19 0331  TROPONINI 0.08* 0.10* 0.11* 0.32*   No results for input(s): TROPIPOC in the last 168 hours.  BNPNo results for input(s): BNP, PROBNP in the last 168 hours.  DDimer No results for input(s): DDIMER in  the last 168 hours.  Radiology/Studies:  Ct Abdomen Pelvis Wo Contrast  Result Date: 05/30/2019 CLINICAL DATA:  Upper abdominal pain, nausea and vomiting since yesterday. EXAM: CT ABDOMEN AND PELVIS WITHOUT CONTRAST TECHNIQUE: Multidetector CT imaging of the abdomen and pelvis was performed following the standard protocol without IV contrast. COMPARISON:  None. FINDINGS: Lower chest: Mild scarring/atelectasis at the lung bases. Hepatobiliary: Status post cholecystectomy. Associated pneumobilia. No focal liver abnormality is seen. Pancreas: Unremarkable. No pancreatic ductal dilatation or surrounding inflammatory changes. Spleen: Normal in size without focal abnormality. Adrenals/Urinary Tract: Adrenal glands appear normal. Kidneys are unremarkable without mass, stone or hydronephrosis. No ureteral or bladder calculi. Bladder appears normal, partially decompressed. Stomach/Bowel: Stool and oral contrast is present within the normal caliber colon. Mildly distended small bowel loops within the central abdomen to LEFT lower quadrant. No obstructing transition zone identified. No evidence of associated bowel wall inflammation. Stomach is unremarkable. Appendix is normal. Vascular/Lymphatic: Extensive aortic atherosclerosis. No enlarged lymph nodes seen. Reproductive: Uterus and bilateral adnexa are unremarkable. Other: No free fluid or abscess collection. No free intraperitoneal air. Musculoskeletal: No acute or suspicious osseous finding. Degenerative spondylosis of the lumbar spine, mild to moderate in degree. Chronic appearing compression deformities of the L3 and L4 vertebral bodies. IMPRESSION: 1. Mildly distended small bowel loops within the central abdomen and left lower quadrant, compatible with partial obstruction versus ileus. No obstructing transition zone identified. No obstructing mass or bowel wall inflammation. Stool and oral contrast within the normal caliber colon. 2. Additional chronic/incidental  findings detailed above. Aortic Atherosclerosis (ICD10-I70.0). Electronically Signed   By: Bary Richard M.D.   On: 05/30/2019 16:33   Dg Abd Portable 1v-small Bowel Obstruction Protocol-initial, 8 Hr Delay  Result Date: 05/31/2019 CLINICAL DATA:  Small bowel obstruction, 8 hour delayed film EXAM: PORTABLE ABDOMEN - 1 VIEW COMPARISON:  05/30/2019 abdominal radiograph FINDINGS: Enteric tube terminates in the distal stomach. Cholecystectomy clips are seen in the right upper quadrant of the abdomen. Oral contrast transits to the rectum. No disproportionately dilated small bowel loops. No evidence of pneumatosis or pneumoperitoneum. IMPRESSION: 1. Enteric tube terminates in the distal stomach. 2. No disproportionately dilated small bowel loops. Oral contrast transits to the rectum. Findings are compatible with resolved small bowel obstruction. Electronically Signed   By: Delbert Phenix M.D.   On: 05/31/2019 07:59   Dg Abd Portable 1v-small Bowel Protocol-position Verification  Result Date: 05/30/2019 CLINICAL DATA:  Status post NG tube placement today. EXAM: PORTABLE ABDOMEN - 1 VIEW COMPARISON:  None. FINDINGS: NG tube is in place with the tip in the stomach. Side port is near the gastroesophageal junction. The tube could be advanced 2 cm for better positioning. IMPRESSION: As above. Electronically Signed   By: Drusilla Kanner M.D.   On: 05/30/2019 19:47    Assessment and Plan:   ELEVATED TROPONIN:    I suspect demand ischemia and I would not suggest further work up other than the echo if the enzymes trend down.  I will follow as an out patient and consider further testing in the future after consultation with her and her family but I suspect that continued appropriate follow  up with be conservative.  ABNORMAL EKG/MURMUR:  Check echocardiogram.   CVA:  Embolic by history but I see no evidence of arrhythmia.  However, I would resume the Eliquis when able and defer continued management to her PCP  HTN:    BP mildly elevated.  Trending down. Resume lisinopril when able.     For questions or updates, please contact [facility] Please consult [email or web address] for contact info under Cardiology/STEMI.   Signed, Rollene RotundaJames Alicha Raspberry, MD  05/31/2019 8:50 AM

## 2019-05-31 NOTE — Progress Notes (Signed)
CRITICAL VALUE ALERT  Critical Value:  Troponin .32  Date & Time Notied:  05/31/2019 0553  Provider Notified: Silas Sacramento  Orders Received/Actions taken:none

## 2019-05-31 NOTE — Plan of Care (Signed)
°  Problem: Elimination: °Goal: Will not experience complications related to bowel motility °Outcome: Progressing °  °Problem: Pain Managment: °Goal: General experience of comfort will improve °Outcome: Progressing °  °

## 2019-05-31 NOTE — Progress Notes (Signed)
SBO (small bowel obstruction) (HCC)  Subjective: Pt feeling better  Objective: Vital signs in last 24 hours: Temp:  [97.6 F (36.4 C)-98.1 F (36.7 C)] 97.6 F (36.4 C) (06/20 0430) Pulse Rate:  [87-125] 101 (06/20 0430) Resp:  [14-20] 20 (06/20 0430) BP: (144-198)/(64-95) 144/64 (06/20 0430) SpO2:  [78 %-98 %] 98 % (06/20 0430) Last BM Date: 05/30/19  Intake/Output from previous day: 06/19 0701 - 06/20 0700 In: 1525 [I.V.:525; IV Piggyback:1000] Out: 650 [Emesis/NG output:650] Intake/Output this shift: No intake/output data recorded.  General appearance: alert and cooperative GI: non-distended  Lab Results:  Results for orders placed or performed during the hospital encounter of 05/30/19 (from the past 24 hour(s))  CBC with Differential     Status: Abnormal   Collection Time: 05/30/19  1:25 PM  Result Value Ref Range   WBC 10.8 (H) 4.0 - 10.5 K/uL   RBC 5.10 3.87 - 5.11 MIL/uL   Hemoglobin 16.3 (H) 12.0 - 15.0 g/dL   HCT 49.2 (H) 36.0 - 46.0 %   MCV 96.5 80.0 - 100.0 fL   MCH 32.0 26.0 - 34.0 pg   MCHC 33.1 30.0 - 36.0 g/dL   RDW 12.6 11.5 - 15.5 %   Platelets 255 150 - 400 K/uL   nRBC 0.0 0.0 - 0.2 %   Neutrophils Relative % 76 %   Neutro Abs 8.3 (H) 1.7 - 7.7 K/uL   Lymphocytes Relative 15 %   Lymphs Abs 1.6 0.7 - 4.0 K/uL   Monocytes Relative 7 %   Monocytes Absolute 0.8 0.1 - 1.0 K/uL   Eosinophils Relative 1 %   Eosinophils Absolute 0.1 0.0 - 0.5 K/uL   Basophils Relative 1 %   Basophils Absolute 0.1 0.0 - 0.1 K/uL   Immature Granulocytes 0 %   Abs Immature Granulocytes 0.02 0.00 - 0.07 K/uL  Comprehensive metabolic panel     Status: Abnormal   Collection Time: 05/30/19  1:25 PM  Result Value Ref Range   Sodium 138 135 - 145 mmol/L   Potassium 3.8 3.5 - 5.1 mmol/L   Chloride 104 98 - 111 mmol/L   CO2 24 22 - 32 mmol/L   Glucose, Bld 103 (H) 70 - 99 mg/dL   BUN 16 8 - 23 mg/dL   Creatinine, Ser 0.81 0.44 - 1.00 mg/dL   Calcium 9.2 8.9 - 10.3 mg/dL    Total Protein 7.8 6.5 - 8.1 g/dL   Albumin 4.3 3.5 - 5.0 g/dL   AST 18 15 - 41 U/L   ALT 15 0 - 44 U/L   Alkaline Phosphatase 91 38 - 126 U/L   Total Bilirubin 0.9 0.3 - 1.2 mg/dL   GFR calc non Af Amer >60 >60 mL/min   GFR calc Af Amer >60 >60 mL/min   Anion gap 10 5 - 15  Lipase, blood     Status: None   Collection Time: 05/30/19  1:25 PM  Result Value Ref Range   Lipase 40 11 - 51 U/L  Troponin I - ONCE - STAT     Status: Abnormal   Collection Time: 05/30/19  1:28 PM  Result Value Ref Range   Troponin I 0.08 (HH) <0.03 ng/mL  Urinalysis, Routine w reflex microscopic     Status: Abnormal   Collection Time: 05/30/19  1:38 PM  Result Value Ref Range   Color, Urine STRAW (A) YELLOW   APPearance CLEAR CLEAR   Specific Gravity, Urine 1.008 1.005 - 1.030   pH  5.0 5.0 - 8.0   Glucose, UA NEGATIVE NEGATIVE mg/dL   Hgb urine dipstick SMALL (A) NEGATIVE   Bilirubin Urine NEGATIVE NEGATIVE   Ketones, ur NEGATIVE NEGATIVE mg/dL   Protein, ur NEGATIVE NEGATIVE mg/dL   Nitrite NEGATIVE NEGATIVE   Leukocytes,Ua SMALL (A) NEGATIVE   RBC / HPF 0-5 0 - 5 RBC/hpf   WBC, UA 6-10 0 - 5 WBC/hpf   Bacteria, UA RARE (A) NONE SEEN   Squamous Epithelial / LPF 0-5 0 - 5  SARS Coronavirus 2 (CEPHEID - [facility]formed [facility]), Hosp Order     Status: None   Collection Time: 05/30/19  4:36 PM   Specimen: Nasopharyngeal Swab  Result Value Ref Range   SARS Coronavirus 2 NEGATIVE NEGATIVE  Troponin I - ONCE - STAT     Status: Abnormal   Collection Time: 05/30/19  5:30 PM  Result Value Ref Range   Troponin I 0.10 (HH) <0.03 ng/mL  Troponin I - Now Then Q6H     Status: Abnormal   Collection Time: 05/30/19 10:30 PM  Result Value Ref Range   Troponin I 0.11 (HH) <0.03 ng/mL  TSH     Status: None   Collection Time: 05/30/19 10:30 PM  Result Value Ref Range   TSH 2.129 0.350 - 4.500 uIU/mL  Lipid panel     Status: None   Collection Time: 05/30/19 10:30 PM  Result Value Ref Range    Cholesterol 156 0 - 200 mg/dL   Triglycerides 578107 <469<150 mg/dL   HDL 52 >62>40 mg/dL   Total CHOL/HDL Ratio 3.0 RATIO   VLDL 21 0 - 40 mg/dL   LDL Cholesterol 83 0 - 99 mg/dL  Troponin I - Now Then Q6H     Status: Abnormal   Collection Time: 05/31/19  3:31 AM  Result Value Ref Range   Troponin I 0.32 (HH) <0.03 ng/mL  Glucose, capillary     Status: Abnormal   Collection Time: 05/31/19  7:55 AM  Result Value Ref Range   Glucose-Capillary 109 (H) 70 - 99 mg/dL     Studies/Results Radiology     MEDS, Scheduled . aspirin EC  81 mg Oral Daily  . atorvastatin  40 mg Oral Daily  . benzonatate  100 mg Oral Q8H  . bisacodyl  10 mg Rectal Daily  . heparin  5,000 Units Subcutaneous Q8H  . lacosamide  100 mg Oral BID  . latanoprost  1 drop Both Eyes QHS  . levothyroxine  25 mcg Oral QAC breakfast  . lip balm  1 application Topical BID  . lisinopril  5 mg Oral Daily  . montelukast  10 mg Oral QHS  . pantoprazole  40 mg Oral Daily  . polyvinyl alcohol  1 drop Both Eyes Daily     Assessment: SBO (small bowel obstruction) (HCC) Appears to be resolved with SB protocol  Plan: D/C NG and start clears Ok to advance diet as tolerated  LOS: 1 day    Vanita PandaAlicia C Corita Allinson, MD [facility], [place] [phone]   05/31/2019 8:58 AM

## 2019-05-31 NOTE — Plan of Care (Signed)

## 2019-05-31 NOTE — Progress Notes (Signed)
  Echocardiogram 2D Echocardiogram has been performed.  Raven Peterson 05/31/2019, 10:30 AM

## 2019-06-01 DIAGNOSIS — R0600 Dyspnea, unspecified: Secondary | ICD-10-CM | POA: Diagnosis not present

## 2019-06-01 DIAGNOSIS — Z7901 Long term (current) use of anticoagulants: Secondary | ICD-10-CM

## 2019-06-01 DIAGNOSIS — I10 Essential (primary) hypertension: Secondary | ICD-10-CM

## 2019-06-01 LAB — CBC WITH DIFFERENTIAL/PLATELET
Abs Immature Granulocytes: 0.02 10*3/uL (ref 0.00–0.07)
Basophils Absolute: 0 10*3/uL (ref 0.0–0.1)
Basophils Relative: 0 %
Eosinophils Absolute: 0.1 10*3/uL (ref 0.0–0.5)
Eosinophils Relative: 2 %
HCT: 42.3 % (ref 36.0–46.0)
Hemoglobin: 13.3 g/dL (ref 12.0–15.0)
Immature Granulocytes: 0 %
Lymphocytes Relative: 18 %
Lymphs Abs: 1.3 10*3/uL (ref 0.7–4.0)
MCH: 31.4 pg (ref 26.0–34.0)
MCHC: 31.4 g/dL (ref 30.0–36.0)
MCV: 100 fL (ref 80.0–100.0)
Monocytes Absolute: 0.7 10*3/uL (ref 0.1–1.0)
Monocytes Relative: 10 %
Neutro Abs: 4.8 10*3/uL (ref 1.7–7.7)
Neutrophils Relative %: 70 %
Platelets: 211 10*3/uL (ref 150–400)
RBC: 4.23 MIL/uL (ref 3.87–5.11)
RDW: 13.1 % (ref 11.5–15.5)
WBC: 6.8 10*3/uL (ref 4.0–10.5)
nRBC: 0 % (ref 0.0–0.2)

## 2019-06-01 LAB — BASIC METABOLIC PANEL
Anion gap: 8 (ref 5–15)
BUN: 10 mg/dL (ref 8–23)
CO2: 25 mmol/L (ref 22–32)
Calcium: 8.2 mg/dL — ABNORMAL LOW (ref 8.9–10.3)
Chloride: 108 mmol/L (ref 98–111)
Creatinine, Ser: 0.76 mg/dL (ref 0.44–1.00)
GFR calc Af Amer: >60 mL/min (ref >60)
GFR calc non Af Amer: >60 mL/min (ref >60)
Glucose, Bld: 107 mg/dL — ABNORMAL HIGH (ref 70–99)
Potassium: 3.6 mmol/L (ref 3.5–5.1)
Sodium: 141 mmol/L (ref 135–145)

## 2019-06-01 NOTE — Evaluation (Signed)
Physical Therapy Evaluation Patient Details Name: Raven Peterson MRN: [id number] DOB: 15-Jun-1922 Today's Date: 06/01/2019   History of Present Illness  85 yo female admitted with SBO. Hx of CVA, Sz  Clinical Impression  On eval, pt was Min guard-Min assist for mobility. She walked ~60 feet with a RW. She tolerated activity well. She did c/o some mild lightheadedness. Discussed d/c plan-pt plans to return to her ALF. Will follow during hospital stay. Recommendation is for HHPT f/u at this time (depending on progress). Pt may not need f/u if she progresses well.     Follow Up Recommendations Home health PT (depending on progress)    Equipment Recommendations  None recommended by PT    Recommendations for Other Services       Precautions / Restrictions Precautions Precautions: Fall Restrictions Weight Bearing Restrictions: No      Mobility  Bed Mobility Overal bed mobility: Needs Assistance Bed Mobility: Supine to Sit;Sit to Supine     Supine to sit: Supervision;HOB elevated Sit to supine: Min assist;HOB elevated   General bed mobility comments: small amount of assist for LEs onto bed. Increased time.  Transfers Overall transfer level: Needs assistance Equipment used: Rolling walker (2 wheeled) Transfers: Sit to/from Stand Sit to Stand: Min assist         General transfer comment: Assist from low surfaces. VCs safety, hand placement. Increased time.  Ambulation/Gait Ambulation/Gait assistance: Min guard Gait Distance (Feet): 60 Feet Assistive device: Rolling walker (2 wheeled) Gait Pattern/deviations: Step-through pattern;Decreased stride length     General Gait Details: close guard for safety. slow gait speed.  Stairs            Wheelchair Mobility    Modified Rankin (Stroke Patients Only)       Balance Overall balance assessment: Needs assistance         Standing balance support: Bilateral upper extremity supported Standing balance-Leahy Scale:  Poor                               Pertinent Vitals/Pain Pain Assessment: Faces Faces Pain Scale: Hurts a little bit Pain Location: generalized Pain Descriptors / Indicators: Sore Pain Intervention(s): Monitored during session    Home Living Family/patient expects to be discharged to:: Assisted living Living Arrangements: Alone   Type of Home: Assisted living Home Access: Level entry     Home Layout: One level Home Equipment: Environmental consultant - 4 wheels      Prior Function Level of Independence: Independent with assistive device(s)         Comments: uses rollator     Hand Dominance        Extremity/Trunk Assessment   Upper Extremity Assessment Upper Extremity Assessment: Generalized weakness    Lower Extremity Assessment Lower Extremity Assessment: Generalized weakness    Cervical / Trunk Assessment Cervical / Trunk Assessment: Kyphotic  Communication   Communication: No difficulties  Cognition Arousal/Alertness: Awake/alert Behavior During Therapy: WFL for tasks assessed/performed Overall Cognitive Status: Within Functional Limits for tasks assessed                                        General Comments      Exercises     Assessment/Plan    PT Assessment Patient needs continued PT services  PT Problem List Decreased strength;Decreased mobility;Decreased activity tolerance;Decreased balance  PT Treatment Interventions DME instruction;Gait training;Therapeutic exercise;Therapeutic activities;Patient/family education;Functional mobility training    PT Goals (Current goals can be found in the Care Plan section)  Acute Rehab PT Goals Patient Stated Goal: home soon PT Goal Formulation: With patient Time For Goal Achievement: 06/15/19 Potential to Achieve Goals: Good    Frequency Min 3X/week   Barriers to discharge        Co-evaluation               AM-PAC PT "6 Clicks" Mobility  Outcome Measure Help  needed turning from your back to your side while in a flat bed without using bedrails?: A Little Help needed moving from lying on your back to sitting on the side of a flat bed without using bedrails?: A Little Help needed moving to and from a bed to a chair (including a wheelchair)?: A Little Help needed standing up from a chair using your arms (e.g., wheelchair or bedside chair)?: A Little Help needed to walk in hospital room?: A Little Help needed climbing 3-5 steps with a railing? : A Lot 6 Click Score: 17    End of Session Equipment Utilized During Treatment: Gait belt Activity Tolerance: Patient tolerated treatment well Patient left: in bed;with bed alarm set;with call bell/phone within reach   PT Visit Diagnosis: Muscle weakness (generalized) (M62.81)    Time: [id number] PT Time Calculation (min) (ACUTE ONLY): 19 min   Charges:   PT Evaluation $PT Eval Moderate Complexity: 1 Mod          Rebeca AlertJannie Keyani Rigdon, PT Acute Rehabilitation Services Pager: [phone] Office: [phone] '

## 2019-06-01 NOTE — Progress Notes (Signed)
Patient was complaining of shortness of breath and she is wheezing. O2 sats was 95% on 1L of oxygen. O2 was bumped up to 2L. MD on call was notified, will continue to monitor.

## 2019-06-01 NOTE — Progress Notes (Signed)
SBO (small bowel obstruction) (HCC)  Subjective: Pt feeling better  Objective: Vital signs in last 24 hours: Temp:  [98 F (36.7 C)-98.5 F (36.9 C)] 98.5 F (36.9 C) (06/21 0457) Pulse Rate:  [86-97] 87 (06/21 0457) Resp:  [14-20] 20 (06/21 0457) BP: (123-136)/(57-70) 135/68 (06/21 0457) SpO2:  [95 %-98 %] 96 % (06/21 0457) Weight:  [68.6 kg] 68.6 kg (06/20 2012) Last BM Date: 05/31/19  Intake/Output from previous day: 06/20 0701 - 06/21 0700 In: 1610 [P.O.:360; I.V.:1250] Out: 400 [Urine:400] Intake/Output this shift: No intake/output data recorded.  General appearance: alert and cooperative GI: non-distended  Lab Results:  Results for orders placed or performed during the hospital encounter of 05/30/19 (from the past 24 hour(s))  Troponin I - Now Then Q6H     Status: Abnormal   Collection Time: 05/31/19  9:38 AM  Result Value Ref Range   Troponin I 0.39 (HH) <0.03 ng/mL  Glucose, capillary     Status: None   Collection Time: 05/31/19 11:42 AM  Result Value Ref Range   Glucose-Capillary 78 70 - 99 mg/dL  CBC with Differential/Platelet     Status: None   Collection Time: 06/01/19  4:17 AM  Result Value Ref Range   WBC 6.8 4.0 - 10.5 K/uL   RBC 4.23 3.87 - 5.11 MIL/uL   Hemoglobin 13.3 12.0 - 15.0 g/dL   HCT 13.242.3 44.036.0 - 10.246.0 %   MCV 100.0 80.0 - 100.0 fL   MCH 31.4 26.0 - 34.0 pg   MCHC 31.4 30.0 - 36.0 g/dL   RDW 72.513.1 36.611.5 - 44.015.5 %   Platelets 211 150 - 400 K/uL   nRBC 0.0 0.0 - 0.2 %   Neutrophils Relative % 70 %   Neutro Abs 4.8 1.7 - 7.7 K/uL   Lymphocytes Relative 18 %   Lymphs Abs 1.3 0.7 - 4.0 K/uL   Monocytes Relative 10 %   Monocytes Absolute 0.7 0.1 - 1.0 K/uL   Eosinophils Relative 2 %   Eosinophils Absolute 0.1 0.0 - 0.5 K/uL   Basophils Relative 0 %   Basophils Absolute 0.0 0.0 - 0.1 K/uL   Immature Granulocytes 0 %   Abs Immature Granulocytes 0.02 0.00 - 0.07 K/uL  Basic metabolic panel     Status: Abnormal   Collection Time: 06/01/19   4:17 AM  Result Value Ref Range   Sodium 141 135 - 145 mmol/L   Potassium 3.6 3.5 - 5.1 mmol/L   Chloride 108 98 - 111 mmol/L   CO2 25 22 - 32 mmol/L   Glucose, Bld 107 (H) 70 - 99 mg/dL   BUN 10 8 - 23 mg/dL   Creatinine, Ser 3.470.76 0.44 - 1.00 mg/dL   Calcium 8.2 (L) 8.9 - 10.3 mg/dL   GFR calc non Af Amer >60 >60 mL/min   GFR calc Af Amer >60 >60 mL/min   Anion gap 8 5 - 15     Studies/Results Radiology     MEDS, Scheduled . aspirin EC  81 mg Oral Daily  . atorvastatin  40 mg Oral Daily  . benzonatate  100 mg Oral Q8H  . bisacodyl  10 mg Rectal Daily  . heparin  5,000 Units Subcutaneous Q8H  . lacosamide  100 mg Oral BID  . latanoprost  1 drop Both Eyes QHS  . levothyroxine  25 mcg Oral QAC breakfast  . lip balm  1 application Topical BID  . lisinopril  5 mg Oral Daily  . mouth  rinse  15 mL Mouth Rinse BID  . montelukast  10 mg Oral QHS  . pantoprazole  40 mg Oral Daily  . polyvinyl alcohol  1 drop Both Eyes Daily     Assessment: SBO (small bowel obstruction) ([place]) Appears to be resolved with SB protocol  Plan: Soft diet today   LOS: 2 days    Rosario Adie, MD [facility], [place] [phone]   06/01/2019 8:13 AM

## 2019-06-01 NOTE — Plan of Care (Signed)
  Problem: Education: Goal: Knowledge of General Education information will improve Description: Including pain rating scale, medication(s)/side effects and non-pharmacologic comfort measures Outcome: Progressing   Problem: Activity: Goal: Risk for activity intolerance will decrease Outcome: Progressing   Problem: Nutrition: Goal: Adequate nutrition will be maintained Outcome: Progressing   Problem: Pain Managment: Goal: General experience of comfort will improve Outcome: Completed/Met   Problem: Skin Integrity: Goal: Risk for impaired skin integrity will decrease Outcome: Completed/Met

## 2019-06-01 NOTE — Progress Notes (Signed)
Progress Note  Patient Name: Raven Peterson Date of Encounter: 06/01/2019  Primary Cardiologist:   No primary care provider on file.   Subjective   She is frustrated and just weak.  No acute complaints.   Inpatient Medications    Scheduled Meds: . aspirin EC  81 mg Oral Daily  . atorvastatin  40 mg Oral Daily  . benzonatate  100 mg Oral Q8H  . bisacodyl  10 mg Rectal Daily  . heparin  5,000 Units Subcutaneous Q8H  . lacosamide  100 mg Oral BID  . latanoprost  1 drop Both Eyes QHS  . levothyroxine  25 mcg Oral QAC breakfast  . lip balm  1 application Topical BID  . lisinopril  5 mg Oral Daily  . mouth rinse  15 mL Mouth Rinse BID  . montelukast  10 mg Oral QHS  . pantoprazole  40 mg Oral Daily  . polyvinyl alcohol  1 drop Both Eyes Daily   Continuous Infusions: . sodium chloride 50 mL/hr at 06/01/19 0613  . chlorproMAZINE (THORAZINE) IV    . ondansetron (ZOFRAN) IV     PRN Meds: acetaminophen, alum & mag hydroxide-simeth, chlorproMAZINE (THORAZINE) IV, guaiFENesin-dextromethorphan, hydrocortisone, hydrocortisone cream, labetalol, magic mouthwash, menthol-cetylpyridinium, methocarbamol, morphine injection, ondansetron (ZOFRAN) IV **OR** ondansetron (ZOFRAN) IV, phenol, prochlorperazine, promethazine   Vital Signs    Vitals:   05/31/19 1339 05/31/19 1946 05/31/19 2012 06/01/19 0457  BP: 123/70 (!) 136/57  135/68  Pulse: 97 86  87  Resp: 14 19  20   Temp:  98 F (36.7 C)  98.5 F (36.9 C)  TempSrc:  Oral  Oral  SpO2: 98% 95%  96%  Weight:   68.6 kg   Height:   5\' 2"  (1.575 m)     Intake/Output Summary (Last 24 hours) at 06/01/2019 0823 Last data filed at 06/01/2019 0600 Gross per 24 hour  Intake 1610 ml  Output 400 ml  Net 1210 ml   Filed Weights   05/31/19 2012  Weight: 68.6 kg    Telemetry    NSR - Personally Reviewed  ECG    NA - Personally Reviewed  Physical Exam   GEN: No acute distress.   Neck: No  JVD Cardiac: RRR, 2/6 apical systolic  murmur, no diastolic murmurs, rubs, or gallops.  Respiratory: Clear  to auscultation bilaterally. GI: Soft, nontender, non-distended  MS: No  edema; No deformity. Neuro:  Nonfocal  Psych: Normal affect   Labs    Chemistry Recent Labs  Lab 05/30/19 1325 06/01/19 0417  NA 138 141  K 3.8 3.6  CL 104 108  CO2 24 25  GLUCOSE 103* 107*  BUN 16 10  CREATININE 0.81 0.76  CALCIUM 9.2 8.2*  PROT 7.8  --   ALBUMIN 4.3  --   AST 18  --   ALT 15  --   ALKPHOS 91  --   BILITOT 0.9  --   GFRNONAA >60 >60  GFRAA >60 >60  ANIONGAP 10 8     Hematology Recent Labs  Lab 05/30/19 1325 06/01/19 0417  WBC 10.8* 6.8  RBC 5.10 4.23  HGB 16.3* 13.3  HCT 49.2* 42.3  MCV 96.5 100.0  MCH 32.0 31.4  MCHC 33.1 31.4  RDW 12.6 13.1  PLT 255 211    Cardiac Enzymes Recent Labs  Lab 05/30/19 1730 05/30/19 2230 05/31/19 0331 05/31/19 0938  TROPONINI 0.10* 0.11* 0.32* 0.39*   No results for input(s): TROPIPOC in the last 168 hours.  BNPNo results for input(s): BNP, PROBNP in the last 168 hours.   DDimer No results for input(s): DDIMER in the last 168 hours.   Radiology    Ct Abdomen Pelvis Wo Contrast  Result Date: 05/30/2019 CLINICAL DATA:  Upper abdominal pain, nausea and vomiting since yesterday. EXAM: CT ABDOMEN AND PELVIS WITHOUT CONTRAST TECHNIQUE: Multidetector CT imaging of the abdomen and pelvis was performed following the standard protocol without IV contrast. COMPARISON:  None. FINDINGS: Lower chest: Mild scarring/atelectasis at the lung bases. Hepatobiliary: Status post cholecystectomy. Associated pneumobilia. No focal liver abnormality is seen. Pancreas: Unremarkable. No pancreatic ductal dilatation or surrounding inflammatory changes. Spleen: Normal in size without focal abnormality. Adrenals/Urinary Tract: Adrenal glands appear normal. Kidneys are unremarkable without mass, stone or hydronephrosis. No ureteral or bladder calculi. Bladder appears normal, partially  decompressed. Stomach/Bowel: Stool and oral contrast is present within the normal caliber colon. Mildly distended small bowel loops within the central abdomen to LEFT lower quadrant. No obstructing transition zone identified. No evidence of associated bowel wall inflammation. Stomach is unremarkable. Appendix is normal. Vascular/Lymphatic: Extensive aortic atherosclerosis. No enlarged lymph nodes seen. Reproductive: Uterus and bilateral adnexa are unremarkable. Other: No free fluid or abscess collection. No free intraperitoneal air. Musculoskeletal: No acute or suspicious osseous finding. Degenerative spondylosis of the lumbar spine, mild to moderate in degree. Chronic appearing compression deformities of the L3 and L4 vertebral bodies. IMPRESSION: 1. Mildly distended small bowel loops within the central abdomen and left lower quadrant, compatible with partial obstruction versus ileus. No obstructing transition zone identified. No obstructing mass or bowel wall inflammation. Stool and oral contrast within the normal caliber colon. 2. Additional chronic/incidental findings detailed above. Aortic Atherosclerosis (ICD10-I70.0). Electronically Signed   By: Bary RichardStan  Maynard M.D.   On: 05/30/2019 16:33   Dg Abd Portable 1v-small Bowel Obstruction Protocol-initial, 8 Hr Delay  Result Date: 05/31/2019 CLINICAL DATA:  Small bowel obstruction, 8 hour delayed film EXAM: PORTABLE ABDOMEN - 1 VIEW COMPARISON:  05/30/2019 abdominal radiograph FINDINGS: Enteric tube terminates in the distal stomach. Cholecystectomy clips are seen in the right upper quadrant of the abdomen. Oral contrast transits to the rectum. No disproportionately dilated small bowel loops. No evidence of pneumatosis or pneumoperitoneum. IMPRESSION: 1. Enteric tube terminates in the distal stomach. 2. No disproportionately dilated small bowel loops. Oral contrast transits to the rectum. Findings are compatible with resolved small bowel obstruction.  Electronically Signed   By: Delbert PhenixJason A Poff M.D.   On: 05/31/2019 07:59   Dg Abd Portable 1v-small Bowel Protocol-position Verification  Result Date: 05/30/2019 CLINICAL DATA:  Status post NG tube placement today. EXAM: PORTABLE ABDOMEN - 1 VIEW COMPARISON:  None. FINDINGS: NG tube is in place with the tip in the stomach. Side port is near the gastroesophageal junction. The tube could be advanced 2 cm for better positioning. IMPRESSION: As above. Electronically Signed   By: Drusilla Kannerhomas  Dalessio M.D.   On: 05/30/2019 19:47    Cardiac Studies   ECHO   1. The left ventricle has hyperdynamic systolic function, with an ejection fraction of >65%. The cavity size was normal. There is mild asymmetric left ventricular hypertrophy. Left ventricular diastolic Doppler parameters are consistent with impaired  relaxation.  2. Mildly accelerated blood flow, mid cavitary to distal LVOT consistent with hyperdynamic function (2.4673m/s).  3. The right ventricle has normal systolic function. The cavity was normal. There is no increase in right ventricular wall thickness.  4. The mitral valve is degenerative. Mild thickening of the mitral valve  leaflet. Mild calcification of the mitral valve leaflet.  5. The aortic valve is tricuspid. Moderate thickening of the aortic valve. Moderate calcification of the aortic valve. No stenosis of the aortic valve.   Patient Profile     85 y.o. female with a hx of HTN who is being seen for the evaluation of elevated troponin  at the request of Dr. Cathlean Sauer.  She is being managed for SBO.    Assessment & Plan    ELEVATED TROPONIN:    Trop has been slightly elevated but trend is flat.  Again suspecting demand ischemia.  No further in patient work up is planned at this time. She is not complaining of pain and would want conservative therapy.   SBO/WEAKNESS:  I will take the liberty of ordering PT for ambulation.   ABNORMAL EKG/MURMUR:   Probably some dynamic outflow obstruction.  No  change in therapy.    CVA:   Resume anticoagulation when able and when it is felt that she will not require any invasive procedures.     HTN:   Lisinopril restarted.  OK to use this as her LV outflow obstruction does not appear to be severe.   For questions or updates, please contact [place] Please consult [email or web address] for contact info under Cardiology/STEMI.   Signed, Minus Breeding, MD  06/01/2019, 8:23 AM

## 2019-06-01 NOTE — Progress Notes (Signed)
PROGRESS NOTE    Raven Peterson  ZHY:[id number] DOB: [birth year] DOA: 05/30/2019 PCP: Patient, No Pcp Per    Brief Narrative:  85 year old female who presented abdominal pain and vomiting.  She does have significant past medical history for CVA, seizures, hypertension and hypothyroidism.  Reported acute epigastric abdominal pain associate with nausea but no vomiting, no bowel movement or flatus over the last 48 hours.  On her initial physical examination blood pressure 181/95, pulse rate 100, respiratory rate 20, oxygen saturation 92%, her lungs are clear to auscultation bilaterally heart S1-S2 present and rhythmic, abdomen was distended, diffusely tender to palpation, no rebound or guarding, decreased bowel sounds, no lower extremity edema.  Sodium 138, potassium 3.8, chloride 104, bicarb 24, glucose 103, BUN 16, creatinine 0.81, white count 10.8, hemoglobin 16.3, hematocrit 49.2, platelets 255, urinalysis 6-10 white cells.  Troponin 0.08, 0.10, 0.11, 0.32, 0.39 CT of the abdomen with mildly distended small bowel loops within the central abdomen and left lower quadrant, compatible with partial obstruction versus ileus.  No obstruction transition zone identified.  EKG 106 bpm, normal axis, normal intervals, sinus rhythm, Q waves V1 through V4, no significant ST segment or T wave changes.  Patient was admitted to the hospital working diagnosis of small bowel obstruction.   Assessment & Plan:   Principal Problem:   SBO (small bowel obstruction) (HCC) Active Problems:   History of stroke   Chronic anticoagulation   Hypothyroidism   Hyperlipidemia   Atherosclerosis of abdominal aorta (HCC)   Ileus (HCC)   Elevated troponin level   Dependent on walker for ambulation   1. Small partial bowel obstruction/ ileus. Her symptoms continue to improve, today had bowel movement and her diet has been advanced. No leukocytosis, no further nausea or vomiting. Will monitor toleration to enteral intake. Dc IV  fluids. Continue as needed analgesics and antiemetics.   2. Elevated troponin, possible demand ischemia. No chest pain, echocardiogram with preserved LV systolic function, EF 29% with no wall motion abnormalities, mild LV asymmetric hypertrophy. Ruled out for acute coronary syndrome.   3. Hx of CVA. On statin therapy, will plan to resume anticoagulation at discharge. Continue physical therapy evaluation. Continue asa and atorvastatin.   4. Seizures. Continue with vimpat per home regimen.   5. HTN. Lisinopril for blood pressure control.   6. Hypothyroid. On levothyroxine.   DVT prophylaxis: heparin   Code Status: dnr  Family Communication: no family at the bedside  Disposition Plan/ discharge barriers: plan discharge in am 06/22 if continue clinical improvement.     Body mass index is 27.66 kg/m. Malnutrition Type:      Malnutrition Characteristics:      Nutrition Interventions:     RN Pressure Injury Documentation:     Consultants:   Surgery   Procedures:     Antimicrobials:       Subjective: Patient is feeling better, but not yet back to baseline, her diet has been advanced with good toleration, no nausea or vomiting. Positive bowel movement.   Objective: Vitals:   05/31/19 1339 05/31/19 1946 05/31/19 2012 06/01/19 0457  BP: 123/70 (!) 136/57  135/68  Pulse: 97 86  87  Resp: 14 19  20   Temp:  98 F (36.7 C)  98.5 F (36.9 C)  TempSrc:  Oral  Oral  SpO2: 98% 95%  96%  Weight:   68.6 kg   Height:   5\' 2"  (1.575 m)     Intake/Output Summary (Last 24 hours) at 06/01/2019 1204  Last data filed at 06/01/2019 1139 Gross per 24 hour  Intake 1850 ml  Output 750 ml  Net 1100 ml   Filed Weights   05/31/19 2012  Weight: 68.6 kg    Examination:   General: deconditioned  Neurology: Awake and alert, non focal  E ENT: mild pallor, no icterus, oral mucosa moist Cardiovascular: No JVD. S1-S2 present, rhythmic, no gallops, rubs, or murmurs. No  lower extremity edema. Pulmonary: Positive breath sounds bilaterally, adequate air movement, no wheezing, rhonchi or rales. Gastrointestinal. Abdomen mild distended with no organomegaly, non tender, no rebound or guarding Skin. No rashes Musculoskeletal: no joint deformities     Data Reviewed: I have personally reviewed following labs and imaging studies  CBC: Recent Labs  Lab 05/30/19 1325 06/01/19 0417  WBC 10.8* 6.8  NEUTROABS 8.3* 4.8  HGB 16.3* 13.3  HCT 49.2* 42.3  MCV 96.5 100.0  PLT 255 211   Basic Metabolic Panel: Recent Labs  Lab 05/30/19 1325 06/01/19 0417  NA 138 141  K 3.8 3.6  CL 104 108  CO2 24 25  GLUCOSE 103* 107*  BUN 16 10  CREATININE 0.81 0.76  CALCIUM 9.2 8.2*   GFR: Estimated Creatinine Clearance: 39 mL/min (by C-G formula based on SCr of 0.76 mg/dL). Liver Function Tests: Recent Labs  Lab 05/30/19 1325  AST 18  ALT 15  ALKPHOS 91  BILITOT 0.9  PROT 7.8  ALBUMIN 4.3   Recent Labs  Lab 05/30/19 1325  LIPASE 40   No results for input(s): AMMONIA in the last 168 hours. Coagulation Profile: No results for input(s): INR, PROTIME in the last 168 hours. Cardiac Enzymes: Recent Labs  Lab 05/30/19 1328 05/30/19 1730 05/30/19 2230 05/31/19 0331 05/31/19 0938  TROPONINI 0.08* 0.10* 0.11* 0.32* 0.39*   BNP (last 3 results) No results for input(s): PROBNP in the last 8760 hours. HbA1C: No results for input(s): HGBA1C in the last 72 hours. CBG: Recent Labs  Lab 05/31/19 0755 05/31/19 1142  GLUCAP 109* 78   Lipid Profile: Recent Labs    05/30/19 2230  CHOL 156  HDL 52  LDLCALC 83  TRIG 107  CHOLHDL 3.0   Thyroid Function Tests: Recent Labs    05/30/19 2230  TSH 2.129   Anemia Panel: No results for input(s): VITAMINB12, FOLATE, FERRITIN, TIBC, IRON, RETICCTPCT in the last 72 hours.    Radiology Studies: I have reviewed all of the imaging during this hospital visit personally     Scheduled Meds: . aspirin  EC  81 mg Oral Daily  . atorvastatin  40 mg Oral Daily  . benzonatate  100 mg Oral Q8H  . bisacodyl  10 mg Rectal Daily  . heparin  5,000 Units Subcutaneous Q8H  . lacosamide  100 mg Oral BID  . latanoprost  1 drop Both Eyes QHS  . levothyroxine  25 mcg Oral QAC breakfast  . lip balm  1 application Topical BID  . lisinopril  5 mg Oral Daily  . mouth rinse  15 mL Mouth Rinse BID  . montelukast  10 mg Oral QHS  . pantoprazole  40 mg Oral Daily  . polyvinyl alcohol  1 drop Both Eyes Daily   Continuous Infusions: . sodium chloride 50 mL/hr at 06/01/19 0613  . chlorproMAZINE (THORAZINE) IV    . ondansetron (ZOFRAN) IV       LOS: 2 days        Mauricio Annett Gulaaniel Arrien, MD

## 2019-06-01 NOTE — Progress Notes (Signed)
Patient ID: Raven Peterson, female   DOB: July 29, 1922, 85 y.o.   MRN: [id number]  Raven Peterson by RN due to dyspnea,  Apparently started at a little before 7pm and she placed the pt on o2 2L [place],  Pt denies cp, palp, n/v.  No hx of asthma/ Copd.   A/P Dyspnea CXR  Abg Will consider further intervention depending upon results of above

## 2019-06-02 ENCOUNTER — Inpatient Hospital Stay (HOSPITAL_COMMUNITY): Payer: Medicare Other

## 2019-06-02 DIAGNOSIS — I5033 Acute on chronic diastolic (congestive) heart failure: Secondary | ICD-10-CM

## 2019-06-02 DIAGNOSIS — R0603 Acute respiratory distress: Secondary | ICD-10-CM

## 2019-06-02 DIAGNOSIS — I700 Atherosclerosis of aorta: Secondary | ICD-10-CM

## 2019-06-02 DIAGNOSIS — I5031 Acute diastolic (congestive) heart failure: Secondary | ICD-10-CM

## 2019-06-02 DIAGNOSIS — J9601 Acute respiratory failure with hypoxia: Secondary | ICD-10-CM

## 2019-06-02 LAB — COMPREHENSIVE METABOLIC PANEL
ALT: 10 U/L (ref 0–44)
AST: 15 U/L (ref 15–41)
Albumin: 3 g/dL — ABNORMAL LOW (ref 3.5–5.0)
Alkaline Phosphatase: 79 U/L (ref 38–126)
Anion gap: 10 (ref 5–15)
BUN: 12 mg/dL (ref 8–23)
CO2: 21 mmol/L — ABNORMAL LOW (ref 22–32)
Calcium: 8.1 mg/dL — ABNORMAL LOW (ref 8.9–10.3)
Chloride: 108 mmol/L (ref 98–111)
Creatinine, Ser: 1.09 mg/dL — ABNORMAL HIGH (ref 0.44–1.00)
GFR calc Af Amer: 50 mL/min — ABNORMAL LOW (ref >60)
GFR calc non Af Amer: 43 mL/min — ABNORMAL LOW (ref >60)
Glucose, Bld: 228 mg/dL — ABNORMAL HIGH (ref 70–99)
Potassium: 3.9 mmol/L (ref 3.5–5.1)
Sodium: 139 mmol/L (ref 135–145)
Total Bilirubin: 0.9 mg/dL (ref 0.3–1.2)
Total Protein: 6.2 g/dL — ABNORMAL LOW (ref 6.5–8.1)

## 2019-06-02 LAB — CBC WITH DIFFERENTIAL/PLATELET
Abs Immature Granulocytes: 0.03 10*3/uL (ref 0.00–0.07)
Basophils Absolute: 0 10*3/uL (ref 0.0–0.1)
Basophils Relative: 0 %
Eosinophils Absolute: 0 10*3/uL (ref 0.0–0.5)
Eosinophils Relative: 0 %
HCT: 46.7 % — ABNORMAL HIGH (ref 36.0–46.0)
Hemoglobin: 14.6 g/dL (ref 12.0–15.0)
Immature Granulocytes: 0 %
Lymphocytes Relative: 9 %
Lymphs Abs: 0.8 10*3/uL (ref 0.7–4.0)
MCH: 31.7 pg (ref 26.0–34.0)
MCHC: 31.3 g/dL (ref 30.0–36.0)
MCV: 101.5 fL — ABNORMAL HIGH (ref 80.0–100.0)
Monocytes Absolute: 0.7 10*3/uL (ref 0.1–1.0)
Monocytes Relative: 8 %
Neutro Abs: 7.3 10*3/uL (ref 1.7–7.7)
Neutrophils Relative %: 83 %
Platelets: 219 10*3/uL (ref 150–400)
RBC: 4.6 MIL/uL (ref 3.87–5.11)
RDW: 12.7 % (ref 11.5–15.5)
WBC: 8.8 10*3/uL (ref 4.0–10.5)
nRBC: 0 % (ref 0.0–0.2)

## 2019-06-02 LAB — BLOOD GAS, ARTERIAL
Acid-base deficit: 2.8 mmol/L — ABNORMAL HIGH (ref 0.0–2.0)
Bicarbonate: 23.8 mmol/L (ref 20.0–28.0)
Delivery systems: POSITIVE
Drawn by: 232811
O2 Content: 15 L/min
O2 Saturation: 85.3 %
Patient temperature: 97.3
pCO2 arterial: 47.9 mmHg (ref 32.0–48.0)
pH, Arterial: 7.313 — ABNORMAL LOW (ref 7.350–7.450)
pO2, Arterial: 53.9 mmHg — ABNORMAL LOW (ref 83.0–108.0)

## 2019-06-02 LAB — TROPONIN I
Troponin I: 0.2 ng/mL (ref <0.03)
Troponin I: 0.4 ng/mL (ref <0.03)
Troponin I: 0.42 ng/mL (ref <0.03)
Troponin I: 0.08 ng/mL (ref <0.03)

## 2019-06-02 LAB — CBC
HCT: 47.5 % — ABNORMAL HIGH (ref 36.0–46.0)
Hemoglobin: 15.3 g/dL — ABNORMAL HIGH (ref 12.0–15.0)
MCH: 32.3 pg (ref 26.0–34.0)
MCHC: 32.2 g/dL (ref 30.0–36.0)
MCV: 100.4 fL — ABNORMAL HIGH (ref 80.0–100.0)
Platelets: 295 10*3/uL (ref 150–400)
RBC: 4.73 MIL/uL (ref 3.87–5.11)
RDW: 12.7 % (ref 11.5–15.5)
WBC: 14.6 10*3/uL — ABNORMAL HIGH (ref 4.0–10.5)
nRBC: 0 % (ref 0.0–0.2)

## 2019-06-02 LAB — D-DIMER, QUANTITATIVE: D-Dimer, Quant: 1.44 ug/mL-FEU — ABNORMAL HIGH (ref 0.00–0.50)

## 2019-06-02 LAB — MRSA PCR SCREENING: MRSA by PCR: NEGATIVE

## 2019-06-02 MED ORDER — POTASSIUM CHLORIDE CRYS ER 20 MEQ PO TBCR
40.0000 meq | EXTENDED_RELEASE_TABLET | Freq: Once | ORAL | Status: AC
  Administered 2019-06-02: 40 meq via ORAL
  Filled 2019-06-02: qty 2

## 2019-06-02 MED ORDER — FUROSEMIDE 10 MG/ML IJ SOLN
40.0000 mg | Freq: Once | INTRAMUSCULAR | Status: AC
  Administered 2019-06-02: 40 mg via INTRAVENOUS
  Filled 2019-06-02: qty 4

## 2019-06-02 MED ORDER — CHLORHEXIDINE GLUCONATE CLOTH 2 % EX PADS
6.0000 | MEDICATED_PAD | Freq: Every day | CUTANEOUS | Status: DC
  Administered 2019-06-02: 6 via TOPICAL

## 2019-06-02 MED ORDER — ONDANSETRON HCL 4 MG/2ML IJ SOLN
4.0000 mg | Freq: Once | INTRAMUSCULAR | Status: AC
  Administered 2019-06-02: 01:00:00 4 mg via INTRAVENOUS

## 2019-06-02 MED ORDER — FUROSEMIDE 10 MG/ML IJ SOLN
40.0000 mg | Freq: Once | INTRAMUSCULAR | Status: AC
  Administered 2019-06-02: 40 mg via INTRAVENOUS

## 2019-06-02 NOTE — Care Management Important Message (Signed)
Important Message  Patient Details IM Letter given to Rhea Pink SW to present to the Patient Name: Raven Peterson MRN: [id number] Date of Birth: 1922-08-03   Medicare Important Message Given:  Yes     Kerin Salen 06/02/2019, 2:07 PM

## 2019-06-02 NOTE — Progress Notes (Signed)
Progress Note  Patient Name: Raven Peterson Date of Encounter: 06/02/2019  Primary Cardiologist: New to [facility]; Dr. Percival Spanish  Subjective   She had acute SOB last night with volume overload.    Inpatient Medications    Scheduled Meds: . aspirin EC  81 mg Oral Daily  . atorvastatin  40 mg Oral Daily  . benzonatate  100 mg Oral Q8H  . bisacodyl  10 mg Rectal Daily  . Chlorhexidine Gluconate Cloth  6 each Topical Daily  . heparin  5,000 Units Subcutaneous Q8H  . lacosamide  100 mg Oral BID  . latanoprost  1 drop Both Eyes QHS  . levothyroxine  25 mcg Oral QAC breakfast  . lip balm  1 application Topical BID  . lisinopril  5 mg Oral Daily  . mouth rinse  15 mL Mouth Rinse BID  . montelukast  10 mg Oral QHS  . pantoprazole  40 mg Oral Daily  . polyvinyl alcohol  1 drop Both Eyes Daily   Continuous Infusions: . chlorproMAZINE (THORAZINE) IV    . ondansetron (ZOFRAN) IV     PRN Meds: acetaminophen, alum & mag hydroxide-simeth, chlorproMAZINE (THORAZINE) IV, guaiFENesin-dextromethorphan, hydrocortisone, hydrocortisone cream, labetalol, magic mouthwash, menthol-cetylpyridinium, methocarbamol, morphine injection, ondansetron (ZOFRAN) IV **OR** ondansetron (ZOFRAN) IV, phenol, prochlorperazine, promethazine   Vital Signs    Vitals:   06/02/19 0304 06/02/19 0347 06/02/19 0400 06/02/19 0500  BP: (!) 120/48  129/70 (!) 141/62  Pulse: 81  86 86  Resp: (!) 25  (!) 24 (!) 27  Temp:  (!) 97.3 F (36.3 C)    TempSrc:  Axillary    SpO2: 97%  95% 98%  Weight:      Height:        Intake/Output Summary (Last 24 hours) at 06/02/2019 0816 Last data filed at 06/02/2019 0600 Gross per 24 hour  Intake 1340 ml  Output 1600 ml  Net -260 ml   Filed Weights   05/31/19 2012  Weight: 68.6 kg    Telemetry    NSR - Personally Reviewed  ECG    EKG this morning with sinus rhythm, rate 92, no STE/D, no changes from previous; QTC 472 - Personally Reviewed  Physical Exam  Physical  exam per MD: GEN: No acute distress.   Neck: No JVD, no carotid bruits Cardiac:  RRR, no murmurs, rubs, or gallops.  Respiratory:   Bilateral basilar crackles.   GI: NABS, Soft, nontender, non-distended  MS: No edema; No deformity. Neuro:  Nonfocal, moving all extremities spontaneously Psych: Normal affect   Labs    Chemistry Recent Labs  Lab 05/30/19 1325 06/01/19 0417 06/02/19 0035  NA 138 141 139  K 3.8 3.6 3.9  CL 104 108 108  CO2 24 25 21*  GLUCOSE 103* 107* 228*  BUN 16 10 12   CREATININE 0.81 0.76 1.09*  CALCIUM 9.2 8.2* 8.1*  PROT 7.8  --  6.2*  ALBUMIN 4.3  --  3.0*  AST 18  --  15  ALT 15  --  10  ALKPHOS 91  --  79  BILITOT 0.9  --  0.9  GFRNONAA >60 >60 43*  GFRAA >60 >60 50*  ANIONGAP 10 8 10      Hematology Recent Labs  Lab 06/01/19 0417 06/02/19 0035 06/02/19 0649  WBC 6.8 14.6* 8.8  RBC 4.23 4.73 4.60  HGB 13.3 15.3* 14.6  HCT 42.3 47.5* 46.7*  MCV 100.0 100.4* 101.5*  MCH 31.4 32.3 31.7  MCHC 31.4 32.2  31.3  RDW 13.1 12.7 12.7  PLT 211 295 219    Cardiac Enzymes Recent Labs  Lab 05/30/19 2230 05/31/19 0331 05/31/19 0938 06/02/19 0035  TROPONINI 0.11* 0.32* 0.39* 0.20*   No results for input(s): TROPIPOC in the last 168 hours.   BNPNo results for input(s): BNP, PROBNP in the last 168 hours.   DDimer  Recent Labs  Lab 06/02/19 0035  DDIMER 1.44*     Radiology    Dg Chest Port 1 View  Result Date: 06/02/2019 CLINICAL DATA:  Dyspnea. EXAM: PORTABLE CHEST 1 VIEW COMPARISON:  Radiograph 07/07/2018 FINDINGS: Upper normal heart size with aortic atherosclerosis. Diffuse interstitial opacities suggesting pulmonary edema. More patchy airspace disease in the bilateral mid lung zones infrahilar regions. Possible small pleural effusions. No pneumothorax. IMPRESSION: Diffuse interstitial opacities suggesting pulmonary edema, with more patchy consolidation in the mid lung zones and infrahilar regions. Suspect pleural effusions. Findings may  be due to fluid overload, infection, or combination thereof. Electronically Signed   By: Narda RutherfordMelanie  Sanford M.D.   On: 06/02/2019 00:50    Cardiac Studies   Echocardiogram 05/31/2019: IMPRESSIONS    1. The left ventricle has hyperdynamic systolic function, with an ejection fraction of >65%. The cavity size was normal. There is mild asymmetric left ventricular hypertrophy. Left ventricular diastolic Doppler parameters are consistent with impaired  relaxation.  2. Mildly accelerated blood flow, mid cavitary to distal LVOT consistent with hyperdynamic function (2.5880m/s).  3. The right ventricle has normal systolic function. The cavity was normal. There is no increase in right ventricular wall thickness.  4. The mitral valve is degenerative. Mild thickening of the mitral valve leaflet. Mild calcification of the mitral valve leaflet.  5. The aortic valve is tricuspid. Moderate thickening of the aortic valve. Moderate calcification of the aortic valve. No stenosis of the aortic valve.  Patient Profile     85 y.o. female with PMH of HTN, CVA, seizure disorder, and hypothyroidism, who presented with abdominal pain and was found to have an SBO. Cardiology following for elevated troponins.   Assessment & Plan    1. Elevated troponins: trop trend 0.1>0.11>0.32>0.39>0.20. Trend relatively flat and not consistent with ACS. Felt to be demand ischemia. Echo with EF >65% and without RWMA.  - No further inpatient ischemic evaluation. Can consider outpatient testing after resolution of present illness  2. Dyspnea: patient reported acute SOB overnight requiring NRB. CXR with concerns for pulmonary edema. She was given IV lasix 40mg  x1 with UOP net -410mL in the past 24 hours and +1.6L this admission. Ddimer elevated and VQ Scan is pending - F/u VQ scan results to r/o PE  3. SBO: slowly resolving with conservative management - Continue management per primary team/surgery  4. HTN: BP overall stable  -  Continue lisinopril  5. CVA:  - Resume apixaban 2.5mg  BID when it is clear no further procedures will be needed  6. HLD: - Continue  statin  For questions or updates, please contact [facility] Please consult [email or web address] for contact info under Cardiology/STEMI.      Signed, Beatriz StallionKrista M. Kroeger, PA-C  06/02/2019, 8:16 AM   [phone]  History and all data above reviewed.  Patient examined.   She still has some SOB.  She has an exam as below.  Not quite at baseline.   I agree with the findings as above.  The patient exam reveals COR:RRR  ,  Lungs: Bilateral crackles  ,  Abd: Positive bowel sounds, no rebound no guarding, Ext  No edema  .  All available labs, radiology testing, previous records reviewed. Agree with documented assessment and plan.   Acute Diastolic HF:  I will give lasix IV today. Still with evidence of volume overload.   Fayrene FearingJames Nyshaun Standage  11:20 AM  06/02/2019

## 2019-06-02 NOTE — NC FL2 (Signed)
[place] LEVEL OF CARE SCREENING TOOL     IDENTIFICATION  Patient Name: Raven Peterson Birthdate: [birth year] Sex: female Admission Date (Current Location): 05/30/2019  [facility] and [place] Number:  Herbalist and Address:  [facility],  [place] [place], [place]      Provider Number: [id number]  Attending Physician Name and Address:  Tawni Millers  Relative Name and Phone Number:  [phone]    Current Level of Care: Hospital Recommended Level of Care: Assisted Living Facility([facility]) Prior Approval Number:    Date Approved/Denied:   PASRR Number:    Discharge Plan: [facility])    Current Diagnoses: Patient Active Problem List   Diagnosis Date Noted  . Acute diastolic HF (heart failure) ([place])   . Dyspnea 06/01/2019  . Chronic anticoagulation 05/30/2019  . Hypothyroidism 05/30/2019  . Hyperlipidemia 05/30/2019  . Atherosclerosis of abdominal aorta ([place]) 05/30/2019  . Ileus ([place]) 05/30/2019  . SBO (small bowel obstruction) ([place]) 05/30/2019  . Elevated troponin level 05/30/2019  . Dependent on walker for ambulation 05/30/2019  . Essential hypertension 10/02/2014  . GERD (gastroesophageal reflux disease) 10/02/2014  . Glaucoma 10/02/2014  . History of stroke 05/29/2014    Orientation RESPIRATION BLADDER Height & Weight     Self, Time, Situation, Place  O2(2L) Continent Weight: 151 lb 3.8 oz (68.6 kg) Height:  5\' 2"  (157.5 cm)  BEHAVIORAL SYMPTOMS/MOOD NEUROLOGICAL BOWEL NUTRITION STATUS      Continent Diet(soft diet)  AMBULATORY STATUS COMMUNICATION OF NEEDS Skin   Limited Assist Verbally                         Personal Care Assistance Level of Assistance  Bathing, Feeding, Dressing Bathing Assistance: Limited assistance Feeding assistance: Independent Dressing Assistance: Limited assistance     Functional Limitations Info  Sight, Hearing, Speech Sight Info:  Adequate Hearing Info: Adequate Speech Info: Adequate    SPECIAL CARE FACTORS FREQUENCY  PT (By licensed PT), OT (By licensed OT)     PT Frequency: 3x wk home health OT Frequency: 3x home health            Contractures Contractures Info: Not present    Additional Factors Info  Code Status, Allergies Code Status Info: DNR Allergies Info: Cimetidine Naproxen Sodium Fluzone Flu Virus Vaccine Fosamax Alendronate Sodium Loratadine Iodinated Diagnostic Agents           Current Medications (06/02/2019):  This is the current hospital active medication list Current Facility-Administered Medications  Medication Dose Route Frequency Provider Last Rate Last Dose  . acetaminophen (TYLENOL) tablet 650 mg  650 mg Oral Q4H PRN Hugelmeyer, Alexis, DO      . alum & mag hydroxide-simeth (MAALOX/MYLANTA) 200-200-20 MG/5ML suspension 30 mL  30 mL Oral Q6H PRN Michael Boston, MD      . aspirin EC tablet 81 mg  81 mg Oral Daily Hugelmeyer, Alexis, DO   81 mg at 06/02/19 1028  . atorvastatin (LIPITOR) tablet 40 mg  40 mg Oral Daily Hugelmeyer, Alexis, DO   40 mg at 06/02/19 1028  . benzonatate (TESSALON) capsule 100 mg  100 mg Oral Q8H Hugelmeyer, Alexis, DO   100 mg at 06/02/19 1408  . bisacodyl (DULCOLAX) suppository 10 mg  10 mg Rectal Daily Michael Boston, MD      . Chlorhexidine Gluconate Cloth 2 % PADS 6 each  6 each Topical Daily Arrien, Jimmy Picket, MD  6 each at 06/02/19 1029  . chlorproMAZINE (THORAZINE) 25 mg in sodium chloride 0.9 % 25 mL IVPB  25 mg Intravenous Q6H PRN Karie SodaGross, Steven, MD      . guaiFENesin-dextromethorphan (ROBITUSSIN DM) 100-10 MG/5ML syrup 10 mL  10 mL Oral Q4H PRN Karie SodaGross, Steven, MD   10 mL at 06/01/19 2242  . heparin injection 5,000 Units  5,000 Units Subcutaneous Q8H Hugelmeyer, Alexis, DO   5,000 Units at 06/02/19 1408  . hydrocortisone (ANUSOL-HC) 2.5 % rectal cream 1 application  1 application Topical QID PRN Karie SodaGross, Steven, MD      . hydrocortisone cream 1 % 1  application  1 application Topical TID PRN Karie SodaGross, Steven, MD      . labetalol (NORMODYNE) injection 5 mg  5 mg Intravenous Q2H PRN Hugelmeyer, Alexis, DO   5 mg at 06/02/19 0003  . lacosamide (VIMPAT) tablet 100 mg  100 mg Oral BID Hugelmeyer, Alexis, DO   100 mg at 06/02/19 1028  . latanoprost (XALATAN) 0.005 % ophthalmic solution 1 drop  1 drop Both Eyes QHS Hugelmeyer, Alexis, DO   1 drop at 06/01/19 2118  . levothyroxine (SYNTHROID) tablet 25 mcg  25 mcg Oral QAC breakfast Hugelmeyer, Alexis, DO   25 mcg at 06/02/19 0612  . lip balm (CARMEX) ointment 1 application  1 application Topical BID Karie SodaGross, Steven, MD   1 application at 06/01/19 2118  . lisinopril (ZESTRIL) tablet 5 mg  5 mg Oral Daily Hugelmeyer, Alexis, DO   5 mg at 06/02/19 1028  . magic mouthwash  15 mL Oral QID PRN Karie SodaGross, Steven, MD   15 mL at 05/31/19 1757  . MEDLINE mouth rinse  15 mL Mouth Rinse BID Arrien, York RamMauricio Daniel, MD   15 mL at 06/02/19 1029  . menthol-cetylpyridinium (CEPACOL) lozenge 3 mg  1 lozenge Oral PRN Karie SodaGross, Steven, MD      . methocarbamol (ROBAXIN) tablet 500 mg  500 mg Oral BID PRN Hugelmeyer, Alexis, DO      . montelukast (SINGULAIR) tablet 10 mg  10 mg Oral QHS Hugelmeyer, Alexis, DO   10 mg at 06/01/19 2118  . morphine 2 MG/ML injection 2 mg  2 mg Intravenous Q2H PRN Hugelmeyer, Alexis, DO      . ondansetron (ZOFRAN) injection 4 mg  4 mg Intravenous Q6H PRN Karie SodaGross, Steven, MD   4 mg at 05/31/19 0530   Or  . ondansetron (ZOFRAN) 8 mg in sodium chloride 0.9 % 50 mL IVPB  8 mg Intravenous Q6H PRN Karie SodaGross, Steven, MD      . pantoprazole (PROTONIX) EC tablet 40 mg  40 mg Oral Daily Hugelmeyer, Alexis, DO   40 mg at 06/02/19 1028  . phenol (CHLORASEPTIC) mouth spray 1-2 spray  1-2 spray Mouth/Throat PRN Karie SodaGross, Steven, MD      . polyvinyl alcohol (LIQUIFILM TEARS) 1.4 % ophthalmic solution 1 drop  1 drop Both Eyes Daily Len ChildsBell, Michelle T, RPH   1 drop at 06/01/19 1041  . prochlorperazine (COMPAZINE) injection 5-10  mg  5-10 mg Intravenous Q4H PRN Karie SodaGross, Steven, MD      . promethazine (PHENERGAN) suppository 12.5 mg  12.5 mg Rectal Q6H PRN Karie SodaGross, Steven, MD         Discharge Medications: Please see discharge summary for a list of discharge medications.  Relevant Imaging Results:  Relevant Lab Results:   Additional Information SS# [id number]  Althea CharonAshley C Islah Eve, LCSW

## 2019-06-02 NOTE — Progress Notes (Signed)
Received pt to 1424. Pt oriented to room. Pt placed on tele and verified. Pt denies pain. Pt bed alarm activated. Will cont to monitor. SRP,RN

## 2019-06-02 NOTE — TOC Initial Note (Signed)
Transition of Care [facility]) - Initial/Assessment Note    Patient Details  Name: Raven Peterson MRN: [id number] Date of Birth: 1922/05/29  [facility] CM/SW Contact:    Wende Neighbors, LCSW Phone Number: 06/02/2019, 2:27 PM  Clinical Narrative:     Spoke with patient at bedside to discuss discharge plans. Patient is from [facility]. Patient stated she has been at the facility for 3 years and enjoys it. Patient gave CSW verbal permission to speak to [facility] about patients discharge needs. CSW spoke with [place] at [facility] and they stated they will be able to accommodate patients needs back at [facility]              Expected Discharge Plan: Assisted Living Barriers to Discharge: Continued Medical Work up   Patient Goals and CMS Choice        Expected Discharge Plan and Services Expected Discharge Plan: Assisted Living In-house Referral: Clinical Social Work     Living arrangements for the past 2 months: [place] Expected Discharge Date: 06/10/19                                    Prior Living Arrangements/Services Living arrangements for the past 2 months: [place] Lives with:: Facility Resident Patient language and need for interpreter reviewed:: Yes Do you feel safe going back to the place where you live?: Yes      Need for Family Participation in Patient Care: Yes (Comment) Care giver support system in place?: Yes (comment)   Criminal Activity/Legal Involvement Pertinent to Current Situation/Hospitalization: No - Comment as needed  Activities of Daily Living Home Assistive Devices/Equipment: Eyeglasses, Hearing aid, Walker (specify type) ADL Screening (condition at time of admission) Patient's cognitive ability adequate to safely complete daily activities?: Yes Is the patient deaf or have difficulty hearing?: Yes Does the patient have difficulty seeing, even when wearing  glasses/contacts?: No Does the patient have difficulty concentrating, remembering, or making decisions?: No Patient able to express need for assistance with ADLs?: Yes Does the patient have difficulty dressing or bathing?: Yes Independently performs ADLs?: No Communication: Independent Dressing (OT): Needs assistance Is this a change from baseline?: Pre-admission baseline Grooming: Independent Feeding: Independent Bathing: Needs assistance Is this a change from baseline?: Pre-admission baseline Toileting: Needs assistance Is this a change from baseline?: Pre-admission baseline In/Out Bed: Needs assistance Is this a change from baseline?: Pre-admission baseline Walks in Home: Needs assistance, Independent with device (comment) Is this a change from baseline?: Pre-admission baseline Does the patient have difficulty walking or climbing stairs?: Yes Weakness of Legs: Both Weakness of Arms/Hands: None  Permission Sought/Granted Permission sought to share information with : Family Supports Permission granted to share information with : Yes, Verbal Permission Granted  Share Information with NAME: [name] [phone]  Permission granted to share info w AGENC[facility]: [facility]  Permission granted to share info w Relationship: daughter  Permission granted to share info w Contact Information: [phone]  Emotional Assessment Appearance:: Appears stated age Attitude/Demeanor/Rapport: Engaged Affect (typically observed): Accepting Orientation: : Oriented to Self, Oriented to Place, Oriented to  Time, Oriented to Situation Alcohol / Substance Use: Not Applicable Psych Involvement: No (comment)  Admission diagnosis:  Small bowel obstruction ([place]) [K56.609] Encounter for imaging study to confirm nasogastric (NG) tube placement [Z01.89] Patient Active Problem List   Diagnosis Date Noted  . Acute diastolic HF (  heart failure) (HCC)   . Dyspnea 06/01/2019  . Chronic  anticoagulation 05/30/2019  . Hypothyroidism 05/30/2019  . Hyperlipidemia 05/30/2019  . Atherosclerosis of abdominal aorta (HCC) 05/30/2019  . Ileus (HCC) 05/30/2019  . SBO (small bowel obstruction) (HCC) 05/30/2019  . Elevated troponin level 05/30/2019  . Dependent on walker for ambulation 05/30/2019  . Essential hypertension 10/02/2014  . GERD (gastroesophageal reflux disease) 10/02/2014  . Glaucoma 10/02/2014  . History of stroke 05/29/2014   PCP:  Patient, No Pcp Per [facility]:   [facility] [address] [place], [place] - [address] [place] [address] Phone: [phone] Fax: [phone]     Social Determinants of Health (SDOH) Interventions    Readmission Risk Interventions No flowsheet data found.

## 2019-06-02 NOTE — Progress Notes (Signed)
Lonn [place], RN notified Dr Maudie Mercury of abg result drawn on NRB, not bipap.

## 2019-06-02 NOTE — Progress Notes (Signed)
Xcover Pt apparently having some respiratory distress  A/P Acute Respiratory Failure CXR (please get ASAP) Abg 12 lead EKG Cbc, cmp, d dimer, trop I q6h x3 Rapid response team please  Contacted [facility] physician to clinically evaluate, appreciate input

## 2019-06-02 NOTE — Progress Notes (Signed)
PROGRESS NOTE    Raven Peterson  XBJ:[id number] DOB: 07/28/1922 DOA: 05/30/2019 PCP: Patient, No Pcp Per    Brief Narrative:  85 year old female who presented abdominal pain and vomiting. She does have significant past medical history for CVA, seizures, hypertension and hypothyroidism. Reported acute epigastric abdominal pain associate with nausea but no vomiting, no bowel movement or flatusover the last 48 hours.On her initial physical examination blood pressure 181/95, pulse rate 100, respiratory rate 20, oxygen saturation 92%, her lungs are clear to auscultation bilaterally heart S1-S2 present and rhythmic, abdomen was distended, diffusely tender to palpation, no rebound or guarding, decreased bowel sounds, no lower extremity edema. Sodium 138, potassium 3.8, chloride 104, bicarb 24, glucose 103, BUN 16, creatinine 0.81, white count 10.8, hemoglobin 16.3, hematocrit 49.2, platelets 255,urinalysis 6-10 white cells. Troponin 0.08, 0.10, 0.11, 0.32, 0.39 CT of the abdomen with mildly distended small bowel loops within the central abdomen and left lower quadrant, compatible with partial obstruction versus ileus. No obstruction transition zone identified. EKG 106 bpm, normal axis, normal intervals, sinus rhythm, Q waves V1 through V4, no significant ST segment or T wave changes.  Patient was admitted to the hospital working diagnosis of small bowel obstruction.   Assessment & Plan:   Principal Problem:   Dyspnea Active Problems:   History of stroke   Chronic anticoagulation   Hypothyroidism   Hyperlipidemia   Atherosclerosis of abdominal aorta (HCC)   Ileus (HCC)   SBO (small bowel obstruction) (HCC)   Elevated troponin level   Dependent on walker for ambulation   1. Acute hypoxic respiratory failure due to acute pulmonary edema, due to volume overload. Patient developed acute hypoxic respiratory failure last night, required non invasive mechanical ventilation and IV diuresis, her  chest film (personally reviewed) showed bilateral worsening interstitial infiltrates. She received 40 mg IV furosemide, urine output 1,750 ml, with improvement of her symptoms. This am she is off Bipap. D dimer elevated and V/Q has been ordered. This am her volume has improved, will continue oxymetry monitoring, wean off supplemental 02 and transfer to telemetry. Will order KL 40 meq, post diuresis hypokalemia.   2. Small partial bowel obstruction/ ileus. Clinically resolved, patient tolerating po well.   2. Elevated troponin, possible demand ischemia/ ruled out for ACS. LV systolic function preserved with no wall motion abnormalities. Troponin continue trending down. EKG personally reviewed, not change from admission, with rate of 92 bpm, normal axis and normal intervals, sinus rhythm with anterior septal J point elevation and q waves V1 to V3, no T wave changes.   3. Hx of CVA. Continue atorvastatin, will return to SNF at discharge.   4. Seizures. Patient on Vimpat with no signs of seizure activity.    5. HTN. Blood pressure 129 to 141 mmHg systolic. Will continue blood pressure monitoring. At home patient not on diuretic therapy.   6. Hypothyroid. Continue levothyroxine.  DVT prophylaxis:heparin Code Status:dnr Family Communication:no family at the bedside Disposition Plan/ discharge barriers:plan discharge in am 06/23 if continue clinical improvement.    Body mass index is 27.66 kg/m. Malnutrition Type:      Malnutrition Characteristics:      Nutrition Interventions:     RN Pressure Injury Documentation:     Consultants:   Surgery   Cardiology   Procedures:     Antimicrobials:       Subjective: Patient had severe dyspnea last night, along with hypoxemia, transferred to step down unit for non invasive mechanical ventilation and diuresis. This am  her breathing has improved, no chest pain, no cough, no nausea or vomiting.   Objective: Vitals:    06/02/19 0304 06/02/19 0347 06/02/19 0400 06/02/19 0500  BP: (!) 120/48  129/70 (!) 141/62  Pulse: 81  86 86  Resp: (!) 25  (!) 24 (!) 27  Temp:  (!) 97.3 F (36.3 C)    TempSrc:  Axillary    SpO2: 97%  95% 98%  Weight:      Height:        Intake/Output Summary (Last 24 hours) at 06/02/2019 0804 Last data filed at 06/02/2019 0600 Gross per 24 hour  Intake 1340 ml  Output 1600 ml  Net -260 ml   Filed Weights   05/31/19 2012  Weight: 68.6 kg    Examination:   General: deconditioned  Neurology: Awake and alert, non focal  E ENT: mild pallor, no icterus, oral mucosa moist Cardiovascular: No JVD. S1-S2 present, rhythmic, no gallops, rubs, or murmurs. No lower extremity edema. Pulmonary: positive breath sounds bilaterally, adequate air movement, no wheezing, no rhonchi, very mild dry rales at bases.  Gastrointestinal. Abdomen with no organomegaly, non tender, no rebound or guarding Skin. No rashes Musculoskeletal: no joint deformities     Data Reviewed: I have personally reviewed following labs and imaging studies  CBC: Recent Labs  Lab 05/30/19 1325 06/01/19 0417 06/02/19 0035 06/02/19 0649  WBC 10.8* 6.8 14.6* PENDING  NEUTROABS 8.3* 4.8  --  PENDING  HGB 16.3* 13.3 15.3* 14.6  HCT 49.2* 42.3 47.5* 46.7*  MCV 96.5 100.0 100.4* 101.5*  PLT 255 211 295 751   Basic Metabolic Panel: Recent Labs  Lab 05/30/19 1325 06/01/19 0417 06/02/19 0035  NA 138 141 139  K 3.8 3.6 3.9  CL 104 108 108  CO2 24 25 21*  GLUCOSE 103* 107* 228*  BUN 16 10 12   CREATININE 0.81 0.76 1.09*  CALCIUM 9.2 8.2* 8.1*   GFR: Estimated Creatinine Clearance: 28.6 mL/min (A) (by C-G formula based on SCr of 1.09 mg/dL (H)). Liver Function Tests: Recent Labs  Lab 05/30/19 1325 06/02/19 0035  AST 18 15  ALT 15 10  ALKPHOS 91 79  BILITOT 0.9 0.9  PROT 7.8 6.2*  ALBUMIN 4.3 3.0*   Recent Labs  Lab 05/30/19 1325  LIPASE 40   No results for input(s): AMMONIA in the last 168  hours. Coagulation Profile: No results for input(s): INR, PROTIME in the last 168 hours. Cardiac Enzymes: Recent Labs  Lab 05/30/19 1730 05/30/19 2230 05/31/19 0331 05/31/19 0938 06/02/19 0035  TROPONINI 0.10* 0.11* 0.32* 0.39* 0.20*   BNP (last 3 results) No results for input(s): PROBNP in the last 8760 hours. HbA1C: No results for input(s): HGBA1C in the last 72 hours. CBG: Recent Labs  Lab 05/31/19 0755 05/31/19 1142  GLUCAP 109* 78   Lipid Profile: Recent Labs    05/30/19 2230  CHOL 156  HDL 52  LDLCALC 83  TRIG 107  CHOLHDL 3.0   Thyroid Function Tests: Recent Labs    05/30/19 2230  TSH 2.129   Anemia Panel: No results for input(s): VITAMINB12, FOLATE, FERRITIN, TIBC, IRON, RETICCTPCT in the last 72 hours.    Radiology Studies: I have reviewed all of the imaging during this hospital visit personally     Scheduled Meds: . aspirin EC  81 mg Oral Daily  . atorvastatin  40 mg Oral Daily  . benzonatate  100 mg Oral Q8H  . bisacodyl  10 mg Rectal Daily  .  Chlorhexidine Gluconate Cloth  6 each Topical Daily  . heparin  5,000 Units Subcutaneous Q8H  . lacosamide  100 mg Oral BID  . latanoprost  1 drop Both Eyes QHS  . levothyroxine  25 mcg Oral QAC breakfast  . lip balm  1 application Topical BID  . lisinopril  5 mg Oral Daily  . mouth rinse  15 mL Mouth Rinse BID  . montelukast  10 mg Oral QHS  . pantoprazole  40 mg Oral Daily  . polyvinyl alcohol  1 drop Both Eyes Daily   Continuous Infusions: . chlorproMAZINE (THORAZINE) IV    . ondansetron (ZOFRAN) IV       LOS: 3 days        Raven Peterson Annett Gulaaniel Joli Koob, MD

## 2019-06-02 NOTE — Significant Event (Addendum)
Called to assess patient for acute-onset SOB.   CXR, ABG, d-dimer, EKG, and troponin ordered by floor coverage but not yet performed.   She is alert, tachypneic to 30, severely hypertensive, and saturating 89% on NRB.   BP improved with labetalol IVP.   Lasix 40 mg IV ordered. She is being transferred to SDU for BiPAP. Will follow-up pending studies.   Daughter was updated by phone.

## 2019-06-02 NOTE — Progress Notes (Signed)
Abg drawn on 15L non-rebreather, not bipap.  RN aware and will notify md.

## 2019-06-02 NOTE — Progress Notes (Signed)
Patient ID: Raven Peterson, female   DOB: July 26, 1922, 85 y.o.   MRN: [id number]       Subjective: Patient states she got moved overnight due to some SOB.  On NRB and on [place] this am  No abdominal problems.  No nausea.  Hungry and wanting to eat.  Wanting to have a BM  Objective: Vital signs in last 24 hours: Temp:  [97.3 F (36.3 C)-98.9 F (37.2 C)] 97.3 F (36.3 C) (06/22 0347) Pulse Rate:  [79-101] 86 (06/22 0500) Resp:  [16-40] 27 (06/22 0500) BP: (120-173)/(48-79) 141/62 (06/22 0500) SpO2:  [95 %-99 %] 98 % (06/22 0500) FiO2 (%):  [50 %] 50 % (06/22 0117) Last BM Date: 06/01/19  Intake/Output from previous day: 06/21 0701 - 06/22 0700 In: 1340 [P.O.:540; I.V.:800] Out: 1750 [Urine:1750] Intake/Output this shift: No intake/output data recorded.  PE: Abd: soft, NT, ND, +BS  Lab Results:  Recent Labs    06/02/19 0035 06/02/19 0649  WBC 14.6* 8.8  HGB 15.3* 14.6  HCT 47.5* 46.7*  PLT 295 219   BMET Recent Labs    06/01/19 0417 06/02/19 0035  NA 141 139  K 3.6 3.9  CL 108 108  CO2 25 21*  GLUCOSE 107* 228*  BUN 10 12  CREATININE 0.76 1.09*  CALCIUM 8.2* 8.1*   PT/INR No results for input(s): LABPROT, INR in the last 72 hours. CMP     Component Value Date/Time   NA 139 06/02/2019 0035   K 3.9 06/02/2019 0035   CL 108 06/02/2019 0035   CO2 21 (L) 06/02/2019 0035   GLUCOSE 228 (H) 06/02/2019 0035   BUN 12 06/02/2019 0035   CREATININE 1.09 (H) 06/02/2019 0035   CALCIUM 8.1 (L) 06/02/2019 0035   PROT 6.2 (L) 06/02/2019 0035   ALBUMIN 3.0 (L) 06/02/2019 0035   AST 15 06/02/2019 0035   ALT 10 06/02/2019 0035   ALKPHOS 79 06/02/2019 0035   BILITOT 0.9 06/02/2019 0035   GFRNONAA 43 (L) 06/02/2019 0035   GFRAA 50 (L) 06/02/2019 0035   Lipase     Component Value Date/Time   LIPASE 40 05/30/2019 1325       Studies/Results: Dg Chest Port 1 View  Result Date: 06/02/2019 CLINICAL DATA:  Dyspnea. EXAM: PORTABLE CHEST 1 VIEW COMPARISON:  Radiograph  07/07/2018 FINDINGS: Upper normal heart size with aortic atherosclerosis. Diffuse interstitial opacities suggesting pulmonary edema. More patchy airspace disease in the bilateral mid lung zones infrahilar regions. Possible small pleural effusions. No pneumothorax. IMPRESSION: Diffuse interstitial opacities suggesting pulmonary edema, with more patchy consolidation in the mid lung zones and infrahilar regions. Suspect pleural effusions. Findings may be due to fluid overload, infection, or combination thereof. Electronically Signed   By: Keith Rake M.D.   On: 06/02/2019 00:50    Anti-infectives: Anti-infectives (From admission, onward)   None       Assessment/Plan: History of stroke Chronic anticoagulation Hypothyroidism Hyperlipidemia Atherosclerosis of abdominal aorta (HCC) Elevated troponin level Dependent on walker for ambulation Fluid overload  SBO -resolving.  On soft diet and seems to be tolerating well. -passing flatus with no nausea. -cont soft diet.  No further plans for surgical intervention. -defer to medicine for further issues with fluid overload/SOB -we will sign off at this time  FEN - soft diet VTE - SLIV ID - Heparin   LOS: 3 days    Henreitta Cea , [facility] Surgery 06/02/2019, 8:18 AM Pager: [phone]

## 2019-06-03 ENCOUNTER — Inpatient Hospital Stay (HOSPITAL_COMMUNITY): Payer: Medicare Other

## 2019-06-03 DIAGNOSIS — E785 Hyperlipidemia, unspecified: Secondary | ICD-10-CM

## 2019-06-03 DIAGNOSIS — I5031 Acute diastolic (congestive) heart failure: Secondary | ICD-10-CM

## 2019-06-03 LAB — BASIC METABOLIC PANEL
Anion gap: 11 (ref 5–15)
BUN: 16 mg/dL (ref 8–23)
CO2: 30 mmol/L (ref 22–32)
Calcium: 8.2 mg/dL — ABNORMAL LOW (ref 8.9–10.3)
Chloride: 98 mmol/L (ref 98–111)
Creatinine, Ser: 0.93 mg/dL (ref 0.44–1.00)
GFR calc Af Amer: >60 mL/min (ref >60)
GFR calc non Af Amer: 53 mL/min — ABNORMAL LOW (ref >60)
Glucose, Bld: 121 mg/dL — ABNORMAL HIGH (ref 70–99)
Potassium: 3.5 mmol/L (ref 3.5–5.1)
Sodium: 139 mmol/L (ref 135–145)

## 2019-06-03 MED ORDER — HYDROCODONE-ACETAMINOPHEN 5-325 MG PO TABS: 1.0000 | ORAL_TABLET | Freq: Three times a day (TID) | ORAL | 0 refills | Status: DC | PRN

## 2019-06-03 MED ORDER — TECHNETIUM TO 99M ALBUMIN AGGREGATED
1.6000 | Freq: Once | INTRAVENOUS | Status: AC | PRN
  Administered 2019-06-03: 1.6 via INTRAVENOUS

## 2019-06-03 MED ORDER — POLYETHYLENE GLYCOL 3350 17 G PO PACK: 17.0000 g | PACK | Freq: Every day | ORAL | 0 refills | Status: AC

## 2019-06-03 MED ORDER — POLYETHYLENE GLYCOL 3350 17 G PO PACK: 17.0000 g | PACK | Freq: Every day | ORAL | Status: DC

## 2019-06-03 NOTE — TOC Transition Note (Signed)
Transition of Care [facility]) - CM/SW Discharge Note   Patient Details  Name: Raven Peterson MRN: [id number] Date of Birth: Mar 20, 1922  Transition of Care [facility]) CM/SW Contact:  Wende Neighbors, LCSW Phone Number: 06/03/2019, 11:14 AM   Clinical Narrative:   Patient to go back home to [facility] via [facility]. RN to please call [phone] for report. Pick up scheduled for 1:30 pm pick up     Final next level of care: Assisted Living Barriers to Discharge: No Barriers Identified   Patient Goals and CMS Choice        Discharge Placement                Patient to be transferred to facility by: ptar Name of family member notified: pt to call family Patient and family notified of of transfer: 06/03/19  Discharge Plan and Services In-house Referral: Clinical Social Work                                   Social Determinants of Health (SDOH) Interventions     Readmission Risk Interventions No flowsheet data found.

## 2019-06-03 NOTE — Plan of Care (Signed)
  Problem: Education: Goal: Knowledge of General Education information will improve Description: Including pain rating scale, medication(s)/side effects and non-pharmacologic comfort measures 06/03/2019 1251 by Zadie Rhine, RN Outcome: Adequate for Discharge 06/03/2019 1250 by Zadie Rhine, RN Outcome: Progressing   Problem: Health Behavior/Discharge Planning: Goal: Ability to manage health-related needs will improve 06/03/2019 1251 by Zadie Rhine, RN Outcome: Adequate for Discharge 06/03/2019 1250 by Zadie Rhine, RN Outcome: Progressing   Problem: Clinical Measurements: Goal: Ability to maintain clinical measurements within normal limits will improve 06/03/2019 1251 by Zadie Rhine, RN Outcome: Adequate for Discharge 06/03/2019 1250 by Zadie Rhine, RN Outcome: Progressing Goal: Will remain free from infection 06/03/2019 1251 by Zadie Rhine, RN Outcome: Adequate for Discharge 06/03/2019 1250 by Zadie Rhine, RN Outcome: Progressing Goal: Diagnostic test results will improve 06/03/2019 1251 by Zadie Rhine, RN Outcome: Adequate for Discharge 06/03/2019 1250 by Zadie Rhine, RN Outcome: Progressing Goal: Respiratory complications will improve 06/03/2019 1251 by Zadie Rhine, RN Outcome: Adequate for Discharge 06/03/2019 1250 by Zadie Rhine, RN Outcome: Progressing Goal: Cardiovascular complication will be avoided 06/03/2019 1251 by Zadie Rhine, RN Outcome: Adequate for Discharge 06/03/2019 1250 by Zadie Rhine, RN Outcome: Progressing   Problem: Activity: Goal: Risk for activity intolerance will decrease 06/03/2019 1251 by Zadie Rhine, RN Outcome: Adequate for Discharge 06/03/2019 1250 by Zadie Rhine, RN Outcome: Progressing   Problem: Nutrition: Goal: Adequate nutrition will be maintained 06/03/2019 1251 by Zadie Rhine, RN Outcome: Adequate for Discharge 06/03/2019 1250 by Zadie Rhine, RN Outcome: Progressing   Problem: Coping: Goal: Level of anxiety will decrease 06/03/2019 1251 by Zadie Rhine, RN Outcome: Adequate for Discharge 06/03/2019 1250 by Zadie Rhine, RN Outcome: Progressing   Problem: Elimination: Goal: Will not experience complications related to bowel motility 06/03/2019 1251 by Zadie Rhine, RN Outcome: Adequate for Discharge 06/03/2019 1250 by Zadie Rhine, RN Outcome: Progressing Goal: Will not experience complications related to urinary retention 06/03/2019 1251 by Zadie Rhine, RN Outcome: Adequate for Discharge 06/03/2019 1250 by Zadie Rhine, RN Outcome: Progressing   Problem: Safety: Goal: Ability to remain free from injury will improve 06/03/2019 1251 by Zadie Rhine, RN Outcome: Adequate for Discharge 06/03/2019 1250 by Zadie Rhine, RN Outcome: Progressing   Problem: Acute Rehab PT Goals(only PT should resolve) Goal: Pt Will Go Supine/Side To Sit Outcome: Adequate for Discharge Goal: Pt Will Go Sit To Supine/Side Outcome: Adequate for Discharge Goal: Patient Will Transfer Sit To/From Stand Outcome: Adequate for Discharge Goal: Pt Will Ambulate Outcome: Adequate for Discharge

## 2019-06-03 NOTE — Progress Notes (Signed)
Pt discharged with EMS--PTAR. Pt alert and oriented, anxious about returning to her ALF. SRP, RN

## 2019-06-03 NOTE — Progress Notes (Signed)
Physical Therapy Treatment Patient Details Name: Raven Peterson MRN: [id number] DOB: 1922/09/29 Today's Date: 06/03/2019    History of Present Illness 85 yo female admitted with SBO. Hx of CVA, Sz    PT Comments    Min guard for safety with bed mobility, STS and gait.  Pt able to complete mod I, increased time to complete.  Gait training complete with RW x 75 feet, slow mechanics no LOB. EOS pt returned to bed with NT in room to remove IV prior DC.  No reports of pain, was limited by fatigue.   Follow Up Recommendations  Home health PT     Equipment Recommendations  None recommended by PT    Recommendations for Other Services       Precautions / Restrictions Precautions Precautions: Fall Restrictions Weight Bearing Restrictions: No    Mobility  Bed Mobility Overal bed mobility: Modified Independent             General bed mobility comments: Min guard for safety, increased time  Transfers Overall transfer level: Needs assistance Equipment used: Rolling walker (2 wheeled) Transfers: Sit to/from Stand Sit to Stand: Min assist         General transfer comment: Assist from low surfaces. VCs safety, hand placement. Increased time.  Ambulation/Gait Ambulation/Gait assistance: Min guard Gait Distance (Feet): 75 Feet Assistive device: Rolling walker (2 wheeled) Gait Pattern/deviations: Step-through pattern;Decreased stride length     General Gait Details: close guard for safety. slow gait speed.  No LOB   Stairs             Wheelchair Mobility    Modified Rankin (Stroke Patients Only)       Balance                                            Cognition Arousal/Alertness: Awake/alert Behavior During Therapy: WFL for tasks assessed/performed Overall Cognitive Status: Within Functional Limits for tasks assessed                                        Exercises      General Comments        Pertinent  Vitals/Pain Pain Assessment: No/denies pain    Home Living                      Prior Function            PT Goals (current goals can now be found in the care plan section)      Frequency    Min 3X/week      PT Plan      Co-evaluation              AM-PAC PT "6 Clicks" Mobility   Outcome Measure  Help needed turning from your back to your side while in a flat bed without using bedrails?: A Little Help needed moving from lying on your back to sitting on the side of a flat bed without using bedrails?: A Little Help needed moving to and from a bed to a chair (including a wheelchair)?: A Little Help needed standing up from a chair using your arms (e.g., wheelchair or bedside chair)?: A Little Help needed to walk in hospital room?: A Little Help needed climbing 3-5 steps  with a railing? : A Lot 6 Click Score: 17    End of Session Equipment Utilized During Treatment: Gait belt Activity Tolerance: Patient tolerated treatment well Patient left: in bed;with bed alarm set;with call bell/phone within reach;with nursing/sitter in room Nurse Communication: Mobility status       Time: 1135-1150 PT Time Calculation (min) (ACUTE ONLY): 15 min  Charges:  $Gait Training: 8-22 mins                     [address]Casey Cockerham, LPTA; CBIS [phone]   Juel BurrowCockerham, Casey Jo 06/03/2019, 12:57 PM

## 2019-06-03 NOTE — Progress Notes (Deleted)
Progress Note  Patient Name: Raven Peterson Date of Encounter: 06/03/2019  Primary Cardiologist: Minus Breeding, MD - New  Subjective   Pt is pleasant and cheerful. No further shortness of breath since the night before last. No chest pain.   Inpatient Medications    Scheduled Meds:  aspirin EC  81 mg Oral Daily   atorvastatin  40 mg Oral Daily   benzonatate  100 mg Oral Q8H   bisacodyl  10 mg Rectal Daily   heparin  5,000 Units Subcutaneous Q8H   lacosamide  100 mg Oral BID   latanoprost  1 drop Both Eyes QHS   levothyroxine  25 mcg Oral QAC breakfast   lip balm  1 application Topical BID   lisinopril  5 mg Oral Daily   mouth rinse  15 mL Mouth Rinse BID   montelukast  10 mg Oral QHS   pantoprazole  40 mg Oral Daily   polyethylene glycol  17 g Oral Daily   polyvinyl alcohol  1 drop Both Eyes Daily   Continuous Infusions:  chlorproMAZINE (THORAZINE) IV     ondansetron (ZOFRAN) IV     PRN Meds: acetaminophen, alum & mag hydroxide-simeth, chlorproMAZINE (THORAZINE) IV, guaiFENesin-dextromethorphan, hydrocortisone, hydrocortisone cream, labetalol, magic mouthwash, menthol-cetylpyridinium, methocarbamol, morphine injection, ondansetron (ZOFRAN) IV **OR** ondansetron (ZOFRAN) IV, phenol, prochlorperazine, promethazine   Vital Signs    Vitals:   06/02/19 1334 06/02/19 2123 06/03/19 0545 06/03/19 1049  BP: (!) 155/73 (!) 166/85 (!) 153/68 (!) 139/56  Pulse: 100 86 81 91  Resp: 18 18 18 19   Temp: 98.1 F (36.7 C) 98 F (36.7 C) 98.2 F (36.8 C) 98.4 F (36.9 C)  TempSrc: Oral Oral Oral Oral  SpO2: 93% 97% 94% 95%  Weight:      Height:        Intake/Output Summary (Last 24 hours) at 06/03/2019 1133 Last data filed at 06/03/2019 0545 Gross per 24 hour  Intake 360 ml  Output 2350 ml  Net -1990 ml   Last 3 Weights 05/31/2019  Weight (lbs) 151 lb 3.8 oz  Weight (kg) 68.6 kg      Telemetry    Non-tele - Personally Reviewed  ECG    No new tracings  - Personally Reviewed  Physical Exam   GEN: No acute distress.   Neck: No JVD Cardiac: RRR, 2-6 systolic murmur Respiratory: Clear to auscultation bilaterally. GI: Soft, nontender, non-distended  MS: No edema; No deformity. Neuro:  Nonfocal  Psych: Normal affect   Labs    High Sensitivity Troponin:  No results for input(s): TROPONINIHS in the last 720 hours.    Cardiac Enzymes Recent Labs  Lab 05/31/19 0331 05/31/19 0938 06/02/19 0035 06/02/19 0649  TROPONINI 0.32* 0.39* 0.20* 0.40*   0.42*   No results for input(s): TROPIPOC in the last 168 hours.   Chemistry Recent Labs  Lab 05/30/19 1325 06/01/19 0417 06/02/19 0035 06/03/19 0522  NA 138 141 139 139  K 3.8 3.6 3.9 3.5  CL 104 108 108 98  CO2 24 25 21* 30  GLUCOSE 103* 107* 228* 121*  BUN 16 10 12 16   CREATININE 0.81 0.76 1.09* 0.93  CALCIUM 9.2 8.2* 8.1* 8.2*  PROT 7.8  --  6.2*  --   ALBUMIN 4.3  --  3.0*  --   AST 18  --  15  --   ALT 15  --  10  --   ALKPHOS 91  --  79  --  BILITOT 0.9  --  0.9  --   GFRNONAA >60 >60 43* 53*  GFRAA >60 >60 50* >60  ANIONGAP 10 8 10 11      Hematology Recent Labs  Lab 06/01/19 0417 06/02/19 0035 06/02/19 0649  WBC 6.8 14.6* 8.8  RBC 4.23 4.73 4.60  HGB 13.3 15.3* 14.6  HCT 42.3 47.5* 46.7*  MCV 100.0 100.4* 101.5*  MCH 31.4 32.3 31.7  MCHC 31.4 32.2 31.3  RDW 13.1 12.7 12.7  PLT 211 295 219    BNPNo results for input(s): BNP, PROBNP in the last 168 hours.   DDimer  Recent Labs  Lab 06/02/19 0035  DDIMER 1.44*     Radiology    Nm Pulmonary Perfusion  Result Date: 06/03/2019 CLINICAL DATA:  85 year old female with tachycardia, shortness of breath. EXAM: NUCLEAR MEDICINE PERFUSION LUNG SCAN TECHNIQUE: Perfusion images were obtained in multiple projections after intravenous injection of radiopharmaceutical. Ventilation scans intentionally deferred if perfusion scan and chest x-ray adequate for interpretation during COVID 19 epidemic.  RADIOPHARMACEUTICALS:  1.6 mCi Tc-3027m MAA IV COMPARISON:  Portable chest 06/02/2019 and earlier. FINDINGS: In homogeneous perfusion radiotracer activity throughout both lungs, but there is no discrete wedge-shaped or segmental perfusion defect to suggest acute pulmonary embolus. IMPRESSION: Heterogeneous perfusion but no discrete perfusion defect to suggest acute pulmonary embolus. Electronically Signed   By: Odessa FlemingH  Hall M.D.   On: 06/03/2019 09:51   Dg Chest Port 1 View  Result Date: 06/02/2019 CLINICAL DATA:  Dyspnea. EXAM: PORTABLE CHEST 1 VIEW COMPARISON:  Radiograph 07/07/2018 FINDINGS: Upper normal heart size with aortic atherosclerosis. Diffuse interstitial opacities suggesting pulmonary edema. More patchy airspace disease in the bilateral mid lung zones infrahilar regions. Possible small pleural effusions. No pneumothorax. IMPRESSION: Diffuse interstitial opacities suggesting pulmonary edema, with more patchy consolidation in the mid lung zones and infrahilar regions. Suspect pleural effusions. Findings may be due to fluid overload, infection, or combination thereof. Electronically Signed   By: Narda RutherfordMelanie  Sanford M.D.   On: 06/02/2019 00:50    Cardiac Studies   Echocardiogram 05/31/2019: IMPRESSIONS   1. The left ventricle has hyperdynamic systolic function, with an ejection fraction of >65%. The cavity size was normal. There is mild asymmetric left ventricular hypertrophy. Left ventricular diastolic Doppler parameters are consistent with impaired  relaxation. 2. Mildly accelerated blood flow, mid cavitary to distal LVOT consistent with hyperdynamic function (2.3052m/s). 3. The right ventricle has normal systolic function. The cavity was normal. There is no increase in right ventricular wall thickness. 4. The mitral valve is degenerative. Mild thickening of the mitral valve leaflet. Mild calcification of the mitral valve leaflet. 5. The aortic valve is tricuspid. Moderate thickening of the  aortic valve. Moderate calcification of the aortic valve. No stenosis of the aortic valve.   Patient Profile     85 y.o. female with PMH of HTN, CVA, seizure disorder, and hypothyroidism, who presented with abdominal pain and was found to have an SBO. Cardiology following for elevated troponins.   Assessment & Plan    1. Elevated troponins: troponin Trend relatively flat and not consistent with ACS. No significant EKG changes. Felt to be demand ischemia. Echo with EF >65% and without RWMA.  - No further inpatient ischemic evaluation. Can consider outpatient testing after resolution of present illness  2. Dyspnea: 2 night ago patient reported acute SOB overnight requiring NRB. CXR with concerns for pulmonary edema. Ddimer elevated and VQ Scan not consistent with acute pulmonary embolus.  -Diuresed with 2 doses of IV  lasix 40 mg. 2.7L UOP yesterday. Wt not documented. Symptoms improved. Does not look volume overloaded today.   3. SBO: slowly resolving with conservative management - Continue management per primary team/surgery  4. HTN: BP overall stable, occ elevation - Continue lisinopril  5. CVA:  - Resume apixaban 2.5mg  BID when it is clear no further procedures will be needed  6. HLD: - Continue  statin  7. Cardiac murmur -Probably some dynamic outflow obstruction. No significant valvular abnormalities.   For questions or updates, please contact [facility] Please consult [email or web address] for contact info under        Signed, Berton BonJanine Jinelle Butchko, NP  06/03/2019, 11:33 AM

## 2019-06-03 NOTE — Progress Notes (Signed)
Pt discharged to [facility], report called to [phone], LPN. Pt prepared in room and awaits for EMS for transport. SRP, RN

## 2019-06-03 NOTE — Discharge Summary (Signed)
Physician Discharge Summary  Ethlyn Danielslse Bovenzi WUJ:[id number] DOB: 12/13/1921 DOA: 05/30/2019  PCP: Patient, No Pcp Per  Admit date: 05/30/2019 Discharge date: 06/03/2019  Admitted From: SNF  Disposition:  SNF  Recommendations for Outpatient Follow-up and new medication changes:  1. Follow up with Primary care in 7 days.    Home Health: no   Equipment/Devices: no    Discharge Condition: stable  CODE STATUS: dnr   Diet recommendation: heart healthy   Brief/Interim Summary: 85 year old female who presented abdominal pain and vomiting. She does have significant past medical history for CVA, seizures, hypertension and hypothyroidism. Reported acute epigastric abdominal pain associate with nausea but no vomiting, no bowel movement or flatusover the last 48 hours prior to hospitalixzation.On her initial physical examination blood pressure 181/95, pulse rate 100, respiratory rate 20, oxygen saturation 92%, her lungs were clear to auscultation bilaterally, heart S1-S2 present and rhythmic, abdomen was distended, diffusely tender to palpation, no rebound or guarding, decreased bowel sounds, no lower extremity edema. Sodium 138, potassium 3.8, chloride 104, bicarb 24, glucose 103, BUN 16, creatinine 0.81, white count 10.8, hemoglobin 16.3, hematocrit 49.2, platelets 255,urinalysis 6-10 white cells. Troponin 0.08, 0.10, 0.11, 0.32, 0.39 CT of the abdomen with mildly distended small bowel loops within the central abdomen and left lower quadrant, compatible with partial obstruction versus ileus. No obstruction transition zone identified. EKG 106 bpm, normal axis, normal intervals, sinus rhythm, Q waves V1 through V4, no significant ST segment or T wave changes.  Patient was admitted to the hospital working diagnosis of small bowel obstruction.  1.  Partial small bowel obstruction/ileus.  Patient was admitted to the medical ward, she was medically treated with intravenous fluids, she was kept nothing by  mouth and a small bore NG tube was inserted for decompression.  She responded well to medical therapy, she recover her bowel function, her diet was advanced and nasogastric tube was successfully removed.   2.  Acute hypoxic respiratory failure due to volume overload related acute pulmonary edema.  Patient had acute episode of desaturation, she was briefly placed on noninvasive mechanical ventilation and transferred to the stepdown unit.  She was treated with IV furosemide, negative fluid balance was achieved with significant improvement of her symptoms.  She had further work-up with pulmonary VQ scan which was low suspicion for pulmonary embolism.  Her oxygen saturation at discharge is 93% on room air.  3.  Elevated troponin, demand ischemia, ruled out for acute coronary syndrome.  Her cardiac troponins remain elevated in a non-ischemic pattern, her EKG had no significant changes, further work-up with echocardiography showed a preserved LV systolic function with no wall motion normalities.  4.  History of CVA.  Continue atorvastatin.  Old records personally reviewed, neurology follow-up [facility] November 2018, follow-up for strokes in multiple vascular territories presumed to be cardioembolic, treated with low-dose apixaban 2.5 g twice daily.  5.  Seizures.  Patient was continued on lacosamide with no complications.  No seizure activity.  6.  Hypertension.  Blood pressure remained well controlled. At discharge patient will resume lisinopril 5 mg daily.   7.  Hypothyroidism.  Continue levothyroxine.    Discharge Diagnoses:  Principal Problem:   Dyspnea Active Problems:   History of stroke   Chronic anticoagulation   Hypothyroidism   Hyperlipidemia   Atherosclerosis of abdominal aorta (HCC)   Ileus (HCC)   SBO (small bowel obstruction) (HCC)   Elevated troponin level   Dependent on walker for ambulation   Acute diastolic HF (  heart failure) [facility])    Discharge  Instructions   Allergies as of 06/03/2019      Reactions   Cimetidine Other (See Comments)   Palpitations.   Naproxen Sodium Other (See Comments)   Affects glaucoma medication   Fluzone [flu Virus Vaccine]    Unknown- listed on MAR   Fosamax [alendronate Sodium]    Unknown- listed on MAR   Loratadine    Unknown- listed on MAR   Iodinated Diagnostic Agents Nausea Only      Medication List    TAKE these medications   acetaminophen 500 MG tablet Commonly known as: TYLENOL Take 500 mg by mouth 2 (two) times daily as needed for mild pain.   apixaban 2.5 MG Tabs tablet Commonly known as: ELIQUIS Take 2.5 mg by mouth 2 (two) times daily. Reported on 04/03/2016   atorvastatin 40 MG tablet Commonly known as: LIPITOR Take 40 mg by mouth daily.   benzonatate 100 MG capsule Commonly known as: TESSALON Take 1 capsule (100 mg total) by mouth every 8 (eight) hours.   carboxymethylcellulose 0.5 % Soln Commonly known as: REFRESH PLUS Place 1 drop into both eyes every morning.   HYDROcodone-acetaminophen 5-325 MG tablet Commonly known as: NORCO/VICODIN Take 1 tablet by mouth 3 (three) times daily as needed for moderate pain.   latanoprost 0.005 % ophthalmic solution Commonly known as: XALATAN Place 1 drop into both eyes at bedtime.   levothyroxine 25 MCG tablet Commonly known as: SYNTHROID Take 25 mcg by mouth daily before breakfast.   lisinopril 5 MG tablet Commonly known as: ZESTRIL Take 5 mg by mouth daily.   methocarbamol 500 MG tablet Commonly known as: ROBAXIN Take 500 mg by mouth 2 (two) times daily as needed for muscle spasms.   montelukast 10 MG tablet Commonly known as: SINGULAIR Take by mouth at bedtime.   omeprazole 20 MG capsule Commonly known as: PRILOSEC Take 20 mg by mouth daily.   polyethylene glycol 17 g packet Commonly known as: MIRALAX / GLYCOLAX Take 17 g by mouth daily.   Vimpat 100 MG Tabs Generic drug: Lacosamide Take 100 mg by mouth 2  (two) times daily.       Allergies  Allergen Reactions  . Cimetidine Other (See Comments)    Palpitations.   . Naproxen Sodium Other (See Comments)    Affects glaucoma medication  . Fluzone [Flu Virus Vaccine]     Unknown- listed on MAR  . Fosamax [Alendronate Sodium]     Unknown- listed on MAR  . Loratadine     Unknown- listed on MAR   . Iodinated Diagnostic Agents Nausea Only    Consultations:  Surgery   Cardiology    Procedures/Studies: Ct Abdomen Pelvis Wo Contrast  Result Date: 05/30/2019 CLINICAL DATA:  Upper abdominal pain, nausea and vomiting since yesterday. EXAM: CT ABDOMEN AND PELVIS WITHOUT CONTRAST TECHNIQUE: Multidetector CT imaging of the abdomen and pelvis was performed following the standard protocol without IV contrast. COMPARISON:  None. FINDINGS: Lower chest: Mild scarring/atelectasis at the lung bases. Hepatobiliary: Status post cholecystectomy. Associated pneumobilia. No focal liver abnormality is seen. Pancreas: Unremarkable. No pancreatic ductal dilatation or surrounding inflammatory changes. Spleen: Normal in size without focal abnormality. Adrenals/Urinary Tract: Adrenal glands appear normal. Kidneys are unremarkable without mass, stone or hydronephrosis. No ureteral or bladder calculi. Bladder appears normal, partially decompressed. Stomach/Bowel: Stool and oral contrast is present within the normal caliber colon. Mildly distended small bowel loops within the central abdomen to LEFT lower quadrant. No  obstructing transition zone identified. No evidence of associated bowel wall inflammation. Stomach is unremarkable. Appendix is normal. Vascular/Lymphatic: Extensive aortic atherosclerosis. No enlarged lymph nodes seen. Reproductive: Uterus and bilateral adnexa are unremarkable. Other: No free fluid or abscess collection. No free intraperitoneal air. Musculoskeletal: No acute or suspicious osseous finding. Degenerative spondylosis of the lumbar spine, mild to  moderate in degree. Chronic appearing compression deformities of the L3 and L4 vertebral bodies. IMPRESSION: 1. Mildly distended small bowel loops within the central abdomen and left lower quadrant, compatible with partial obstruction versus ileus. No obstructing transition zone identified. No obstructing mass or bowel wall inflammation. Stool and oral contrast within the normal caliber colon. 2. Additional chronic/incidental findings detailed above. Aortic Atherosclerosis (ICD10-I70.0). Electronically Signed   By: Bary Richard M.D.   On: 05/30/2019 16:33   Nm Pulmonary Perfusion  Result Date: 06/03/2019 CLINICAL DATA:  85 year old female with tachycardia, shortness of breath. EXAM: NUCLEAR MEDICINE PERFUSION LUNG SCAN TECHNIQUE: Perfusion images were obtained in multiple projections after intravenous injection of radiopharmaceutical. Ventilation scans intentionally deferred if perfusion scan and chest x-ray adequate for interpretation during COVID 19 epidemic. RADIOPHARMACEUTICALS:  1.6 mCi Tc-86m MAA IV COMPARISON:  Portable chest 06/02/2019 and earlier. FINDINGS: In homogeneous perfusion radiotracer activity throughout both lungs, but there is no discrete wedge-shaped or segmental perfusion defect to suggest acute pulmonary embolus. IMPRESSION: Heterogeneous perfusion but no discrete perfusion defect to suggest acute pulmonary embolus. Electronically Signed   By: Odessa Fleming M.D.   On: 06/03/2019 09:51   Dg Chest Port 1 View  Result Date: 06/02/2019 CLINICAL DATA:  Dyspnea. EXAM: PORTABLE CHEST 1 VIEW COMPARISON:  Radiograph 07/07/2018 FINDINGS: Upper normal heart size with aortic atherosclerosis. Diffuse interstitial opacities suggesting pulmonary edema. More patchy airspace disease in the bilateral mid lung zones infrahilar regions. Possible small pleural effusions. No pneumothorax. IMPRESSION: Diffuse interstitial opacities suggesting pulmonary edema, with more patchy consolidation in the mid lung zones  and infrahilar regions. Suspect pleural effusions. Findings may be due to fluid overload, infection, or combination thereof. Electronically Signed   By: Narda Rutherford M.D.   On: 06/02/2019 00:50   Dg Abd Portable 1v-small Bowel Obstruction Protocol-initial, 8 Hr Delay  Result Date: 05/31/2019 CLINICAL DATA:  Small bowel obstruction, 8 hour delayed film EXAM: PORTABLE ABDOMEN - 1 VIEW COMPARISON:  05/30/2019 abdominal radiograph FINDINGS: Enteric tube terminates in the distal stomach. Cholecystectomy clips are seen in the right upper quadrant of the abdomen. Oral contrast transits to the rectum. No disproportionately dilated small bowel loops. No evidence of pneumatosis or pneumoperitoneum. IMPRESSION: 1. Enteric tube terminates in the distal stomach. 2. No disproportionately dilated small bowel loops. Oral contrast transits to the rectum. Findings are compatible with resolved small bowel obstruction. Electronically Signed   By: Delbert Phenix M.D.   On: 05/31/2019 07:59   Dg Abd Portable 1v-small Bowel Protocol-position Verification  Result Date: 05/30/2019 CLINICAL DATA:  Status post NG tube placement today. EXAM: PORTABLE ABDOMEN - 1 VIEW COMPARISON:  None. FINDINGS: NG tube is in place with the tip in the stomach. Side port is near the gastroesophageal junction. The tube could be advanced 2 cm for better positioning. IMPRESSION: As above. Electronically Signed   By: Drusilla Kanner M.D.   On: 05/30/2019 19:47      Procedures:   Subjective: Patient is feeling better, no nausea or vomiting, no chest pain or dyspnea. Feeling back to her baseline.   Discharge Exam: Vitals:   06/02/19 2123 06/03/19 0545  BP: Marland Kitchen)  166/85 (!) 153/68  Pulse: 86 81  Resp: 18 18  Temp: 98 F (36.7 C) 98.2 F (36.8 C)  SpO2: 97% 94%   Vitals:   06/02/19 1059 06/02/19 1334 06/02/19 2123 06/03/19 0545  BP: (!) 149/78 (!) 155/73 (!) 166/85 (!) 153/68  Pulse: 88 100 86 81  Resp: 18 18 18 18   Temp: 97.8 F  (36.6 C) 98.1 F (36.7 C) 98 F (36.7 C) 98.2 F (36.8 C)  TempSrc: Oral Oral Oral Oral  SpO2: 98% 93% 97% 94%  Weight:      Height:        General: Not in pain or dyspnea  Neurology: Awake and alert, non focal  E ENT: mild pallor, no icterus, oral mucosa moist Cardiovascular: No JVD. S1-S2 present, rhythmic, no gallops, rubs, or murmurs. No lower extremity edema. Pulmonary: positive breath sounds bilaterally, adequate air movement, no wheezing, rhonchi or rales. Gastrointestinal. Abdomen with, no organomegaly, non tender, no rebound or guarding Skin. No rashes Musculoskeletal: no joint deformities   The results of significant diagnostics from this hospitalization (including imaging, microbiology, ancillary and laboratory) are listed below for reference.     Microbiology: Recent Results (from the past 240 hour(s))  Urine culture     Status: Abnormal   Collection Time: 05/30/19  1:38 PM   Specimen: Urine, Random  Result Value Ref Range Status   Specimen Description   Final    URINE, RANDOM Performed at [facility], [address] [place], [place] [address]    Special Requests   Final    NONE Performed at [facility], [address] [place], [place] [address]    Culture (A)  Final    <10,000 COLONIES/mL INSIGNIFICANT GROWTH Performed at [facility], [address] [place], [place] [address]    Report Status 05/31/2019 FINAL  Final  SARS Coronavirus 2 (CEPHEID - Performed in [facility] hospital lab), Hosp Order     Status: None   Collection Time: 05/30/19  4:36 PM   Specimen: Nasopharyngeal Swab  Result Value Ref Range Status   SARS Coronavirus 2 NEGATIVE NEGATIVE Final    Comment: (NOTE) If result is NEGATIVE SARS-CoV-2 target nucleic acids are NOT DETECTED. The SARS-CoV-2 RNA is generally detectable in upper and lower  respiratory specimens during the acute phase of infection. The lowest  concentration of  SARS-CoV-2 viral copies this assay can detect is 250  copies / mL. A negative result does not preclude SARS-CoV-2 infection  and should not be used as the sole basis for treatment or other  patient management decisions.  A negative result may occur with  improper specimen collection / handling, submission of specimen other  than nasopharyngeal swab, presence of viral mutation(s) within the  areas targeted by this assay, and inadequate number of viral copies  (<250 copies / mL). A negative result must be combined with clinical  observations, patient history, and epidemiological information. If result is POSITIVE SARS-CoV-2 target nucleic acids are DETECTED. The SARS-CoV-2 RNA is generally detectable in upper and lower  respiratory specimens dur ing the acute phase of infection.  Positive  results are indicative of active infection with SARS-CoV-2.  Clinical  correlation with patient history and other diagnostic information is  necessary to determine patient infection status.  Positive results do  not rule out bacterial infection or co-infection with other viruses. If result is PRESUMPTIVE POSTIVE SARS-CoV-2 nucleic acids MAY BE PRESENT.   A presumptive positive result  was obtained on the submitted specimen  and confirmed on repeat testing.  While 2019 novel coronavirus  (SARS-CoV-2) nucleic acids may be present in the submitted sample  additional confirmatory testing may be necessary for epidemiological  and / or clinical management purposes  to differentiate between  SARS-CoV-2 and other Sarbecovirus currently known to infect humans.  If clinically indicated additional testing with an alternate test  methodology [phone]([id number]) is advised. The SARS-CoV-2 RNA is generally  detectable in upper and lower respiratory sp ecimens during the acute  phase of infection. The expected result is Negative. Fact Sheet for Patients:  BoilerBrush.com.cyhttps://www.fda.gov/media/136312/download Fact Sheet for Healthcare  Providers: https://pope.com/https://www.fda.gov/media/136313/download This test is not yet approved or cleared by the [place] FDA and has been authorized for detection and/or diagnosis of SARS-CoV-2 by FDA under an Emergency Use Authorization (EUA).  This EUA will remain in effect (meaning this test can be used) for the duration of the COVID-19 declaration under Section 564(b)(1) of the Act, 21 U.S.C. section 360bbb-3(b)(1), unless the authorization is terminated or revoked sooner. Performed at [facility], [address] [place], [place] [address]   MRSA PCR Screening     Status: None   Collection Time: 06/02/19 12:28 AM   Specimen: Nasal Mucosa; Nasopharyngeal  Result Value Ref Range Status   MRSA by PCR NEGATIVE NEGATIVE Final    Comment:        The GeneXpert MRSA Assay (FDA approved for NASAL specimens only), is one component of a comprehensive MRSA colonization surveillance program. It is not intended to diagnose MRSA infection nor to guide or monitor treatment for MRSA infections. Performed at [facility], [address] [place], [place] [address]      Labs: BNP (last 3 results) No results for input(s): BNP in the last 8760 hours. Basic Metabolic Panel: Recent Labs  Lab 05/30/19 1325 06/01/19 0417 06/02/19 0035 06/03/19 0522  NA 138 141 139 139  K 3.8 3.6 3.9 3.5  CL 104 108 108 98  CO2 24 25 21* 30  GLUCOSE 103* 107* 228* 121*  BUN 16 10 12 16   CREATININE 0.81 0.76 1.09* 0.93  CALCIUM 9.2 8.2* 8.1* 8.2*   Liver Function Tests: Recent Labs  Lab 05/30/19 1325 06/02/19 0035  AST 18 15  ALT 15 10  ALKPHOS 91 79  BILITOT 0.9 0.9  PROT 7.8 6.2*  ALBUMIN 4.3 3.0*   Recent Labs  Lab 05/30/19 1325  LIPASE 40   No results for input(s): AMMONIA in the last 168 hours. CBC: Recent Labs  Lab 05/30/19 1325 06/01/19 0417 06/02/19 0035 06/02/19 0649  WBC 10.8* 6.8 14.6* 8.8  NEUTROABS 8.3* 4.8  --  7.3  HGB 16.3* 13.3  15.3* 14.6  HCT 49.2* 42.3 47.5* 46.7*  MCV 96.5 100.0 100.4* 101.5*  PLT 255 211 295 219   Cardiac Enzymes: Recent Labs  Lab 05/30/19 2230 05/31/19 0331 05/31/19 0938 06/02/19 0035 06/02/19 0649  TROPONINI 0.11* 0.32* 0.39* 0.20* 0.40*  0.42*   BNP: Invalid input(s): POCBNP CBG: Recent Labs  Lab 05/31/19 0755 05/31/19 1142  GLUCAP 109* 78   D-Dimer Recent Labs    06/02/19 0035  DDIMER 1.44*   Hgb A1c No results for input(s): HGBA1C in the last 72 hours. Lipid Profile No results for input(s): CHOL, HDL, LDLCALC, TRIG, CHOLHDL, LDLDIRECT in the last 72 hours. Thyroid function studies No results for input(s): TSH, T4TOTAL, T3FREE, THYROIDAB in the last 72 hours.  Invalid input(s): FREET3 Anemia work  up No results for input(s): VITAMINB12, FOLATE, FERRITIN, TIBC, IRON, RETICCTPCT in the last 72 hours. Urinalysis    Component Value Date/Time   COLORURINE STRAW (A) 05/30/2019 1338   APPEARANCEUR CLEAR 05/30/2019 1338   LABSPEC 1.008 05/30/2019 1338   PHURINE 5.0 05/30/2019 1338   GLUCOSEU NEGATIVE 05/30/2019 1338   HGBUR SMALL (A) 05/30/2019 1338   BILIRUBINUR NEGATIVE 05/30/2019 1338   KETONESUR NEGATIVE 05/30/2019 1338   PROTEINUR NEGATIVE 05/30/2019 1338   NITRITE NEGATIVE 05/30/2019 1338   LEUKOCYTESUR SMALL (A) 05/30/2019 1338   Sepsis Labs Invalid input(s): PROCALCITONIN,  WBC,  LACTICIDVEN Microbiology Recent Results (from the past 240 hour(s))  Urine culture     Status: Abnormal   Collection Time: 05/30/19  1:38 PM   Specimen: Urine, Random  Result Value Ref Range Status   Specimen Description   Final    URINE, RANDOM Performed at [facility], [address], [place], [place] [address]    Special Requests   Final    NONE Performed at [facility], [address] [place], [place] [address]    Culture (A)  Final    <10,000 COLONIES/mL INSIGNIFICANT GROWTH Performed at [facility], [address], [place], [place] [address]    Report Status 05/31/2019 FINAL  Final  SARS Coronavirus 2 (CEPHEID - Performed in [facility] hospital lab), Hosp Order     Status: None   Collection Time: 05/30/19  4:36 PM   Specimen: Nasopharyngeal Swab  Result Value Ref Range Status   SARS Coronavirus 2 NEGATIVE NEGATIVE Final    Comment: (NOTE) If result is NEGATIVE SARS-CoV-2 target nucleic acids are NOT DETECTED. The SARS-CoV-2 RNA is generally detectable in upper and lower  respiratory specimens during the acute phase of infection. The lowest  concentration of SARS-CoV-2 viral copies this assay can detect is 250  copies / mL. A negative result does not preclude SARS-CoV-2 infection  and should not be used as the sole basis for treatment or other  patient management decisions.  A negative result may occur with  improper specimen collection / handling, submission of specimen other  than nasopharyngeal swab, presence of viral mutation(s) within the  areas targeted by this assay, and inadequate number of viral copies  (<250 copies / mL). A negative result must be combined with clinical  observations, patient history, and epidemiological information. If result is POSITIVE SARS-CoV-2 target nucleic acids are DETECTED. The SARS-CoV-2 RNA is generally detectable in upper and lower  respiratory specimens dur ing the acute phase of infection.  Positive  results are indicative of active infection with SARS-CoV-2.  Clinical  correlation with patient history and other diagnostic information is  necessary to determine patient infection status.  Positive results do  not rule out bacterial infection or co-infection with other viruses. If result is PRESUMPTIVE POSTIVE SARS-CoV-2 nucleic acids MAY BE PRESENT.   A presumptive positive result was obtained on the submitted specimen  and confirmed on repeat testing.  While 2019 novel coronavirus  (SARS-CoV-2) nucleic acids may be present in the submitted sample   additional confirmatory testing may be necessary for epidemiological  and / or clinical management purposes  to differentiate between  SARS-CoV-2 and other Sarbecovirus currently known to infect humans.  If clinically indicated additional testing with an alternate test  methodology [phone] is advised. The SARS-CoV-2 RNA is generally  detectable in upper and lower respiratory sp ecimens during the acute  phase of infection. The expected  result is Negative. Fact Sheet for Patients:  StrictlyIdeas.no Fact Sheet for Healthcare Providers: BankingDealers.co.za This test is not yet approved or cleared by the [place] FDA and has been authorized for detection and/or diagnosis of SARS-CoV-2 by FDA under an Emergency Use Authorization (EUA).  This EUA will remain in effect (meaning this test can be used) for the duration of the COVID-19 declaration under Section 564(b)(1) of the Act, 21 U.S.C. section 360bbb-3(b)(1), unless the authorization is terminated or revoked sooner. Performed at [facility], [place] [address], [place], [place] [address]   MRSA PCR Screening     Status: None   Collection Time: 06/02/19 12:28 AM   Specimen: Nasal Mucosa; Nasopharyngeal  Result Value Ref Range Status   MRSA by PCR NEGATIVE NEGATIVE Final    Comment:        The GeneXpert MRSA Assay (FDA approved for NASAL specimens only), is one component of a comprehensive MRSA colonization surveillance program. It is not intended to diagnose MRSA infection nor to guide or monitor treatment for MRSA infections. Performed at [facility], [place] [address], [place],  [address]      Time coordinating discharge: 45 minutes  SIGNED:   Tawni Millers, MD  [facility] 06/03/2019, 10:19 AM

## 2019-06-03 NOTE — Plan of Care (Signed)

## 2020-10-19 ENCOUNTER — Emergency Department (HOSPITAL_COMMUNITY)
Admission: EM | Admit: 2020-10-19 | Discharge: 2020-10-19 | Disposition: A | Discharge status: Home / Self Care | Payer: Medicare Other | Attending: Emergency Medicine | Admitting: Emergency Medicine

## 2020-10-19 ENCOUNTER — Other Ambulatory Visit: Payer: Self-pay

## 2020-10-19 ENCOUNTER — Encounter (HOSPITAL_COMMUNITY): Payer: Self-pay

## 2020-10-19 DIAGNOSIS — I5031 Acute diastolic (congestive) heart failure: Secondary | ICD-10-CM | POA: Insufficient documentation

## 2020-10-19 DIAGNOSIS — E039 Hypothyroidism, unspecified: Secondary | ICD-10-CM | POA: Insufficient documentation

## 2020-10-19 DIAGNOSIS — Z79899 Other long term (current) drug therapy: Secondary | ICD-10-CM | POA: Insufficient documentation

## 2020-10-19 DIAGNOSIS — I110 Hypertensive heart disease with heart failure: Secondary | ICD-10-CM | POA: Insufficient documentation

## 2020-10-19 DIAGNOSIS — Z7901 Long term (current) use of anticoagulants: Secondary | ICD-10-CM | POA: Diagnosis not present

## 2020-10-19 DIAGNOSIS — N3000 Acute cystitis without hematuria: Secondary | ICD-10-CM

## 2020-10-19 DIAGNOSIS — R300 Dysuria: Secondary | ICD-10-CM | POA: Diagnosis present

## 2020-10-19 LAB — COMPREHENSIVE METABOLIC PANEL
ALT: 15 U/L (ref 0–44)
AST: 19 U/L (ref 15–41)
Albumin: 3.9 g/dL (ref 3.5–5.0)
Alkaline Phosphatase: 58 U/L (ref 38–126)
Anion gap: 10 (ref 5–15)
BUN: 15 mg/dL (ref 8–23)
CO2: 21 mmol/L — ABNORMAL LOW (ref 22–32)
Calcium: 8.9 mg/dL (ref 8.9–10.3)
Chloride: 106 mmol/L (ref 98–111)
Creatinine, Ser: 0.81 mg/dL (ref 0.44–1.00)
GFR, Estimated: >60 mL/min (ref >60)
Glucose, Bld: 107 mg/dL — ABNORMAL HIGH (ref 70–99)
Potassium: 4.2 mmol/L (ref 3.5–5.1)
Sodium: 137 mmol/L (ref 135–145)
Total Bilirubin: 0.4 mg/dL (ref 0.3–1.2)
Total Protein: 6.8 g/dL (ref 6.5–8.1)

## 2020-10-19 LAB — URINALYSIS, ROUTINE W REFLEX MICROSCOPIC
Bilirubin Urine: NEGATIVE
Glucose, UA: NEGATIVE mg/dL
Hgb urine dipstick: NEGATIVE
Ketones, ur: NEGATIVE mg/dL
Nitrite: POSITIVE — AB
Protein, ur: NEGATIVE mg/dL
Specific Gravity, Urine: 1.005 (ref 1.005–1.030)
pH: 5 (ref 5.0–8.0)

## 2020-10-19 LAB — CBC WITH DIFFERENTIAL/PLATELET
Abs Immature Granulocytes: 0.02 10*3/uL (ref 0.00–0.07)
Basophils Absolute: 0 10*3/uL (ref 0.0–0.1)
Basophils Relative: 1 %
Eosinophils Absolute: 0.2 10*3/uL (ref 0.0–0.5)
Eosinophils Relative: 3 %
HCT: 41.3 % (ref 36.0–46.0)
Hemoglobin: 13.7 g/dL (ref 12.0–15.0)
Immature Granulocytes: 0 %
Lymphocytes Relative: 25 %
Lymphs Abs: 1.5 10*3/uL (ref 0.7–4.0)
MCH: 32.9 pg (ref 26.0–34.0)
MCHC: 33.2 g/dL (ref 30.0–36.0)
MCV: 99 fL (ref 80.0–100.0)
Monocytes Absolute: 0.6 10*3/uL (ref 0.1–1.0)
Monocytes Relative: 10 %
Neutro Abs: 3.6 10*3/uL (ref 1.7–7.7)
Neutrophils Relative %: 61 %
Platelets: 231 10*3/uL (ref 150–400)
RBC: 4.17 MIL/uL (ref 3.87–5.11)
RDW: 12.9 % (ref 11.5–15.5)
WBC: 5.8 10*3/uL (ref 4.0–10.5)
nRBC: 0 % (ref 0.0–0.2)

## 2020-10-19 LAB — LACTIC ACID, PLASMA: Lactic Acid, Venous: 1.3 mmol/L (ref 0.5–1.9)

## 2020-10-19 LAB — LIPASE, BLOOD: Lipase: 30 U/L (ref 11–51)

## 2020-10-19 MED ORDER — CEPHALEXIN 500 MG PO CAPS: 500.0000 mg | ORAL_CAPSULE | Freq: Two times a day (BID) | ORAL | 0 refills | Status: AC

## 2020-10-19 MED ORDER — HYDROCODONE-ACETAMINOPHEN 5-325 MG PO TABS
1.0000 | ORAL_TABLET | Freq: Once | ORAL | Status: AC
  Administered 2020-10-19: 1 via ORAL
  Filled 2020-10-19: qty 1

## 2020-10-19 MED ORDER — SODIUM CHLORIDE 0.9 % IV SOLN
1.0000 g | Freq: Once | INTRAVENOUS | Status: AC
  Administered 2020-10-19: 1 g via INTRAVENOUS
  Filled 2020-10-19: qty 10

## 2020-10-19 NOTE — ED Notes (Signed)
Attempted to call [facility] Peterson for report but no answer. Educated patient and daughter and discharge instructions.

## 2020-10-19 NOTE — ED Notes (Signed)
Lab called, states they will add on urine culture

## 2020-10-19 NOTE — ED Notes (Signed)
[facility] called for transport.  

## 2020-10-19 NOTE — ED Triage Notes (Signed)
Per ems: Pt coming from [place] c/o lower abdominal pain, painful urination and incontinence that start got worse today. A&Ox4 and ambulatory with assistance.  98% room air 150/74

## 2020-10-19 NOTE — Discharge Instructions (Addendum)
You were seen today for urinary tract infection, please take the antibiotics as directed.  Your symptoms should start resolving in the next 24 to 48 hours, if you feel like your symptoms are worsening please come back to the emergency department.  If you begin to have any confusion or any severe reactions to the antibiotics please come back to the emergency department.  Please follow-up with your primary care in the next couple of days at your facility.

## 2020-10-19 NOTE — ED Provider Notes (Signed)
Raven Peterson   CSN: [id number] Arrival date & time: 10/19/20  1818     History Chief Complaint  Patient presents with  . Urinary Tract Infection    Raven Peterson is a 86 y.o. female with pertinent past medical history of CVA on Eliquis, hypertension, seizure, thyroid disease presents the emergency department today for suprapubic pain, dysuria, incontinence that began yesterday and worsened today.  States that she was in normal health before this.  Patient lives at [place].  States that she does have a daughter in town, and [place].  Also states that she has some suprapubic pain that began this morning.  States that she felt nauseous yesterday, however did not feel nauseous today.  No vomiting.    States that she has been taking her medications including her Eliquis and hypertensive medications.  Patient does take Norco for her chronic back pain, has not taken it today.  Denies any new back pain, chest pain, shortness of breath, numbness, tingling, saddle paresthesias, fevers, chills.  Denies any bowel incontinence or or urinary retention. HPI     Past Medical History:  Diagnosis Date  . Acute ischemic stroke (HCC) 10/02/2014   right cerebellar and left posterior temporal CVA: This was confirmed on MRI. We suspected an embolic source given the distribution on MRI. She has no history of atrial fib and no atrial fib was seen on telemetry. She was seen by neurology in consultation and underwent carotid US which showed mild right carotid stenosis (<50%) and moderate left carotid stenosis (50-69%). She was treated with aspiri  . CAP (community acquired pneumonia) 03/17/2015  . Chronic back pain    "used to get shots in my back by a Pain Management doctor"  . Hypertension   . Inferior pubic ramus fracture, right, with routine healing, subsequent encounter   . Seizure (HCC) 10/02/2014  . Thyroid disease   . UTI from E.coli 2018  . Vertebral  compression fracture [facility])     Patient Active Problem List   Diagnosis Date Noted  . Acute diastolic HF (heart failure) (HCC)   . Dyspnea 06/01/2019  . Chronic anticoagulation 05/30/2019  . Hypothyroidism 05/30/2019  . Hyperlipidemia 05/30/2019  . Atherosclerosis of abdominal aorta (HCC) 05/30/2019  . Ileus (HCC) 05/30/2019  . SBO (small bowel obstruction) (HCC) 05/30/2019  . Elevated troponin level 05/30/2019  . Dependent on walker for ambulation 05/30/2019  . Essential hypertension 10/02/2014  . GERD (gastroesophageal reflux disease) 10/02/2014  . Glaucoma 10/02/2014  . History of stroke 05/29/2014    Past Surgical History:  Procedure Laterality Date  . CATARACT EXTRACTION, BILATERAL    . CHOLECYSTECTOMY       OB History   No obstetric history on file.     Family History  Problem Relation Age of Onset  . Healthy Mother   . Asthma Father     Social History   Tobacco Use  . Smoking status: Never Smoker  . Smokeless tobacco: Never Used  Vaping Use  . Vaping Use: Never used  Substance Use Topics  . Alcohol use: No  . Drug use: No    Home Medications Prior to Admission medications   Medication Sig Start Date End Date Taking? Authorizing Provider  acetaminophen (TYLENOL) 500 MG tablet Take 500 mg by mouth 2 (two) times daily as needed for mild pain.    [provider]  apixaban (ELIQUIS) 2.5 MG TABS tablet Take 2.5 mg by mouth 2 (two) times daily. Reported  on 04/03/2016 06/16/15 05/30/19  [provider]  atorvastatin (LIPITOR) 40 MG tablet Take 40 mg by mouth daily.    [provider]  benzonatate (TESSALON) 100 MG capsule Take 1 capsule (100 mg total) by mouth every 8 (eight) hours. 07/07/18   Georgetta HaberBurky, Natalie B, NP  carboxymethylcellulose (REFRESH PLUS) 0.5 % SOLN Place 1 drop into both eyes every morning.    [provider]  cephALEXin (KEFLEX) 500 MG capsule Take 1 capsule (500 mg total) by mouth 2 (two) times daily for 7 days.  10/19/20 10/26/20  Farrel GordonPatel, Jewell Ryans, PA-C  HYDROcodone-acetaminophen (NORCO/VICODIN) 5-325 MG tablet Take 1 tablet by mouth 3 (three) times daily as needed for moderate pain. 06/03/19   Arrien, York RamMauricio Daniel, MD  Lacosamide (VIMPAT) 100 MG TABS Take 100 mg by mouth 2 (two) times daily.  07/13/15 05/30/19  [provider]  latanoprost (XALATAN) 0.005 % ophthalmic solution Place 1 drop into both eyes at bedtime.     [provider]  levothyroxine (SYNTHROID, LEVOTHROID) 25 MCG tablet Take 25 mcg by mouth daily before breakfast.  07/13/15   [provider]  lisinopril (PRINIVIL,ZESTRIL) 5 MG tablet Take 5 mg by mouth daily.     [provider]  methocarbamol (ROBAXIN) 500 MG tablet Take 500 mg by mouth 2 (two) times daily as needed for muscle spasms.    [provider]  montelukast (SINGULAIR) 10 MG tablet Take by mouth at bedtime.     [provider]  omeprazole (PRILOSEC) 20 MG capsule Take 20 mg by mouth daily.    [provider]  polyethylene glycol (MIRALAX / GLYCOLAX) 17 g packet Take 17 g by mouth daily. 06/03/19   Arrien, York RamMauricio Daniel, MD    Allergies    Cimetidine, Naproxen sodium, Fluzone [flu virus vaccine], Fosamax [alendronate sodium], Loratadine, and Iodinated diagnostic agents  Review of Systems   Review of Systems  Constitutional: Negative for chills, diaphoresis, fatigue and fever.  HENT: Negative for congestion, sore throat and trouble swallowing.   Eyes: Negative for pain and visual disturbance.  Respiratory: Negative for cough, shortness of breath and wheezing.   Cardiovascular: Negative for chest pain, palpitations and leg swelling.  Gastrointestinal: Positive for abdominal pain. Negative for abdominal distention, diarrhea, nausea and vomiting.  Genitourinary: Positive for dysuria, enuresis and frequency. Negative for difficulty urinating, flank pain, hematuria, pelvic pain, vaginal bleeding, vaginal discharge and  vaginal pain.  Musculoskeletal: Negative for back pain, neck pain and neck stiffness.  Skin: Negative for pallor.  Neurological: Negative for dizziness, speech difficulty, weakness and headaches.  Psychiatric/Behavioral: Negative for confusion.    Physical Exam Updated Vital Signs BP (!) 165/68   Pulse 61   Temp 97.6 F (36.4 C) (Oral)   Resp 20   SpO2 98%   Physical Exam Constitutional:      General: She is not in acute distress.    Appearance: Normal appearance. She is not ill-appearing, toxic-appearing or diaphoretic.     Comments: Patient is a very pleasant 86 year old in no acute distress.  HENT:     Mouth/Throat:     Mouth: Mucous membranes are moist.     Pharynx: Oropharynx is clear.  Eyes:     General: No scleral icterus.    Extraocular Movements: Extraocular movements intact.     Pupils: Pupils are equal, round, and reactive to light.  Cardiovascular:     Rate and Rhythm: Normal rate and regular rhythm.     Pulses: Normal pulses.  Heart sounds: Normal heart sounds.  Pulmonary:     Effort: Pulmonary effort is normal. No respiratory distress.     Breath sounds: Normal breath sounds. No stridor. No wheezing, rhonchi or rales.  Chest:     Chest wall: No tenderness.  Abdominal:     General: Abdomen is flat. There is no distension.     Palpations: Abdomen is soft.     Tenderness: There is abdominal tenderness (Very mild tenderness to suprapubic region without guarding). There is no guarding or rebound.  Musculoskeletal:        General: No swelling or tenderness. Normal range of motion.     Cervical back: Normal range of motion and neck supple. No rigidity.     Right lower leg: No edema.     Left lower leg: No edema.     Comments: No cervical, thoracic or lumbar midline tenderness.  No objective numbness to groin area.  Skin:    General: Skin is warm and dry.     Capillary Refill: Capillary refill takes less than 2 seconds.     Coloration: Skin is not pale.   Neurological:     General: No focal deficit present.     Mental Status: She is alert and oriented to person, place, and time.     Cranial Nerves: No cranial nerve deficit.     Sensory: No sensory deficit.     Motor: No weakness.     Coordination: Coordination normal.     Comments: Patient is alert and oriented x4.   Psychiatric:        Mood and Affect: Mood normal.        Behavior: Behavior normal.     ED Results / Procedures / Treatments   Labs (all labs ordered are listed, but only abnormal results are displayed) Labs Reviewed  COMPREHENSIVE METABOLIC PANEL - Abnormal; Notable for the following components:      Result Value   CO2 21 (*)    Glucose, Bld 107 (*)    All other components within normal limits  URINALYSIS, ROUTINE W REFLEX MICROSCOPIC - Abnormal; Notable for the following components:   Color, Urine STRAW (*)    Nitrite POSITIVE (*)    Leukocytes,Ua MODERATE (*)    Bacteria, UA MANY (*)    All other components within normal limits  URINE CULTURE  CBC WITH DIFFERENTIAL/PLATELET  LIPASE, BLOOD  LACTIC ACID, PLASMA    EKG None  Radiology No results found.  Procedures Procedures (including critical care time)  Medications Ordered in ED Medications  cefTRIAXone (ROCEPHIN) 1 g in sodium chloride 0.9 % 100 mL IVPB (1 g Intravenous New Bag/Given (Non-Interop) 10/19/20 2140)  HYDROcodone-acetaminophen (NORCO/VICODIN) 5-325 MG per tablet 1 tablet (1 tablet Oral Given 10/19/20 1928)    ED Course  I have reviewed the triage vital signs and the nursing notes.  Pertinent labs & imaging results that were available during my care of the patient were reviewed by me and considered in my medical decision making (see chart for details).    MDM Rules/Calculators/A&P                          Elyzabeth Goatley is a 86 y.o. female with pertinent past medical history of CVA on Eliquis, hypertension, seizure, thyroid disease presents the emergency department today for suprapubic  pain, dysuria, incontinence that began yesterday and worsened today.  No retention.  Basic lab order, bladder scan ordered and UA  ordered, patient presentation most consistent with UTI.  Patient appears very well, no confusion, afebrile.  Very healthy 86 year old.    Work-up today consistent with UTI, CBC with no leukocytosis, CMP unremarkable, lipase normal.  Lactic acid normal.  Patient given 1 g of Rocephin here in the ER, patient to be discharged at this time.  Did discuss CT abdomen with Dr. Rhunette Croft, we do not think this is necessary at this time.  Patient has clear UTI, symptomatic treatment discussed with daughter and patient who here reliable.  Patient to be discharged at this time.  Doubt need for further emergent work up at this time. I explained the diagnosis and have given explicit precautions to return to the ER including for any other new or worsening symptoms. The patient understands and accepts the medical plan as it's been dictated and I have answered their questions. Discharge instructions concerning home care and prescriptions have been given. The patient is STABLE and is discharged to home in good condition.  I discussed this case with my attending physician who cosigned this Peterson including patient's presenting symptoms, physical exam, and planned diagnostics and interventions. Attending physician stated agreement with plan or made changes to plan which were implemented.   Attending physician assessed patient at bedside.  Final Clinical Impression(s) / ED Diagnoses Final diagnoses:  Acute cystitis without hematuria    Rx / DC Orders ED Discharge Orders         Ordered    cephALEXin (KEFLEX) 500 MG capsule  2 times daily        10/19/20 2136           Farrel Gordon, PA-C 10/19/20 2157    Derwood Kaplan, MD 10/19/20 [phone]

## 2020-10-22 LAB — URINE CULTURE: Culture: >=100000 — AB

## 2020-10-23 ENCOUNTER — Telehealth: Payer: Self-pay | Admitting: *Deleted

## 2020-10-23 NOTE — Telephone Encounter (Signed)
Post ED Visit - Positive Culture Follow-up  Culture report reviewed by antimicrobial stewardship pharmacist: [facility] []  , Pharm.D. []  Enzo Bi, Pharm.D., BCPS AQ-ID []  , Pharm.D., BCPS []  Celedonio Miyamoto, Pharm.D., BCPS []  [place], Garvin Fila.D., BCPS, AAHIVP []  , Pharm.D., BCPS, AAHIVP []  Georgina Pillion, PharmD, BCPS []  , PharmD, BCPS []  [place], PharmD, BCPS []  [address], PharmD []  , PharmD, BCPS []  Estella Husk, PharmD  Pharmacy Team []  Lysle Pearl, PharmD []  , PharmD [x]  Phillips Climes, PharmD []  , Rph []  Agapito Games) , PharmD []  Verlan Friends, PharmD []  , PharmD []  Mervyn Gay, PharmD []  , PharmD []  Vinnie Level, PharmD []  [facility], PharmD []  , PharmD []  Len Childs, PharmD   Positive urine culture Treated with Cephalexin, organism sensitive to the same and no further patient follow-up is required at this time.  [facility] 10/23/2020, 12:30 PM

## 2020-12-10 ENCOUNTER — Other Ambulatory Visit: Payer: Self-pay

## 2020-12-10 ENCOUNTER — Emergency Department (HOSPITAL_COMMUNITY): Payer: Medicare Other

## 2020-12-10 ENCOUNTER — Inpatient Hospital Stay (HOSPITAL_COMMUNITY)
Admission: EM | Admit: 2020-12-10 | Discharge: 2020-12-15 | DRG: 177 | Disposition: A | Discharge status: Skilled Nursing Facility | Payer: Medicare Other | Attending: Internal Medicine | Admitting: Internal Medicine

## 2020-12-10 DIAGNOSIS — I5033 Acute on chronic diastolic (congestive) heart failure: Secondary | ICD-10-CM | POA: Diagnosis present

## 2020-12-10 DIAGNOSIS — Z66 Do not resuscitate: Secondary | ICD-10-CM | POA: Diagnosis present

## 2020-12-10 DIAGNOSIS — Z887 Allergy status to serum and vaccine status: Secondary | ICD-10-CM

## 2020-12-10 DIAGNOSIS — J1282 Pneumonia due to coronavirus disease 2019: Secondary | ICD-10-CM | POA: Diagnosis present

## 2020-12-10 DIAGNOSIS — U071 COVID-19: Secondary | ICD-10-CM

## 2020-12-10 DIAGNOSIS — E039 Hypothyroidism, unspecified: Secondary | ICD-10-CM | POA: Diagnosis present

## 2020-12-10 DIAGNOSIS — E872 Acidosis: Secondary | ICD-10-CM | POA: Diagnosis present

## 2020-12-10 DIAGNOSIS — Z9989 Dependence on other enabling machines and devices: Secondary | ICD-10-CM

## 2020-12-10 DIAGNOSIS — M79662 Pain in left lower leg: Secondary | ICD-10-CM | POA: Diagnosis present

## 2020-12-10 DIAGNOSIS — G8929 Other chronic pain: Secondary | ICD-10-CM | POA: Diagnosis present

## 2020-12-10 DIAGNOSIS — K219 Gastro-esophageal reflux disease without esophagitis: Secondary | ICD-10-CM | POA: Diagnosis present

## 2020-12-10 DIAGNOSIS — Z79890 Hormone replacement therapy (postmenopausal): Secondary | ICD-10-CM

## 2020-12-10 DIAGNOSIS — Z86718 Personal history of other venous thrombosis and embolism: Secondary | ICD-10-CM

## 2020-12-10 DIAGNOSIS — Z7901 Long term (current) use of anticoagulants: Secondary | ICD-10-CM

## 2020-12-10 DIAGNOSIS — N179 Acute kidney failure, unspecified: Secondary | ICD-10-CM | POA: Diagnosis present

## 2020-12-10 DIAGNOSIS — I110 Hypertensive heart disease with heart failure: Secondary | ICD-10-CM | POA: Diagnosis present

## 2020-12-10 DIAGNOSIS — M549 Dorsalgia, unspecified: Secondary | ICD-10-CM | POA: Diagnosis present

## 2020-12-10 DIAGNOSIS — Z79899 Other long term (current) drug therapy: Secondary | ICD-10-CM

## 2020-12-10 DIAGNOSIS — Z8673 Personal history of transient ischemic attack (TIA), and cerebral infarction without residual deficits: Secondary | ICD-10-CM

## 2020-12-10 DIAGNOSIS — Z825 Family history of asthma and other chronic lower respiratory diseases: Secondary | ICD-10-CM

## 2020-12-10 DIAGNOSIS — Z888 Allergy status to other drugs, medicaments and biological substances status: Secondary | ICD-10-CM

## 2020-12-10 DIAGNOSIS — R011 Cardiac murmur, unspecified: Secondary | ICD-10-CM | POA: Diagnosis present

## 2020-12-10 DIAGNOSIS — Z283 Underimmunization status: Secondary | ICD-10-CM

## 2020-12-10 DIAGNOSIS — I10 Essential (primary) hypertension: Secondary | ICD-10-CM | POA: Diagnosis present

## 2020-12-10 DIAGNOSIS — Z91041 Radiographic dye allergy status: Secondary | ICD-10-CM

## 2020-12-10 DIAGNOSIS — J9601 Acute respiratory failure with hypoxia: Secondary | ICD-10-CM | POA: Diagnosis present

## 2020-12-10 HISTORY — DX: COVID-19: U07.1

## 2020-12-10 HISTORY — DX: Pneumonia due to coronavirus disease 2019: J12.82

## 2020-12-10 LAB — CBC
HCT: 48.7 % — ABNORMAL HIGH (ref 36.0–46.0)
Hemoglobin: 15.7 g/dL — ABNORMAL HIGH (ref 12.0–15.0)
MCH: 32.4 pg (ref 26.0–34.0)
MCHC: 32.2 g/dL (ref 30.0–36.0)
MCV: 100.6 fL — ABNORMAL HIGH (ref 80.0–100.0)
Platelets: 302 10*3/uL (ref 150–400)
RBC: 4.84 MIL/uL (ref 3.87–5.11)
RDW: 12.9 % (ref 11.5–15.5)
WBC: 11.8 10*3/uL — ABNORMAL HIGH (ref 4.0–10.5)
nRBC: 0 % (ref 0.0–0.2)

## 2020-12-10 LAB — RESP PANEL BY RT-PCR (FLU A&B, COVID) ARPGX2
Influenza A by PCR: NEGATIVE
Influenza B by PCR: NEGATIVE
SARS Coronavirus 2 by RT PCR: POSITIVE — AB

## 2020-12-10 LAB — BASIC METABOLIC PANEL
Anion gap: 16 — ABNORMAL HIGH (ref 5–15)
BUN: 11 mg/dL (ref 8–23)
CO2: 20 mmol/L — ABNORMAL LOW (ref 22–32)
Calcium: 9 mg/dL (ref 8.9–10.3)
Chloride: 103 mmol/L (ref 98–111)
Creatinine, Ser: 1.2 mg/dL — ABNORMAL HIGH (ref 0.44–1.00)
GFR, Estimated: 41 mL/min — ABNORMAL LOW (ref >60)
Glucose, Bld: 301 mg/dL — ABNORMAL HIGH (ref 70–99)
Potassium: 3.7 mmol/L (ref 3.5–5.1)
Sodium: 139 mmol/L (ref 135–145)

## 2020-12-10 LAB — HEPATIC FUNCTION PANEL
ALT: 16 U/L (ref 0–44)
AST: 26 U/L (ref 15–41)
Albumin: 3.6 g/dL (ref 3.5–5.0)
Alkaline Phosphatase: 85 U/L (ref 38–126)
Bilirubin, Direct: 0.3 mg/dL — ABNORMAL HIGH (ref 0.0–0.2)
Indirect Bilirubin: 0.7 mg/dL (ref 0.3–0.9)
Total Bilirubin: 1 mg/dL (ref 0.3–1.2)
Total Protein: 6.6 g/dL (ref 6.5–8.1)

## 2020-12-10 LAB — BRAIN NATRIURETIC PEPTIDE: B Natriuretic Peptide: 706.7 pg/mL — ABNORMAL HIGH (ref 0.0–100.0)

## 2020-12-10 LAB — TROPONIN I (HIGH SENSITIVITY)
Troponin I (High Sensitivity): 124 ng/L (ref <18)
Troponin I (High Sensitivity): 256 ng/L (ref <18)

## 2020-12-10 MED ORDER — ACETAMINOPHEN 325 MG PO TABS
650.0000 mg | ORAL_TABLET | Freq: Four times a day (QID) | ORAL | Status: DC | PRN
  Administered 2020-12-11: 650 mg via ORAL
  Filled 2020-12-10: qty 2

## 2020-12-10 MED ORDER — POLYETHYLENE GLYCOL 3350 17 G PO PACK: 17.0000 g | PACK | Freq: Every day | ORAL | Status: DC | PRN

## 2020-12-10 MED ORDER — SODIUM CHLORIDE 0.9 % IV SOLN
200.0000 mg | Freq: Once | INTRAVENOUS | Status: AC
  Administered 2020-12-11: 200 mg via INTRAVENOUS
  Filled 2020-12-10: qty 40

## 2020-12-10 MED ORDER — LACOSAMIDE 50 MG PO TABS
100.0000 mg | ORAL_TABLET | Freq: Two times a day (BID) | ORAL | Status: DC
Start: 2020-12-10 — End: 2020-12-15
  Administered 2020-12-11 – 2020-12-14 (×8): 100 mg via ORAL
  Filled 2020-12-10 (×8): qty 2

## 2020-12-10 MED ORDER — GUAIFENESIN-DM 100-10 MG/5ML PO SYRP
10.0000 mL | ORAL_SOLUTION | ORAL | Status: DC | PRN
  Administered 2020-12-12: 10 mL via ORAL
  Filled 2020-12-10: qty 10

## 2020-12-10 MED ORDER — ASPIRIN 81 MG PO CHEW
324.0000 mg | CHEWABLE_TABLET | Freq: Once | ORAL | Status: AC
  Administered 2020-12-10: 324 mg via ORAL
  Filled 2020-12-10: qty 4

## 2020-12-10 MED ORDER — ZINC SULFATE 220 (50 ZN) MG PO CAPS
220.0000 mg | ORAL_CAPSULE | Freq: Every day | ORAL | Status: DC
  Administered 2020-12-11: 220 mg via ORAL
  Filled 2020-12-10: qty 1

## 2020-12-10 MED ORDER — MONTELUKAST SODIUM 10 MG PO TABS
10.0000 mg | ORAL_TABLET | Freq: Every day | ORAL | Status: DC
  Administered 2020-12-11 – 2020-12-14 (×5): 10 mg via ORAL
  Filled 2020-12-10 (×6): qty 1

## 2020-12-10 MED ORDER — ATORVASTATIN CALCIUM 40 MG PO TABS
40.0000 mg | ORAL_TABLET | Freq: Every day | ORAL | Status: DC
  Administered 2020-12-11 – 2020-12-14 (×4): 40 mg via ORAL
  Filled 2020-12-10 (×4): qty 1

## 2020-12-10 MED ORDER — APIXABAN 2.5 MG PO TABS
2.5000 mg | ORAL_TABLET | Freq: Two times a day (BID) | ORAL | Status: DC
  Administered 2020-12-11 – 2020-12-14 (×9): 2.5 mg via ORAL
  Filled 2020-12-10 (×12): qty 1

## 2020-12-10 MED ORDER — PANTOPRAZOLE SODIUM 40 MG PO TBEC
40.0000 mg | DELAYED_RELEASE_TABLET | Freq: Every day | ORAL | Status: DC
  Administered 2020-12-11 – 2020-12-14 (×4): 40 mg via ORAL
  Filled 2020-12-10 (×4): qty 1

## 2020-12-10 MED ORDER — ASCORBIC ACID 500 MG PO TABS
500.0000 mg | ORAL_TABLET | Freq: Every day | ORAL | Status: DC
  Administered 2020-12-11 – 2020-12-14 (×4): 500 mg via ORAL
  Filled 2020-12-10 (×5): qty 1

## 2020-12-10 MED ORDER — LATANOPROST 0.005 % OP SOLN
1.0000 [drp] | Freq: Every day | OPHTHALMIC | Status: DC
  Administered 2020-12-11 – 2020-12-13 (×3): 1 [drp] via OPHTHALMIC
  Filled 2020-12-10 (×2): qty 2.5

## 2020-12-10 MED ORDER — LEVOTHYROXINE SODIUM 25 MCG PO TABS
25.0000 ug | ORAL_TABLET | Freq: Every day | ORAL | Status: DC
  Administered 2020-12-11 – 2020-12-14 (×4): 25 ug via ORAL
  Filled 2020-12-10 (×4): qty 1

## 2020-12-10 MED ORDER — DEXAMETHASONE SODIUM PHOSPHATE 10 MG/ML IJ SOLN
6.0000 mg | Freq: Every day | INTRAMUSCULAR | Status: DC
  Administered 2020-12-10: 6 mg via INTRAVENOUS
  Filled 2020-12-10: qty 1

## 2020-12-10 MED ORDER — ALBUTEROL SULFATE HFA 108 (90 BASE) MCG/ACT IN AERS
2.0000 | INHALATION_SPRAY | RESPIRATORY_TRACT | Status: DC | PRN
  Administered 2020-12-10: 2 via RESPIRATORY_TRACT
  Filled 2020-12-10: qty 6.7

## 2020-12-10 MED ORDER — SODIUM CHLORIDE 0.9 % IV SOLN
100.0000 mg | Freq: Every day | INTRAVENOUS | Status: DC
  Administered 2020-12-11: 100 mg via INTRAVENOUS
  Filled 2020-12-10: qty 20

## 2020-12-10 NOTE — H&P (Incomplete)
NAME:  Raven Peterson, MRN:  [id number], DOB:  December 19, 1921, LOS: 0 ADMISSION DATE:  12/10/2020, Primary: Patient, No Pcp Per  CHIEF COMPLAINT:  dyspnea  Medical Service: Internal Medicine Teaching Service         Attending Physician: Dr. Benjiman Core, MD    First Contact: Dr. Elaina Pattee Pager: [phone]  Second Contact: Dr. Marchia Bond Pager: [phone]       After Hours (After 5p/  First Contact Pager: [phone]  weekends / holidays): Second Contact Pager: [phone]   HISTORY OF PRESENT ILLNESS  Raven Peterson is 86yo female with hypertension, chronic diastolic heart failure, previous embolic CVA on chronic anticoagulation, hypothyroidism, GERD presenting to ED via EMS from [facility] with dyspnea, chest pain.  PCP: Patient, No Pcp Per  ED COURSE  ***  PAST MEDICAL HISTORY  She,  has a past medical history of Acute ischemic stroke (HCC) (10/02/2014), CAP (community acquired pneumonia) (03/17/2015), Chronic back pain, Hypertension, Inferior pubic ramus fracture, right, with routine healing, subsequent encounter, Seizure (HCC) (10/02/2014), Thyroid disease, UTI from E.coli (2018), and Vertebral compression fracture (HCC).   HOME MEDICATIONS   Prior to Admission medications   Medication Sig Start Date End Date Taking? Authorizing Provider  acetaminophen (TYLENOL) 500 MG tablet Take 500 mg by mouth 2 (two) times daily as needed for mild pain.    [provider]  apixaban (ELIQUIS) 2.5 MG TABS tablet Take 2.5 mg by mouth 2 (two) times daily. Reported on 04/03/2016 06/16/15 05/30/19  [provider]  atorvastatin (LIPITOR) 40 MG tablet Take 40 mg by mouth daily.    [provider]  benzonatate (TESSALON) 100 MG capsule Take 1 capsule (100 mg total) by mouth every 8 (eight) hours. 07/07/18   Georgetta Haber, NP  carboxymethylcellulose (REFRESH PLUS) 0.5 % SOLN Place 1 drop into both eyes every morning.    [provider]  HYDROcodone-acetaminophen (NORCO/VICODIN)  5-325 MG tablet Take 1 tablet by mouth 3 (three) times daily as needed for moderate pain. 06/03/19   Arrien, York Ram, MD  Lacosamide (VIMPAT) 100 MG TABS Take 100 mg by mouth 2 (two) times daily.  07/13/15 05/30/19  [provider]  latanoprost (XALATAN) 0.005 % ophthalmic solution Place 1 drop into both eyes at bedtime.     [provider]  levothyroxine (SYNTHROID, LEVOTHROID) 25 MCG tablet Take 25 mcg by mouth daily before breakfast.  07/13/15   [provider]  lisinopril (PRINIVIL,ZESTRIL) 5 MG tablet Take 5 mg by mouth daily.     [provider]  methocarbamol (ROBAXIN) 500 MG tablet Take 500 mg by mouth 2 (two) times daily as needed for muscle spasms.    [provider]  montelukast (SINGULAIR) 10 MG tablet Take by mouth at bedtime.     [provider]  omeprazole (PRILOSEC) 20 MG capsule Take 20 mg by mouth daily.    [provider]  polyethylene glycol (MIRALAX / GLYCOLAX) 17 g packet Take 17 g by mouth daily. 06/03/19   Arrien, York Ram, MD   ALLERGIES   Allergies as of 12/10/2020 - Review Complete 12/10/2020  Allergen Reaction Noted  . Cimetidine Other (See Comments) 01/26/2016  . Naproxen sodium Other (See Comments) 01/26/2016  . Fluzone [influenza virus vaccine]  05/30/2019  . Fosamax [alendronate sodium]  05/30/2019  . Loratadine  05/30/2019  . Iodinated diagnostic agents Nausea Only 10/03/2014   SOCIAL HISTORY  ***  FAMILY HISTORY  Her family history includes Asthma in her father;  Healthy in her mother.   REVIEW OF SYSTEMS  ROS per history of present illness.  PHYSICAL EXAMINATION  Blood pressure (!) 118/57, pulse 94, temperature 98.2 F (36.8 C), temperature source Axillary, resp. rate (!) 23, height 5\' 2"  (1.575 m), weight 68.6 kg, SpO2 98 %.    Filed Weights   12/10/20 2000  Weight: 68.6 kg   GENERAL:  HENT:  EYES:  CV:  PULM:  ABD:   MSK:  SKIN:  NEURO:  PSYCH:   SIGNIFICANT  DIAGNOSTIC TESTS  ECG:  I personally reviewed patient's ECG with my interpretation as above.  CXR:   I personally reviewed patient's CXR with my interpretation as above.  LABS   CBC Latest Ref Rng & Units 12/10/2020 10/19/2020 06/02/2019  WBC 4.0 - 10.5 K/uL 11.8(H) 5.8 8.8  Hemoglobin 12.0 - 15.0 g/dL 15.7(H) 13.7 14.6  Hematocrit 36.0 - 46.0 % 48.7(H) 41.3 46.7(H)  Platelets 150 - 400 K/uL 302 231 219   BMP Latest Ref Rng & Units 12/10/2020 10/19/2020 06/03/2019  Glucose 70 - 99 mg/dL 06/05/2019) 858(I) 502(D)  BUN 8 - 23 mg/dL 11 15 16   Creatinine 0.44 - 1.00 mg/dL 741(O) 8.78(M  Sodium 135 - 145 mmol/L 139 137 139  Potassium 3.5 - 5.1 mmol/L 3.7 4.2 3.5  Chloride 98 - 111 mmol/L 103 106 98  CO2 22 - 32 mmol/L 20(L) 21(L) 30  Calcium 8.9 - 10.3 mg/dL 9.0 8.9 7.67)    CONSULTS  none  ASSESSMENT  Raven Peterson is 86yo female with hypertension, chronic diastolic heart failure, previous embolic CVA on chronic anticoagulation, hypothyroidism, GERD admitted 12/31 with COVID-19 pneumonia.  PLAN  Active Problems:   * No active hospital problems. *  #Acute hypoxic respiratory failure 2/2 COVID-19 pneumonia   #Chest pain #Troponinemia   #Acute kidney injury #Anion gap metabolic acidosis   #Chronic diastolic heart failure   #Hypertension   #Hx CVA on chronic anticoagulation   #GERD   BEST PRACTICE  DIET: IVF: DVT PPX: BOWEL: CODE: FAM COM:   DISPO: Admit patient to Inpatient with expected length of stay greater than 2 midnights.  86yo, MD Internal Medicine Resident PGY-1 PAGER: [phone] 12/10/2020 10:19 PM  If after hours (below), please contact on-call pager: [phone] 5PM-7AM Monday-Friday 1PM-7AM Saturday-Sunday

## 2020-12-10 NOTE — ED Notes (Addendum)
Pt bib [place].  Per ems sob at 6pm, cough started at lunch with, -diminished on right side, 52 resp per minute, work of breathing the same. 125 of solumedrol. 22 left ac, tolerated cpap did not improve status 60 ra and nrb 85. Also experiencing cp with sob. Pt has gold dnr paper with her.

## 2020-12-10 NOTE — Progress Notes (Signed)
Patient trialed off BIPAP at this time. Patient placed on 8L [place]. Patient tolerating well at this time.

## 2020-12-10 NOTE — Hospital Course (Addendum)
Admitted 12/10/2020  Allergies: Cimetidine, Naproxen sodium, Fluzone [influenza virus vaccine], Fosamax [alendronate sodium], Loratadine, and Iodinated diagnostic agents Pertinent Hx: diastolic HF, CVA (presumed to be cardioembolic) on Eliquis, HTN, seizure, hypothyroidism, HLD, GERD  86 y.o. female p/w with SOB, cough starting at lunch. Coming from [place].  * Acute hypoxic resp failure, COVID pneumonia: Improved on BiPAP>9li HFNC.  *HFpEF: LEE and congestion on CXR. BNP ~700. Giving 40 mg IV lasix.   *Elevated trop: Chest pain (resolved) and DT depression/Q wave in ant leads. elevated Trop. Likely demand ischemia. Currently CP free. Trending Trop>Talked to cards: Likely demand. No intervention as she is asymptomatic.    *AKI: Suspect to be due to volume overload. Giving lasix and repeat morning BMP.  *Left leg tenderness and swelling (L slightly>R). Checking doppler US to r/u DVT.   *Prolonged QTC: Avoiding QTC prolonging meds.   Consults: Cards  Meds: Remdecivir, Steroid, lasix (once), eliquis, vitamins, home meds VTE ppx: Eliquis IVF: None Diet: HH    * Raven Peterson *  []  f/u Trop and cards rec []  F/u Doppler   O/N events:   Covid positive Troponin 124  WBC 11.8, hemoglobin 15.7 HCO3 20, creatinine 1.2 (was 0.8 a month ago), GFR 41  CXR: [place] glass opacity, trace Rt pleural effusion Was given Solumedrol by EMS. ASA and albuterol, BIPAP in ED Home meds: Eliquis 2.5 mg twice daily, Lipitor, Norco, levothyroxine, lisinopril, methocarbamol, Singulair, omeprazole, PEG

## 2020-12-10 NOTE — ED Provider Notes (Signed)
[facility] EMERGENCY DEPARTMENT Provider Note   CSN: [id number] Arrival date & time: 12/10/20  1951     History Chief Complaint  Patient presents with  . Shortness of Breath  . Respiratory Distress    Raven Peterson is a 86 y.o. female.  HPI  Patient is a 86 year old female with a history of HTN, GERD, prior stroke LD, hypothyroidism, diastolic heart failure who presents to ED with respiratory distress, chest pain.  Per EMS, patient coming from [facility] with shortness of breath and cough starting at lunch today.  Received 125 mg Solu-Medrol and was placed on CPAP by EMS.  Patient tells me that she has been having central chest pain and dyspnea for about the last 2 hours; she is not exactly sure if it is low central chest pain or upper epigastric pain. The pain was initially a 9/10 in intensity, but has improved to a 6 or 7.  Patient has had a recent COVID exposure and is unvaccinated against COVID-19.  Patient has been transitioned to BiPAP upon arrival in the ED and is greatly improved from her initial presentation, with tachypnea improved and saturating well on current settings, FiO2 60%, 9 PC/9 PEEP. Very difficult to gather history as pt is on the BiPAP (difficult to understand her speech) and she is very hard of hearing.    Past Medical History:  Diagnosis Date  . Acute ischemic stroke (HCC) 10/02/2014   right cerebellar and left posterior temporal CVA: This was confirmed on MRI. We suspected an embolic source given the distribution on MRI. She has no history of atrial fib and no atrial fib was seen on telemetry. She was seen by neurology in consultation and underwent carotid US which showed mild right carotid stenosis (<50%) and moderate left carotid stenosis (50-69%). She was treated with aspiri  . CAP (community acquired pneumonia) 03/17/2015  . Chronic back pain    "used to get shots in my back by a Pain Management doctor"  . Hypertension   .  Inferior pubic ramus fracture, right, with routine healing, subsequent encounter   . Seizure (HCC) 10/02/2014  . Thyroid disease   . UTI from E.coli 2018  . Vertebral compression fracture [facility])     Patient Active Problem List   Diagnosis Date Noted  . Acute diastolic HF (heart failure) (HCC)   . Dyspnea 06/01/2019  . Chronic anticoagulation 05/30/2019  . Hypothyroidism 05/30/2019  . Hyperlipidemia 05/30/2019  . Atherosclerosis of abdominal aorta (HCC) 05/30/2019  . Ileus (HCC) 05/30/2019  . SBO (small bowel obstruction) (HCC) 05/30/2019  . Elevated troponin level 05/30/2019  . Dependent on walker for ambulation 05/30/2019  . Essential hypertension 10/02/2014  . GERD (gastroesophageal reflux disease) 10/02/2014  . Glaucoma 10/02/2014  . History of stroke 05/29/2014    Past Surgical History:  Procedure Laterality Date  . CATARACT EXTRACTION, BILATERAL    . CHOLECYSTECTOMY       OB History   No obstetric history on file.     Family History  Problem Relation Age of Onset  . Healthy Mother   . Asthma Father     Social History   Tobacco Use  . Smoking status: Never Smoker  . Smokeless tobacco: Never Used  Vaping Use  . Vaping Use: Never used  Substance Use Topics  . Alcohol use: No  . Drug use: No    Home Medications Prior to Admission medications   Medication Sig Start Date End Date Taking? Authorizing Provider  acetaminophen (TYLENOL) 500 MG tablet Take 500 mg by mouth 2 (two) times daily as needed for mild pain.    [provider]  apixaban (ELIQUIS) 2.5 MG TABS tablet Take 2.5 mg by mouth 2 (two) times daily. Reported on 04/03/2016 06/16/15 05/30/19  [provider]  atorvastatin (LIPITOR) 40 MG tablet Take 40 mg by mouth daily.    [provider]  benzonatate (TESSALON) 100 MG capsule Take 1 capsule (100 mg total) by mouth every 8 (eight) hours. 07/07/18   Georgetta Haber, NP  carboxymethylcellulose (REFRESH PLUS) 0.5 % SOLN Place 1  drop into both eyes every morning.    [provider]  HYDROcodone-acetaminophen (NORCO/VICODIN) 5-325 MG tablet Take 1 tablet by mouth 3 (three) times daily as needed for moderate pain. 06/03/19   Arrien, York Ram, MD  Lacosamide (VIMPAT) 100 MG TABS Take 100 mg by mouth 2 (two) times daily.  07/13/15 05/30/19  [provider]  latanoprost (XALATAN) 0.005 % ophthalmic solution Place 1 drop into both eyes at bedtime.     [provider]  levothyroxine (SYNTHROID, LEVOTHROID) 25 MCG tablet Take 25 mcg by mouth daily before breakfast.  07/13/15   [provider]  lisinopril (PRINIVIL,ZESTRIL) 5 MG tablet Take 5 mg by mouth daily.     [provider]  methocarbamol (ROBAXIN) 500 MG tablet Take 500 mg by mouth 2 (two) times daily as needed for muscle spasms.    [provider]  montelukast (SINGULAIR) 10 MG tablet Take by mouth at bedtime.     [provider]  omeprazole (PRILOSEC) 20 MG capsule Take 20 mg by mouth daily.    [provider]  polyethylene glycol (MIRALAX / GLYCOLAX) 17 g packet Take 17 g by mouth daily. 06/03/19   Arrien, York Ram, MD    Allergies    Cimetidine, Naproxen sodium, Fluzone [influenza virus vaccine], Fosamax [alendronate sodium], Loratadine, and Iodinated diagnostic agents  Review of Systems   Review of Systems  Constitutional: Positive for fever. Negative for chills.  HENT: Negative for ear pain and sore throat.   Eyes: Negative for pain and visual disturbance.  Respiratory: Positive for cough and shortness of breath.   Cardiovascular: Positive for chest pain and leg swelling.  Gastrointestinal: Negative for abdominal pain, diarrhea and vomiting.  Genitourinary: Negative for hematuria.  Skin: Positive for rash (Rash to intertiginous folds near groin). Negative for color change.  Neurological: Positive for headaches. Negative for syncope.  Psychiatric/Behavioral: Negative for confusion  and suicidal ideas.  All other systems reviewed and are negative.  Physical Exam Updated Vital Signs BP (!) 156/86   Pulse (!) 115   Temp 98.2 F (36.8 C) (Axillary)   Resp (!) 23   Ht 5\' 2"  (1.575 m)   Wt 68.6 kg   SpO2 97%   BMI 27.66 kg/m   Physical Exam Vitals and nursing note reviewed.  Constitutional:      General: She is in acute distress.     Appearance: She is well-developed and well-nourished. She is ill-appearing. She is not toxic-appearing.  HENT:     Head: Normocephalic and atraumatic.     Mouth/Throat:     Comments: BiPAP in place Eyes:     Conjunctiva/sclera: Conjunctivae normal.  Cardiovascular:     Rate and Rhythm: Regular rhythm. Tachycardia present.     Heart sounds: No murmur heard.   Pulmonary:     Effort: Respiratory distress present.     Breath sounds: Rhonchi present.  Abdominal:     Palpations: Abdomen is soft.     Tenderness: There is no abdominal tenderness.  Musculoskeletal:     Cervical back: Neck supple.     Right lower leg: Edema present.     Left lower leg: Edema present.     Comments: 1+ bilateral pitting edema  Skin:    General: Skin is warm and dry.     Comments: Intertrigo to bilateral inguinal creases  Neurological:     Mental Status: She is alert.     Comments: Alert, grossly oriented, follows commands and answers questions appropriately  Psychiatric:        Mood and Affect: Mood and affect normal.        Behavior: Behavior normal.     ED Results / Procedures / Treatments   Labs (all labs ordered are listed, but only abnormal results are displayed) Labs Reviewed  BASIC METABOLIC PANEL - Abnormal; Notable for the following components:      Result Value   CO2 20 (*)    Glucose, Bld 301 (*)    Creatinine, Ser 1.20 (*)    GFR, Estimated 41 (*)    Anion gap 16 (*)    All other components within normal limits  CBC - Abnormal; Notable for the following components:   WBC 11.8 (*)    Hemoglobin 15.7 (*)    HCT 48.7 (*)     MCV 100.6 (*)    All other components within normal limits  RESP PANEL BY RT-PCR (FLU A&B, COVID) ARPGX2  HEPATIC FUNCTION PANEL  BRAIN NATRIURETIC PEPTIDE  TROPONIN I (HIGH SENSITIVITY)    EKG None  Radiology No results found.  Procedures Procedures (including critical care time)  Medications Ordered in ED Medications  albuterol (VENTOLIN HFA) 108 (90 Base) MCG/ACT inhaler 2 puff (has no administration in time range)  aspirin chewable tablet 324 mg (has no administration in time range)    ED Course  I have reviewed the triage vital signs and the nursing notes.  Pertinent labs & imaging results that were available during my care of the patient were reviewed by me and considered in my medical decision making (see chart for details).    MDM Rules/Calculators/A&P                         86 year old female with chest pain, respiratory distress.  Patient initially in significant respiratory distress on arrival; placed on BiPAP and has had improvement.  Saturations up to high 90s on BiPAP; patient remains tachycardic and tachypneic.  Hemodynamically stable.  Rhonchorous throughout all lung fields.  Labs reviewed and notable for positive SARS-CoV-2, elevated troponin, elevated BNP, metabolic acidosis with anion gap 16, AKI (creatinine 1.2 up from baseline 0.8-0.9), mild leukocytosis. ECG shows sinus tachycardia with a rate of 124.  Normal axis.  There is significant artifact in the limb leads.  QRS 88, QTc 542.  Nonspecific ST changes.  T wave inversions in the lateral leads with Q waves in the anterior leads. Imaging interpreted by radiology and images personally reviewed.  CXR shows widespread groundglass airspace disease consistent with patient's positive COVID-19 result.  Impression is likely pneumonia with acute hypoxic respiratory failure. Suspect that pt's troponin elevation is likely secondary to demand in pt with viral illness and significant systemic stress. Will continue  to trend troponin. Patient will require admission for supplemental O2/respiratory management, telemetry and continued cardiac evaluation, and continued cautious rehydration given her AKI. She  will be admitted to the Internal Medicine service and remained in stable condition at time of admission.  Final Clinical Impression(s) / ED Diagnoses Final diagnoses:  COVID  Acute respiratory failure with hypoxia (HCC)      Corliss BlackerBishop, Melanee Cordial, MD 12/11/20 [id number]    Benjiman CorePickering, Nathan, MD 12/11/20 [id number]

## 2020-12-11 ENCOUNTER — Inpatient Hospital Stay (HOSPITAL_COMMUNITY): Payer: Medicare Other

## 2020-12-11 DIAGNOSIS — Z825 Family history of asthma and other chronic lower respiratory diseases: Secondary | ICD-10-CM | POA: Diagnosis not present

## 2020-12-11 DIAGNOSIS — Z283 Underimmunization status: Secondary | ICD-10-CM | POA: Diagnosis not present

## 2020-12-11 DIAGNOSIS — Z66 Do not resuscitate: Secondary | ICD-10-CM | POA: Diagnosis present

## 2020-12-11 DIAGNOSIS — Z8673 Personal history of transient ischemic attack (TIA), and cerebral infarction without residual deficits: Secondary | ICD-10-CM | POA: Diagnosis not present

## 2020-12-11 DIAGNOSIS — Z7901 Long term (current) use of anticoagulants: Secondary | ICD-10-CM | POA: Diagnosis not present

## 2020-12-11 DIAGNOSIS — Z887 Allergy status to serum and vaccine status: Secondary | ICD-10-CM | POA: Diagnosis not present

## 2020-12-11 DIAGNOSIS — K219 Gastro-esophageal reflux disease without esophagitis: Secondary | ICD-10-CM | POA: Diagnosis present

## 2020-12-11 DIAGNOSIS — Z79890 Hormone replacement therapy (postmenopausal): Secondary | ICD-10-CM | POA: Diagnosis not present

## 2020-12-11 DIAGNOSIS — M549 Dorsalgia, unspecified: Secondary | ICD-10-CM | POA: Diagnosis present

## 2020-12-11 DIAGNOSIS — I5033 Acute on chronic diastolic (congestive) heart failure: Secondary | ICD-10-CM | POA: Diagnosis present

## 2020-12-11 DIAGNOSIS — E039 Hypothyroidism, unspecified: Secondary | ICD-10-CM | POA: Diagnosis present

## 2020-12-11 DIAGNOSIS — J1282 Pneumonia due to coronavirus disease 2019: Secondary | ICD-10-CM | POA: Diagnosis present

## 2020-12-11 DIAGNOSIS — N179 Acute kidney failure, unspecified: Secondary | ICD-10-CM | POA: Diagnosis present

## 2020-12-11 DIAGNOSIS — Z86718 Personal history of other venous thrombosis and embolism: Secondary | ICD-10-CM | POA: Diagnosis not present

## 2020-12-11 DIAGNOSIS — I110 Hypertensive heart disease with heart failure: Secondary | ICD-10-CM | POA: Diagnosis present

## 2020-12-11 DIAGNOSIS — E872 Acidosis: Secondary | ICD-10-CM | POA: Diagnosis present

## 2020-12-11 DIAGNOSIS — Z91041 Radiographic dye allergy status: Secondary | ICD-10-CM | POA: Diagnosis not present

## 2020-12-11 DIAGNOSIS — R52 Pain, unspecified: Secondary | ICD-10-CM

## 2020-12-11 DIAGNOSIS — U071 COVID-19: Secondary | ICD-10-CM | POA: Diagnosis present

## 2020-12-11 DIAGNOSIS — J9601 Acute respiratory failure with hypoxia: Secondary | ICD-10-CM | POA: Diagnosis present

## 2020-12-11 DIAGNOSIS — G8929 Other chronic pain: Secondary | ICD-10-CM | POA: Diagnosis present

## 2020-12-11 DIAGNOSIS — Z888 Allergy status to other drugs, medicaments and biological substances status: Secondary | ICD-10-CM | POA: Diagnosis not present

## 2020-12-11 DIAGNOSIS — R011 Cardiac murmur, unspecified: Secondary | ICD-10-CM | POA: Diagnosis present

## 2020-12-11 DIAGNOSIS — Z79899 Other long term (current) drug therapy: Secondary | ICD-10-CM | POA: Diagnosis not present

## 2020-12-11 DIAGNOSIS — M79662 Pain in left lower leg: Secondary | ICD-10-CM | POA: Diagnosis present

## 2020-12-11 LAB — URINALYSIS, ROUTINE W REFLEX MICROSCOPIC
Bilirubin Urine: NEGATIVE
Glucose, UA: NEGATIVE mg/dL
Ketones, ur: NEGATIVE mg/dL
Leukocytes,Ua: NEGATIVE
Nitrite: NEGATIVE
Protein, ur: NEGATIVE mg/dL
Specific Gravity, Urine: 1.006 (ref 1.005–1.030)
pH: 5 (ref 5.0–8.0)

## 2020-12-11 LAB — COMPREHENSIVE METABOLIC PANEL
ALT: 16 U/L (ref 0–44)
AST: 28 U/L (ref 15–41)
Albumin: 3.2 g/dL — ABNORMAL LOW (ref 3.5–5.0)
Alkaline Phosphatase: 71 U/L (ref 38–126)
Anion gap: 13 (ref 5–15)
BUN: 12 mg/dL (ref 8–23)
CO2: 21 mmol/L — ABNORMAL LOW (ref 22–32)
Calcium: 8.4 mg/dL — ABNORMAL LOW (ref 8.9–10.3)
Chloride: 106 mmol/L (ref 98–111)
Creatinine, Ser: 1 mg/dL (ref 0.44–1.00)
GFR, Estimated: 52 mL/min — ABNORMAL LOW (ref >60)
Glucose, Bld: 147 mg/dL — ABNORMAL HIGH (ref 70–99)
Potassium: 3.7 mmol/L (ref 3.5–5.1)
Sodium: 140 mmol/L (ref 135–145)
Total Bilirubin: 1.2 mg/dL (ref 0.3–1.2)
Total Protein: 5.8 g/dL — ABNORMAL LOW (ref 6.5–8.1)

## 2020-12-11 LAB — CBG MONITORING, ED
Glucose-Capillary: 151 mg/dL — ABNORMAL HIGH (ref 70–99)
Glucose-Capillary: 151 mg/dL — ABNORMAL HIGH (ref 70–99)
Glucose-Capillary: 163 mg/dL — ABNORMAL HIGH (ref 70–99)

## 2020-12-11 LAB — TROPONIN I (HIGH SENSITIVITY)
Troponin I (High Sensitivity): 531 ng/L (ref <18)
Troponin I (High Sensitivity): 885 ng/L (ref <18)
Troponin I (High Sensitivity): 1185 ng/L (ref <18)
Troponin I (High Sensitivity): 999 ng/L (ref <18)
Troponin I (High Sensitivity): 950 ng/L (ref <18)

## 2020-12-11 LAB — D-DIMER, QUANTITATIVE: D-Dimer, Quant: 2.39 ug/mL-FEU — ABNORMAL HIGH (ref 0.00–0.50)

## 2020-12-11 LAB — FIBRINOGEN: Fibrinogen: 399 mg/dL (ref 210–475)

## 2020-12-11 LAB — FERRITIN
Ferritin: 48 ng/mL (ref 11–307)
Ferritin: 51 ng/mL (ref 11–307)

## 2020-12-11 LAB — CBC WITH DIFFERENTIAL/PLATELET
Abs Immature Granulocytes: 0.04 10*3/uL (ref 0.00–0.07)
Basophils Absolute: 0 10*3/uL (ref 0.0–0.1)
Basophils Relative: 0 %
Eosinophils Absolute: 0 10*3/uL (ref 0.0–0.5)
Eosinophils Relative: 0 %
HCT: 41.8 % (ref 36.0–46.0)
Hemoglobin: 14.4 g/dL (ref 12.0–15.0)
Immature Granulocytes: 0 %
Lymphocytes Relative: 3 %
Lymphs Abs: 0.3 10*3/uL — ABNORMAL LOW (ref 0.7–4.0)
MCH: 33.6 pg (ref 26.0–34.0)
MCHC: 34.4 g/dL (ref 30.0–36.0)
MCV: 97.7 fL (ref 80.0–100.0)
Monocytes Absolute: 0.2 10*3/uL (ref 0.1–1.0)
Monocytes Relative: 2 %
Neutro Abs: 9.7 10*3/uL — ABNORMAL HIGH (ref 1.7–7.7)
Neutrophils Relative %: 95 %
Platelets: 231 10*3/uL (ref 150–400)
RBC: 4.28 MIL/uL (ref 3.87–5.11)
RDW: 13 % (ref 11.5–15.5)
WBC: 10.2 10*3/uL (ref 4.0–10.5)
nRBC: 0 % (ref 0.0–0.2)

## 2020-12-11 LAB — PHOSPHORUS: Phosphorus: 3.6 mg/dL (ref 2.5–4.6)

## 2020-12-11 LAB — MAGNESIUM
Magnesium: 1.7 mg/dL (ref 1.7–2.4)
Magnesium: 1.9 mg/dL (ref 1.7–2.4)

## 2020-12-11 LAB — C-REACTIVE PROTEIN
CRP: 0.7 mg/dL (ref <1.0)
CRP: 0.7 mg/dL (ref <1.0)

## 2020-12-11 LAB — PROCALCITONIN: Procalcitonin: <0.1 ng/mL

## 2020-12-11 MED ORDER — DEXAMETHASONE SODIUM PHOSPHATE 10 MG/ML IJ SOLN: 6.0000 mg | Freq: Every day | INTRAMUSCULAR | Status: DC

## 2020-12-11 MED ORDER — FUROSEMIDE 10 MG/ML IJ SOLN
40.0000 mg | Freq: Every day | INTRAMUSCULAR | Status: DC
  Administered 2020-12-11: 40 mg via INTRAVENOUS
  Filled 2020-12-11: qty 4

## 2020-12-11 MED ORDER — DEXAMETHASONE SODIUM PHOSPHATE 10 MG/ML IJ SOLN
6.0000 mg | Freq: Every day | INTRAMUSCULAR | Status: DC
  Administered 2020-12-11 – 2020-12-14 (×4): 6 mg via INTRAVENOUS
  Filled 2020-12-11 (×5): qty 1

## 2020-12-11 MED ORDER — FUROSEMIDE 10 MG/ML IJ SOLN
40.0000 mg | Freq: Once | INTRAMUSCULAR | Status: AC
  Administered 2020-12-11: 40 mg via INTRAVENOUS
  Filled 2020-12-11: qty 4

## 2020-12-11 MED ORDER — FUROSEMIDE 10 MG/ML IJ SOLN: 40.0000 mg | Freq: Every day | INTRAMUSCULAR | Status: DC

## 2020-12-11 NOTE — Progress Notes (Signed)
HD#0 Subjective:  Overnight Events: trialed off BiPAP and tolerating 8L HFNC  This morning patient states she has right chest discomfort and some dyspnea. Endorses orthopnea at home and sleeps propped on several pillows. Denies prior COVID vaccines. Denies loss of smell or taste, nausea, vomiting, and abdominal pain.  Objective:  Vital signs in last 24 hours: Vitals:   12/11/20 0415 12/11/20 0430 12/11/20 0521 12/11/20 0522  BP: 131/63 (!) 142/72 (!) 146/75   Pulse: 86 86 90   Resp: (!) 24 (!) 23 (!) 22   Temp:    97.7 F (36.5 C)  TempSrc:    Oral  SpO2: 95% 95% 96%   Weight:      Height:       Supplemental O2: HFNC SpO2: 96 % O2 Flow Rate (L/min): 9 L/min FiO2 (%): 60 %  Physical Exam:  Physical Exam Musculoskeletal:     Right lower leg: Edema present.     Left lower leg: Edema present.     Comments: 2+ bilateral edema with pain to palpation  Neurological:     General: No focal deficit present.     Mental Status: She is alert.     Comments: oriented to time and place    [facility] Weights   12/10/20 2000  Weight: 68.6 kg     Intake/Output Summary (Last 24 hours) at 12/11/2020 0531 Last data filed at 12/11/2020 0432 Gross per 24 hour  Intake --  Output 1000 ml  Net -1000 ml   Net IO Since Admission: -1,000 mL [12/11/20 0531]  Pertinent Labs: CBC Latest Ref Rng & Units 12/11/2020 12/10/2020 10/19/2020  WBC 4.0 - 10.5 K/uL 10.2 11.8(H) 5.8  Hemoglobin 12.0 - 15.0 g/dL 82.9 15.7(H) 13.7  Hematocrit 36.0 - 46.0 % 41.8 48.7(H) 41.3  Platelets 150 - 400 K/uL 231 302 231    CMP Latest Ref Rng & Units 12/11/2020 12/10/2020 10/19/2020  Glucose 70 - 99 mg/dL 937(J) 696(V) 893(Y)  BUN 8 - 23 mg/dL 12 11 15   Creatinine 0.44 - 1.00 mg/dL 1.01) 7.51(W  Sodium 135 - 145 mmol/L 140 139 137  Potassium 3.5 - 5.1 mmol/L 3.7 3.7 4.2  Chloride 98 - 111 mmol/L 106 103 106  CO2 22 - 32 mmol/L 21(L) 20(L) 21(L)  Calcium 8.9 - 10.3 mg/dL 2.58) 9.0 8.9  Total Protein 6.5 -  8.1 g/dL 5.2(D) 6.6 6.8  Total Bilirubin 0.3 - 1.2 mg/dL 1.2 1.0 0.4  Alkaline Phos 38 - 126 U/L 71 85 58  AST 15 - 41 U/L 28 26 19   ALT 0 - 44 U/L 16 16 15    CRP 0.7 Ferritin 51 ddimer 2.39  Imaging: DG Chest Portable 1 View  Result Date: 12/10/2020 CLINICAL DATA:  Cough, Arrants of breath, respiratory distress, suspected COVID 19 EXAM: PORTABLE CHEST 1 VIEW COMPARISON:  06/02/2019 FINDINGS: Single frontal view of the chest demonstrates an unremarkable cardiac silhouette. There is widespread bilateral ground-glass airspace disease. May be a trace right effusion. No pneumothorax. Severe osteoarthritis left shoulder unchanged. IMPRESSION: 1. Widespread ground-glass airspace disease, which may reflect atypical infection or edema. 2. Trace right pleural effusion. Electronically Signed   By: M.D.   On: 12/10/2020 20:52    Assessment/Plan:   Active Problems:   Pneumonia due to COVID-19 virus   Patient Summary: [name] is 87yo female with hypertension, chronic diastolic heart failure, previous embolic CVA on chronic anticoagulation, hypothyroidism, GERD admitted 12/31 with COVID-19 pneumonia.  #Acute hypoxic  respiratory failure 2/2 COVID-19 pneumonia Patient on 9 L HFNC this morning saturating at well. On exam with diffuse crackles. CRP 0.7, ferritin 51, ddimer 2.39. Continue on IV steroids.  - Continue decadron (day 2/10) - Trend inflammatory markers - Incentive spirometry, flutter valve - Robitussin q4 PRN - O2 to maintain saturation >90%  #Acute on chronic heart failure exacerbation Patient endorses some orthopnea this morning. Reports good urine output this morning. On exam patient with 2+ pitting edema. Appears she remains volume overloaded will continue with IV diuretics.  - IV furosemide 40mg  - Daily weights - Strict I&O's - Tele  #Chest pain #Troponinemia Patient reports right sided chest pain this morning. Troponin continues to trend upwards at 885  this morning. EKG with mild ST elevations in V1-V3. Will get repeat EKG and discuss with cardiology if EKG shows worsening changes. - Repeat EKG - trend troponin - Tele  #L leg pain Continues to have left leg pain on palpation this morning. Patient in on chronic anticoagulation, unlikely this is a DVT.  - F/u lower extremity u/s  #Acute kidney injury #Anion gap metabolic acidosis Cr improved to 1.00 with AG of 13 likely lactic acidosis in setting of infection which is improving. Will continue diuresis and monitor kidney function. - BMP in am - Avoid nephrotoxic medications  #Hypertension IBP 146/75 this morning. Wiill hold off home lisinopril this time given AKI and current infection. Consider restarting once AKI improves. - Hold home lisinopril 5mg   DIET: HH IVF: n/a DVT PPX: Eliquis BID BOWEL: Miralax CODE: DNR  Dispo: Anticipated discharge to Home in 2 days pending medical management.  , MD 12/11/2020, 5:31 AM Pager: [phone]  Please contact the on call pager after 5 pm and on weekends at [phone].

## 2020-12-11 NOTE — H&P (Addendum)
NAME:  Raven Peterson, MRN:  [id number], DOB:  [birth year], LOS: 0 ADMISSION DATE:  12/10/2020, Primary: Patient, No Pcp Per  CHIEF COMPLAINT:  dyspnea  Medical Service: Internal Medicine Teaching Service         Attending Physician: Dr. Oswaldo Done, Marquita Palms, *    First Contact: Dr. Elaina Pattee Pager: [phone]  Second Contact: Dr. Marchia Bond Pager: [phone]       After Hours (After 5p/  First Contact Pager: [phone]  weekends / holidays): Second Contact Pager: [phone]   HISTORY OF PRESENT ILLNESS  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 History obtained via chart review and patient (difficult to understand due to BiPAP mask). [name] was in her usual state of health until yesterday when she began feeling fatigued. Later in the day she mentions she had fever, confirmed by her senior living facility. Today she began feeling dyspneic with cough around lunchtime. Later in the afternoon she started having chest pain (unclear on characterization). During interview, she mentions the pain has now relieved. Denies nausea, vomiting, abdominal pain, constipation, diarrhea, dysuria. Of note, she states multiple residents in the facility have been sick recently. She said she has had some decreased po intake, reports last "normal" meal was yesterday AM. She has not received COVID-19 vaccinations.   PCP: Patient, No Pcp Per  ED COURSE  Patient transported to [facility] via EMS. Per EMS, patient was sating in 60's upon arrival to facility, subsequently put on CPAP and given solumedrol. Upon arrival to ED, patient afebrile, RR 40, HR 120's, hypertensive 194/97. Patient transitioned to BiPAP, sating 100%. COVID-19+. IMTS consulted for admission for acute hypoxic respiratory failure 2/2 COVID-19 pneumonia.  PAST MEDICAL HISTORY  She,  has a  past medical history of Acute ischemic stroke (HCC) (10/02/2014), CAP (community acquired pneumonia) (03/17/2015), Chronic back pain, Hypertension, Inferior pubic ramus fracture, right, with routine healing, subsequent encounter, Seizure (HCC) (10/02/2014), Thyroid disease, UTI from E.coli (2018), and Vertebral compression fracture (HCC).   HOME MEDICATIONS   Prior to Admission medications   Medication Sig Start Date End Date Taking? Authorizing Provider  acetaminophen (TYLENOL) 500 MG tablet Take 500 mg by mouth 2 (two) times daily as needed for mild pain.    [provider]  apixaban (ELIQUIS) 2.5 MG TABS tablet Take 2.5 mg by mouth 2 (two) times daily. Reported on 04/03/2016 06/16/15 05/30/19  [provider]  atorvastatin (LIPITOR) 40 MG tablet Take 40 mg by mouth daily.    [provider]  benzonatate (TESSALON) 100 MG capsule Take 1 capsule (100 mg total) by mouth every 8 (eight) hours. 07/07/18   Georgetta Haber, NP  carboxymethylcellulose (REFRESH PLUS) 0.5 % SOLN Place 1 drop into both eyes every morning.    [provider]  HYDROcodone-acetaminophen (NORCO/VICODIN) 5-325 MG tablet Take 1 tablet by mouth 3 (three) times daily as needed for moderate pain. 06/03/19   Arrien, York Ram, MD  Lacosamide (VIMPAT) 100 MG TABS Take 100 mg by mouth 2 (two) times daily.  07/13/15 05/30/19  [provider]  latanoprost (XALATAN) 0.005 % ophthalmic solution Place 1 drop into both eyes at bedtime.     [provider]  levothyroxine (SYNTHROID, LEVOTHROID) 25 MCG tablet Take 25 mcg by mouth daily before breakfast.  07/13/15   [provider]  lisinopril (PRINIVIL,ZESTRIL) 5 MG tablet Take  5 mg by mouth daily.     [provider]  methocarbamol (ROBAXIN) 500 MG tablet Take 500 mg by mouth 2 (two) times daily as needed for muscle spasms.    [provider]  montelukast (SINGULAIR) 10 MG tablet Take by mouth at bedtime.     [provider]  omeprazole (PRILOSEC) 20 MG capsule Take 20 mg by mouth daily.    [provider]  polyethylene glycol (MIRALAX / GLYCOLAX) 17 g packet Take 17 g by mouth daily. 06/03/19   Arrien, Jimmy Picket, MD   ALLERGIES   Allergies as of 12/10/2020 - Review Complete 12/10/2020  Allergen Reaction Noted  . Cimetidine Other (See Comments) 01/26/2016  . Naproxen sodium Other (See Comments) 01/26/2016  . Fluzone [influenza virus vaccine]  05/30/2019  . Fosamax [alendronate sodium]  05/30/2019  . Loratadine  05/30/2019  . Iodinated diagnostic agents Nausea Only 10/03/2014   SOCIAL HISTORY  [name] lives in [facility]. Denies history of smoking, alcohol, or other drug use.   FAMILY HISTORY  Her family history includes Asthma in her father; Healthy in her mother.   REVIEW OF SYSTEMS  ROS per history of present illness.  PHYSICAL EXAMINATION  Blood pressure (!) 118/57, pulse 94, temperature 98.2 F (36.8 C), temperature source Axillary, resp. rate (!) 23, height 5\' 2"  (1.575 m), weight 68.6 kg, SpO2 98 %.    Filed Weights   12/10/20 2000  Weight: 68.6 kg   GENERAL: Elderly, no acute distress, acyanotic, on BiPAP  HENT: Normocephalic. Atraumatic. Unable to assess oral cavity due to BiPAP. EYES: Vision grossly in tact. No scleral icterus. CV: Regular rate, rhythm. No murmurs, rubs, gallops appreciated. Varicose veins appreciated on bilateral lower extremities. PULM: No increase work in breathing or use of accessory muscles. Diffuse rhonchi/rales bilaterally. No wheezing appreciated. ABD:  Soft, non-tender, non-distended. Normoactive bowel sounds. MSK: 2+ pitting edema to mid-tibia bilaterally. 1+ pitting edema further to L knee. Normal bulk, tone throughout. Tenderness to palpation L lower extremity. SKIN: Normal skin turgor. Hyperpigmentation from ankle to mid-tibia bilateral lower extremities. No ulcerations or wounds appreciated.  NEURO: Awake, alert,  oriented x4. Moving all four extremities appropriately. PSYCH: Pleasant, normal speech, mood, behavior.  SIGNIFICANT DIAGNOSTIC TESTS  ECG (initial):  Sinus tachycardia. Prolonged QT (worsened from previous study last year). Inverted T waves V5-V6. Mild ST elevations V1-V3, unchanged from previous study.  I personally reviewed patient's ECG with my interpretation as above.   CXR: No cardiomegaly. Pulmonary congestion bilaterally. Haziness throughout, no specific lobar opacity appreciated.  I personally reviewed patient's CXR with my interpretation as above.  LABS   CBC Latest Ref Rng & Units 12/10/2020 10/19/2020 06/02/2019  WBC 4.0 - 10.5 K/uL 11.8(H) 5.8 8.8  Hemoglobin 12.0 - 15.0 g/dL 15.7(H) 13.7 14.6  Hematocrit 36.0 - 46.0 % 48.7(H) 41.3 46.7(H)  Platelets 150 - 400 K/uL 302 231 219   BMP Latest Ref Rng & Units 12/10/2020 10/19/2020 06/03/2019  Glucose 70 - 99 mg/dL 301(H) 107(H) 121(H)  BUN 8 - 23 mg/dL 11 15 16   Creatinine 0.44 - 1.00 mg/dL 1.20(H) 0.81 0.93  Sodium 135 - 145 mmol/L 139 137 139  Potassium 3.5 - 5.1 mmol/L 3.7 4.2 3.5  Chloride 98 - 111 mmol/L 103 106 98  CO2 22 - 32 mmol/L 20(L) 21(L) 30  Calcium 8.9 - 10.3 mg/dL 9.0 8.9 8.2(L)    CONSULTS  none  ASSESSMENT  Ms. 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  PLAN  Active Problems:   Pneumonia due to COVID-19 virus  #Acute hypoxic respiratory failure 2/2 COVID-19 pneumonia Patient presenting with acute dyspnea, cough that began today. In the ED, patient required BiPAP for appropriate oxygenation and started on IV solumedrol. Upon our interview, patient appeared comfortable, no increased work of breathing. CXR with diffuse haziness and pulmonary congestion. Labs revealed leukocytosis, normal LFT's, normal CRP/ferritin/fibrinogen, elevated D-dimer. In addition to  COVID-19, respiratory status also compromised due to hypervolemic state on exam. For her viral illness, will begin antiviral therapy and continue IV steroids.  - Start remdesivir (day 1/5) - Start Decadron (day 1/10) - Trend inflammatory markers - Incentive spirometry, flutter valve - Robitussin q4 PRN - Zinc sulfate 220mg  qd  - CBG monitoring q6h  #Acute on chronic heart failure exacerbation Patient has history of chronic diastolic heart failure. Previously admitted June 2020 with acute hypoxic respiratory failure due to heart failure exacerbation with subsequent improvement with IV furosemide. TTE at that time revealed normal EF, diastolic dysfunction. On presentation today, exam revealed pitting edema bilaterally with rales appreciated. CXR also with pulmonary congestion. BNP elevated at 700. Although patient appears to have a degree of venous insufficiency, noted by varicose veins and venous dermatitis stasis, most likely patient experiencing exacerbation with concomitant COVID-19 infection. Will start diuresis and monitor electrolytes. Plan to give one dose lasix this evening, can re-assess volume status in AM for further needs. - IV furosemide 40mg  - F/u BMP, Mg - Daily weights - Strict I&O's - Tele  #Chest pain #Troponinemia Patient presenting with a few hours of chest pain. Initial ECG without acute ischemic changes. During interview, patient denies current chest pain. Troponin 120>250>530. Given continued rise in troponin, repeat ECG ordered. On this study, ECG revealed normal sinus rhythm, borderline left axis deviation with possible worsening of ST elevations in anterior leads. Given patient is at high risk, discussed with cardiology. As patient is pain free at this time and ST changes appear minimal on ECG, will hold off further work-up. Will monitor patient for further symptoms.  #L leg pain Upon examination of volume status, patient verbalized that her left leg hurts when  palpated. She said this problem is new within the past few days. Denies hx previous DVT's. She is already on chronic anticoagulation due to previous stroke, unlikely DVT. However, given patient's COVID-19 diagnosis and mildly elevated D-dimer, will obtain ultrasound. - F/u lower extremity u/s  #Acute kidney injury #Anion gap metabolic acidosis On initial lab work, sCr 1.20 (baseline ~0.8). With patient's heart failure exacerbation, likely 2/2 decreased EABV. In addition, lab work revealed bicarb 21, AG 16. Possibly lactic acidosis v starvation ketosis. Will continue to monitor closely, especially with diuretics. - Trend BMP - Strict I&O's - Avoid nephrotoxic medications  #Hypertension On arrival, patient hypertensive to 194/97. Throughout ED, blood pressure normalized. Will hold off home lisinopril this time given AKI and current infection. Will continue to monitor. - Hold home lisinopril 5mg   #Hx CVA on chronic anticoagulation Patient previously had cardio-embolic stroke in 2015. She has been on chronic anticoagulation without known difficulties. Will continue while inpatient. - Continue home eliquis 2.5mg  BID  #GERD Patient with chronic acid reflux, controlled with PPI. - Continue home pantoprazole 40mg  qd  #Hypothyroidism Last TSH 2.13 in June 2020. Will continue with home medication, can consider r/p TSH. - Continue home levothyroxine  BEST PRACTICE  DIET: Thin  IVF: n/a DVT PPX: Eliquis BID BOWEL: Miralax CODE: DNR FAM COM: Patient reports her daughter, Gus Puma ([phone]), was not aware of patient's illness and wished for me to contact her. Called Ms. Reynolds and explained Ms. Bicking's illness. Stated that we will call her tomorrow for further updates.  DISPO: Admit patient to Inpatient with expected length of stay greater than 2 midnights.  Evlyn Kanner, MD Internal Medicine Resident PGY-1 PAGER: [phone] 12/10/2020 10:19 PM  If after hours  (below), please contact on-call pager: [phone] 5PM-7AM Monday-Friday 1PM-7AM Saturday-Sunday

## 2020-12-11 NOTE — ED Notes (Signed)
Date and time results received: 12/11/20 1500 (use smartphrase ".now" to insert current time)  Test: troponin Critical Value: 1185  Name of Provider Notified: Dr. Erlinda Hong  Orders Received? Or Actions Taken?: orders to follow

## 2020-12-11 NOTE — ED Notes (Signed)
Patient daughter Roseanna Rainbow called for an update, [phone]

## 2020-12-11 NOTE — Progress Notes (Signed)
Left lower extremity venous duplex has been completed. Preliminary results can be found in CV Proc through chart review.   12/11/20 3:17 PM Olen Cordial RVT

## 2020-12-12 DIAGNOSIS — J1282 Pneumonia due to coronavirus disease 2019: Secondary | ICD-10-CM | POA: Diagnosis not present

## 2020-12-12 DIAGNOSIS — U071 COVID-19: Secondary | ICD-10-CM | POA: Diagnosis not present

## 2020-12-12 LAB — COMPREHENSIVE METABOLIC PANEL
ALT: 20 U/L (ref 0–44)
AST: 25 U/L (ref 15–41)
Albumin: 3.3 g/dL — ABNORMAL LOW (ref 3.5–5.0)
Alkaline Phosphatase: 65 U/L (ref 38–126)
Anion gap: 13 (ref 5–15)
BUN: 20 mg/dL (ref 8–23)
CO2: 26 mmol/L (ref 22–32)
Calcium: 8.5 mg/dL — ABNORMAL LOW (ref 8.9–10.3)
Chloride: 101 mmol/L (ref 98–111)
Creatinine, Ser: 1.05 mg/dL — ABNORMAL HIGH (ref 0.44–1.00)
GFR, Estimated: 49 mL/min — ABNORMAL LOW (ref >60)
Glucose, Bld: 127 mg/dL — ABNORMAL HIGH (ref 70–99)
Potassium: 3.5 mmol/L (ref 3.5–5.1)
Sodium: 140 mmol/L (ref 135–145)
Total Bilirubin: 0.8 mg/dL (ref 0.3–1.2)
Total Protein: 6.2 g/dL — ABNORMAL LOW (ref 6.5–8.1)

## 2020-12-12 LAB — CBC WITH DIFFERENTIAL/PLATELET
Basophils Absolute: 0 10*3/uL (ref 0.0–0.1)
Basophils Relative: 0 %
Eosinophils Absolute: 0 10*3/uL (ref 0.0–0.5)
Eosinophils Relative: 0 %
HCT: 60.1 % — ABNORMAL HIGH (ref 36.0–46.0)
Hemoglobin: 20.6 g/dL — ABNORMAL HIGH (ref 12.0–15.0)
Lymphocytes Relative: 9 %
Lymphs Abs: 0.6 10*3/uL — ABNORMAL LOW (ref 0.7–4.0)
MCH: 33.2 pg (ref 26.0–34.0)
MCHC: 34.3 g/dL (ref 30.0–36.0)
MCV: 96.9 fL (ref 80.0–100.0)
Monocytes Absolute: 0.4 10*3/uL (ref 0.1–1.0)
Monocytes Relative: 6 %
Neutro Abs: 6.3 10*3/uL (ref 1.7–7.7)
Neutrophils Relative %: 85 %
Platelets: 102 10*3/uL — ABNORMAL LOW (ref 150–400)
RBC: 6.2 MIL/uL — ABNORMAL HIGH (ref 3.87–5.11)
RDW: 13.4 % (ref 11.5–15.5)
WBC: 7.5 10*3/uL (ref 4.0–10.5)

## 2020-12-12 LAB — PHOSPHORUS: Phosphorus: 3.6 mg/dL (ref 2.5–4.6)

## 2020-12-12 LAB — FERRITIN: Ferritin: 58 ng/mL (ref 11–307)

## 2020-12-12 LAB — C-REACTIVE PROTEIN: CRP: 2.6 mg/dL — ABNORMAL HIGH (ref <1.0)

## 2020-12-12 LAB — CBG MONITORING, ED
Glucose-Capillary: 118 mg/dL — ABNORMAL HIGH (ref 70–99)
Glucose-Capillary: 125 mg/dL — ABNORMAL HIGH (ref 70–99)

## 2020-12-12 LAB — MAGNESIUM: Magnesium: 1.7 mg/dL (ref 1.7–2.4)

## 2020-12-12 MED ORDER — LISINOPRIL 5 MG PO TABS
5.0000 mg | ORAL_TABLET | Freq: Every day | ORAL | Status: DC
  Administered 2020-12-12 – 2020-12-14 (×3): 5 mg via ORAL
  Filled 2020-12-12 (×3): qty 1

## 2020-12-12 NOTE — ED Notes (Signed)
Pt's oxygen was out of nares and laying beside pt when nurse entered room. Pt's O2 sat remained around 94-96% on RA. O2 turned off at this time.

## 2020-12-12 NOTE — ED Notes (Signed)
+  tele Breakfast Ordered 

## 2020-12-12 NOTE — ED Notes (Signed)
Raven Peterson -- [phone]

## 2020-12-12 NOTE — ED Notes (Signed)
Dr's at bedside-- pt is alert/oriented to self-and place- not time. Reoriented and day/date placed on dry write board.  speech clear--

## 2020-12-12 NOTE — Progress Notes (Addendum)
HD 3 Subjective:  Overnight Events: None   Raven Peterson was seen and evaluated at bedside. She states that she is doing better than yesterday. She has no complaints this AM. Her chest pain has subsided. She has no dyspnea, SHOB, abdominal pain. We discussed her plan for today. She is agreeable. All questions and concerns were addressed.   Objective:  Vital signs in last 24 hours: Vitals:   12/12/20 0530 12/12/20 0600 12/12/20 0630 12/12/20 0700  BP: (!) 157/69 (!) 154/91 (!) 152/73 137/62  Pulse: 87 87 89 91  Resp: (!) 23 (!) 23 20 (!) 23  Temp:    (!) 97.4 F (36.3 C)  TempSrc:    Oral  SpO2: 95% 94% 94% 93%  Weight:      Height:       Supplemental O2: Room Air  Physical Exam:  Physical Exam Vitals and nursing note reviewed.  Constitutional:      General: She is not in acute distress.    Appearance: She is well-developed. She is not ill-appearing, toxic-appearing or diaphoretic.  HENT:     Head: Normocephalic and atraumatic.  Cardiovascular:     Rate and Rhythm: Normal rate and regular rhythm.     Pulses: Normal pulses.     Heart sounds: Murmur (2/6 systolic) heard.      Comments: JVD at level of clavicle Pulmonary:     Effort: Pulmonary effort is normal.     Comments: Decreased breath sounds Abdominal:     General: Abdomen is flat. There is no distension.     Palpations: Abdomen is soft.     Tenderness: There is no abdominal tenderness. There is no guarding or rebound.  Musculoskeletal:     Right lower leg: No edema.     Left lower leg: Tenderness present. No edema.  Skin:    Comments: Cool lower extremities  Neurological:     General: No focal deficit present.     Mental Status: She is alert and oriented to person, place, and time. Mental status is at baseline.  Psychiatric:        Mood and Affect: Mood normal.        Behavior: Behavior normal.    Filed Weights   12/10/20 2000  Weight: 68.6 kg     Intake/Output Summary (Last 24 hours) at 12/12/2020  0919 Last data filed at 12/11/2020 1600 Gross per 24 hour  Intake --  Output 800 ml  Net -800 ml   Net IO Since Admission: -1,800 mL [12/12/20 0919]  Pertinent Labs: CBC Latest Ref Rng & Units 12/12/2020 12/11/2020 12/10/2020  WBC 4.0 - 10.5 K/uL 7.5 10.2 11.8(H)  Hemoglobin 12.0 - 15.0 g/dL 20.6(H) 14.4 15.7(H)  Hematocrit 36.0 - 46.0 % 60.1(H) 41.8 48.7(H)  Platelets 150 - 400 K/uL 102(L) 231 302    CMP Latest Ref Rng & Units 12/12/2020 12/11/2020 12/10/2020  Glucose 70 - 99 mg/dL 756(E) 332(R) 518(A)  BUN 8 - 23 mg/dL 20 12 11   Creatinine 0.44 - 1.00 mg/dL ) 4.16(S 0.63)  Sodium 135 - 145 mmol/L 140 140 139  Potassium 3.5 - 5.1 mmol/L 3.5 3.7 3.7  Chloride 98 - 111 mmol/L 101 106 103  CO2 22 - 32 mmol/L 26 21(L) 20(L)  Calcium 8.9 - 10.3 mg/dL 0.16(W) 1.0(X) 9.0  Total Protein 6.5 - 8.1 g/dL 6.2(L) 5.8(L) 6.6  Total Bilirubin 0.3 - 1.2 mg/dL 0.8 1.2 1.0  Alkaline Phos 38 - 126 U/L 65 71 85  AST 15 -  41 U/L 25 28 26   ALT 0 - 44 U/L 20 16 16     Imaging: VAS [place] LOWER EXTREMITY VENOUS (DVT)  Result Date: 12/11/2020  Lower Venous DVT Study Indications: Pain.  Risk Factors: COVID 19 positive. Limitations: Poor ultrasound/tissue interface and patient positioning. Comparison Study: No prior studies. Performing Technologist: Oliver Hum RVT  Examination Guidelines: A complete evaluation includes B-mode imaging, spectral Doppler, color Doppler, and power Doppler as needed of all accessible portions of each vessel. Bilateral testing is considered an integral part of a complete examination. Limited examinations for reoccurring indications may be performed as noted. The reflux portion of the exam is performed with the patient in reverse Trendelenburg.  +-----+---------------+---------+-----------+----------+--------------+ RIGHTCompressibilityPhasicitySpontaneityPropertiesThrombus Aging +-----+---------------+---------+-----------+----------+--------------+ CFV  Full           Yes       Yes                                 +-----+---------------+---------+-----------+----------+--------------+   +---------+---------------+---------+-----------+----------+--------------+ LEFT     CompressibilityPhasicitySpontaneityPropertiesThrombus Aging +---------+---------------+---------+-----------+----------+--------------+ CFV      Full           Yes      Yes                                 +---------+---------------+---------+-----------+----------+--------------+ SFJ      Full                                                        +---------+---------------+---------+-----------+----------+--------------+ FV Prox  Full                                                        +---------+---------------+---------+-----------+----------+--------------+ FV Mid   Full                                                        +---------+---------------+---------+-----------+----------+--------------+ FV DistalFull           Yes      Yes                                 +---------+---------------+---------+-----------+----------+--------------+ PFV      Full                                                        +---------+---------------+---------+-----------+----------+--------------+ POP      Full           Yes      Yes                                 +---------+---------------+---------+-----------+----------+--------------+  PTV      Full                                                        +---------+---------------+---------+-----------+----------+--------------+ PERO     Full                                                        +---------+---------------+---------+-----------+----------+--------------+     Summary: RIGHT: - No evidence of common femoral vein obstruction.  LEFT: - There is no evidence of deep vein thrombosis in the lower extremity. However, portions of this examination were limited- see technologist comments above.  -  No cystic structure found in the popliteal fossa.  *See table(s) above for measurements and observations.    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  #Acute hypoxic respiratory failure 2/2 COVID-19 pneumonia Patient on room air since this am, saturating well. Decreased breath sounds on exam. CRP 2.6, ferritin 58. Continue IV steroids. Patient's living facility [facility] has no COVID requirements in terms of accepting patient back. Plan to discharge back to facility tomorrow - continue decadron (day 3/10) - trend inflammatory markers - continue incentive spirometry, flutter valve - robitussin q4 PRN - O2 to maintain saturation >90%, PT to evaluate today. Will assess if she desats while ambulating.   #Acute on chronic heart failure exacerbation Denies shortness of breath this am. JVD at level of clavicle. No lower extremity edema appreciated. Appears euvolemic on exam today, pending weight at this time. Uncertain of dry weight, admission weight of 68.6 kg.  - discontinue lasix today - last weight two days ago, pending weight today - strict I&O's  #Chest pain #Troponinemia #Systolic Murmur Denies chest pain or SOB this morning. Troponin I peaked at 1185 and trended downward, last trop of 950. EKG with no significant changes, T wave inversion in V4-V6 appears more prominent. Patient without chest pain and suspect this is secondary to COVID-19 infection. Systolic murmur documented on prior cardiology notes, though to be secondary to outflow obstruction. - will continue to monitor for  chest pain, shortness of breath. If occurs, will consider repeat EKG and troponin - continue tele monitoring  #L leg pain Continues to have left leg pain on palpation this morning. LLE [place] negative for DVT 12/12/19. Patient in on chronic anticoagulation, unlikely this is a DVT.  - continue to monitor for worsening pain  #Acute kidney injury #Anion gap metabolic acidosis Cr 1.00> 1.05 with AG of 13. Suspect lactic acidosis in setting of infection . Appears euvolemic on physical exam, will discontinue diuretics at this time.  - repeat BMP in the am - avoid nephrotoxic medications  #Hypertension IBP 153/67 this morning. AKi improving overall, will restart lisinopril today - lisinopril 5mg  daily  Diet: Heart Healthy IVF: None,None VTE: Eliquis BID Code: DNR  PT/OT recs: Pending  Updated patient's daughter Ms. Reynolds of patient's current status. All questions answered and concerns addressed.   Dispo: Anticipated discharge back to [facility] 1-2 days  Thalia Bloodgood DO Internal Medicine Resident PGY-1 Pager [phone] Please contact the on call pager after 5 pm and on weekends at [phone].

## 2020-12-12 NOTE — Progress Notes (Signed)
Pt is currently on RA w/no distress. Pt respiratory status stable w/no distress noted at this time. RT will discontinue order for BIPAP at this time. RT will continue to monitor.

## 2020-12-13 ENCOUNTER — Encounter (HOSPITAL_COMMUNITY): Payer: Self-pay | Admitting: Student in an Organized Health Care Education/Training Program

## 2020-12-13 DIAGNOSIS — U071 COVID-19: Secondary | ICD-10-CM | POA: Diagnosis not present

## 2020-12-13 DIAGNOSIS — J1282 Pneumonia due to coronavirus disease 2019: Secondary | ICD-10-CM | POA: Diagnosis not present

## 2020-12-13 LAB — COMPREHENSIVE METABOLIC PANEL
ALT: 16 U/L (ref 0–44)
AST: 20 U/L (ref 15–41)
Albumin: 3.2 g/dL — ABNORMAL LOW (ref 3.5–5.0)
Alkaline Phosphatase: 56 U/L (ref 38–126)
Anion gap: 12 (ref 5–15)
BUN: 23 mg/dL (ref 8–23)
CO2: 26 mmol/L (ref 22–32)
Calcium: 8.4 mg/dL — ABNORMAL LOW (ref 8.9–10.3)
Chloride: 98 mmol/L (ref 98–111)
Creatinine, Ser: 0.88 mg/dL (ref 0.44–1.00)
GFR, Estimated: >60 mL/min (ref >60)
Glucose, Bld: 121 mg/dL — ABNORMAL HIGH (ref 70–99)
Potassium: 3.5 mmol/L (ref 3.5–5.1)
Sodium: 136 mmol/L (ref 135–145)
Total Bilirubin: 0.9 mg/dL (ref 0.3–1.2)
Total Protein: 5.9 g/dL — ABNORMAL LOW (ref 6.5–8.1)

## 2020-12-13 LAB — CBC WITH DIFFERENTIAL/PLATELET
Abs Immature Granulocytes: 0.05 K/uL (ref 0.00–0.07)
Basophils Absolute: 0 K/uL (ref 0.0–0.1)
Basophils Relative: 0 %
Eosinophils Absolute: 0 K/uL (ref 0.0–0.5)
Eosinophils Relative: 0 %
HCT: 40.7 % (ref 36.0–46.0)
Hemoglobin: 13.6 g/dL (ref 12.0–15.0)
Immature Granulocytes: 1 %
Lymphocytes Relative: 10 %
Lymphs Abs: 0.9 K/uL (ref 0.7–4.0)
MCH: 32.1 pg (ref 26.0–34.0)
MCHC: 33.4 g/dL (ref 30.0–36.0)
MCV: 96 fL (ref 80.0–100.0)
Monocytes Absolute: 0.7 K/uL (ref 0.1–1.0)
Monocytes Relative: 9 %
Neutro Abs: 6.6 K/uL (ref 1.7–7.7)
Neutrophils Relative %: 80 %
Platelets: 226 K/uL (ref 150–400)
RBC: 4.24 MIL/uL (ref 3.87–5.11)
RDW: 13 % (ref 11.5–15.5)
WBC: 8.3 K/uL (ref 4.0–10.5)
nRBC: 0 % (ref 0.0–0.2)

## 2020-12-13 LAB — FERRITIN: Ferritin: 60 ng/mL (ref 11–307)

## 2020-12-13 LAB — GLUCOSE, CAPILLARY
Glucose-Capillary: 128 mg/dL — ABNORMAL HIGH (ref 70–99)
Glucose-Capillary: 145 mg/dL — ABNORMAL HIGH (ref 70–99)
Glucose-Capillary: 154 mg/dL — ABNORMAL HIGH (ref 70–99)
Glucose-Capillary: 237 mg/dL — ABNORMAL HIGH (ref 70–99)

## 2020-12-13 LAB — PHOSPHORUS: Phosphorus: 3.5 mg/dL (ref 2.5–4.6)

## 2020-12-13 LAB — MAGNESIUM: Magnesium: 1.9 mg/dL (ref 1.7–2.4)

## 2020-12-13 LAB — C-REACTIVE PROTEIN: CRP: 5.6 mg/dL — ABNORMAL HIGH

## 2020-12-13 NOTE — Evaluation (Signed)
Occupational Therapy Evaluation Patient Details Name: Raven Peterson MRN: [id number] DOB: 1922-09-04 Today's Date: 12/13/2020    History of Present Illness Pt is a 87 year old person living with chronic heart failure with preserved ejection fraction, anticoagulation due to a prior embolic cerebral infarction, resident at [facility] living, admitted to our service with acute hypoxic respiratory failure due to COVID-19 pneumonia   Clinical Impression   Pt PTA: Pt from ALF and was nearly independent with ADL; assist for bathing/dressing and ambulating with RW. Pt currently minguardA for bed mobility; minA for transfers and minA for ADL tasks. Pt on RA >90%. Pt does not require continued OT skilled services for ADL, mobility and safety. OT following acutely.      Follow Up Recommendations  SNF;Supervision/Assistance - 24 hour (hope to progress to ALF with [facility])    Equipment Recommendations  3 in 1 bedside commode    Recommendations for Other Services       Precautions / Restrictions Precautions Precautions: Fall Restrictions Weight Bearing Restrictions: No      Mobility Bed Mobility Overal bed mobility: Needs Assistance Bed Mobility: Supine to Sit;Sit to Supine     Supine to sit: Min guard Sit to supine: Supervision   General bed mobility comments: assist for hand placement; minA to scoot towards [facility]    Transfers Overall transfer level: Needs assistance Equipment used: Rolling walker (2 wheeled) Transfers: Sit to/from [employer] Sit to Stand: Min guard Stand pivot transfers: Min assist       General transfer comment: minA RW for stability    Balance Overall balance assessment: Needs assistance Sitting-balance support: Bilateral upper extremity supported Sitting balance-Leahy Scale: Fair     Standing balance support: Bilateral upper extremity supported Standing balance-Leahy Scale: Poor Standing balance comment: with RW                            ADL either performed or assessed with clinical judgement   ADL Overall ADL's : Needs assistance/impaired Eating/Feeding: Set up;Sitting   Grooming: Supervision/safety Grooming Details (indicate cue type and reason): stood x2 mins for hand washing Upper Body Bathing: Set up   Lower Body Bathing: Min guard;Sitting/lateral leans;Sit to/from stand   Upper Body Dressing : Set up;Sitting   Lower Body Dressing: Min guard;Sitting/lateral leans;Sit to/from stand;Cueing for safety Lower Body Dressing Details (indicate cue type and reason): donning slip on shoes at EOB Toilet Transfer: Minimal assistance;Ambulation;BSC;Grab bars;RW Statistician Details (indicate cue type and reason): minA for descent and power up to avoid plopping to assist rising Toileting- Clothing Manipulation and Hygiene: Minimal assistance;Sitting/lateral lean;Sit to/from stand Toileting - Clothing Manipulation Details (indicate cue type and reason): assist to finish cleaning periarea     Functional mobility during ADLs: Minimal assistance;Rolling walker;Cueing for safety General ADL Comments: Pt limited by decreased strength, decreased activity tolerance and decreased ability to care for self. Pt reports living in ALF where she has assist for bathing/dressing and medications.  Pt reports "I feel weak and I would need more assist at home."     Vision Baseline Vision/History: No visual deficits Patient Visual Report: No change from baseline Vision Assessment?: No apparent visual deficits     Perception     Praxis      Pertinent Vitals/Pain       Hand Dominance Right   Extremity/Trunk Assessment Upper Extremity Assessment Upper Extremity Assessment: Generalized weakness;RUE deficits/detail;LUE deficits/detail RUE Deficits / Details: AROM 80* FF RUE Coordination:  decreased gross motor LUE Deficits / Details: AROM 80* FF LUE Coordination: decreased gross motor   Lower Extremity Assessment Lower  Extremity Assessment: Generalized weakness   Cervical / Trunk Assessment Cervical / Trunk Assessment: Kyphotic   Communication Communication Communication: HOH   Cognition Arousal/Alertness: Awake/alert Behavior During Therapy: WFL for tasks assessed/performed Overall Cognitive Status: Within Functional Limits for tasks assessed                                     General Comments  O2 >90% on RA;    Exercises     Shoulder Instructions      Home Living Family/patient expects to be discharged to:: Assisted living Living Arrangements: Alone Available Help at Discharge: Personal care attendant;Available PRN/intermittently Type of Home: Assisted living Home Access: Level entry     Home Layout: One level     Bathroom Shower/Tub: Occupational psychologist: Standard     Home Equipment: Environmental consultant - 2 wheels;Shower seat;Hand held shower head   Additional Comments: [facility]      Prior Functioning/Environment Level of Independence: Needs assistance  Gait / Transfers Assistance Needed: RW for mobility; ADL's / Homemaking Assistance Needed: Assist for bathe/dressing; iADLs provided for pt   Comments: 1 daughter in area        OT Problem List: Decreased strength;Decreased activity tolerance;Impaired balance (sitting and/or standing);Decreased safety awareness;Cardiopulmonary status limiting activity      OT Treatment/Interventions: Self-care/ADL training;Therapeutic exercise;Energy conservation;DME and/or AE instruction;Therapeutic activities;Patient/family education;Balance training    OT Goals(Current goals can be found in the care plan section) Acute Rehab OT Goals Patient Stated Goal: to get stronger before I go home OT Goal Formulation: With patient Time For Goal Achievement: 12/27/20 Potential to Achieve Goals: Good ADL Goals Pt Will Perform Lower Body Dressing: with modified independence;sitting/lateral leans;sit to/from stand Additional ADL  Goal #1: Pt will state/utilize 3 energy conservation techniques in order to increase independence with ADL Additional ADL Goal #2: Pt will tolerate standing x5 mins for OOB ADL tasks in order to continue to assess safety and endurance for ADL.  OT Frequency: Min 2X/week   Barriers to D/C: Decreased caregiver support  from ALF, btu requires additional physical support       Co-evaluation PT/OT/SLP Co-Evaluation/Treatment: Yes Reason for Co-Treatment: Complexity of the patient's impairments (multi-system involvement);For patient/therapist safety   OT goals addressed during session: ADL's and self-care      AM-PAC OT "6 Clicks" Daily Activity     Outcome Measure Help from another person eating meals?: None Help from another person taking care of personal grooming?: A Little Help from another person toileting, which includes using toliet, bedpan, or urinal?: A Little Help from another person bathing (including washing, rinsing, drying)?: A Little Help from another person to put on and taking off regular upper body clothing?: A Little Help from another person to put on and taking off regular lower body clothing?: A Little 6 Click Score: 19   End of Session Equipment Utilized During Treatment: Rolling walker Nurse Communication: Mobility status  Activity Tolerance: Patient limited by fatigue Patient left: in bed;with call bell/phone within reach  OT Visit Diagnosis: Unsteadiness on feet (R26.81);Muscle weakness (generalized) (M62.81)                Time: 7026-3785 OT Time Calculation (min): 34 min Charges:  OT General Charges $OT Visit: 1 Visit OT Evaluation $OT Eval Moderate  Complexity: 1 Mod  Flora Lipps, OTR/L Acute Rehabilitation Services Pager: [phone] Office: [phone]   Flora Lipps 12/13/2020, 4:35 PM

## 2020-12-13 NOTE — Plan of Care (Signed)
  Problem: Education: Goal: Knowledge of risk factors and measures for prevention of condition will improve Outcome: Progressing   

## 2020-12-13 NOTE — Evaluation (Signed)
Physical Therapy Evaluation Patient Details Name: Raven Peterson MRN: [id number] DOB: 14-May-1922 Today's Date: 12/13/2020   History of Present Illness  Pt is a 87 year old person living with chronic heart failure with preserved ejection fraction, anticoagulation due to a prior embolic cerebral infarction, resident at [facility], admitted to our service with acute hypoxic respiratory failure due to COVID-19 pneumonia  Clinical Impression  Pt admitted with/for COVID PNA.  Pt mobilizing at a min guard or better level in a homelike environment.  Pt currently limited functionally due to the problems listed below.  (see problems list.)  Pt will benefit from PT to maximize function and safety to be able to get home safely with available assist.     Follow Up Recommendations No PT follow up;Other (comment) (pt could benefit from HHPT, but declines.)    Equipment Recommendations  None recommended by PT    Recommendations for Other Services       Precautions / Restrictions Precautions Precautions: Fall Restrictions Weight Bearing Restrictions: No      Mobility  Bed Mobility Overal bed mobility: Needs Assistance Bed Mobility: Supine to Sit;Sit to Supine     Supine to sit: Min guard Sit to supine: Supervision   General bed mobility comments: assist for hand placement; minA to scoot towards [facility]    Transfers Overall transfer level: Needs assistance Equipment used: Rolling walker (2 wheeled) Transfers: Sit to/from [employer] Sit to Stand: Min guard Stand pivot transfers: Min assist       General transfer comment: minA RW for stability  Ambulation/Gait Ambulation/Gait assistance: Min guard Gait Distance (Feet): 15 Feet (x2 to/from the bathroom) Assistive device: Rolling walker (2 wheeled) Gait Pattern/deviations: Step-through pattern Gait velocity: slower   General Gait Details: pt maneuvered the RW well in the confined space of the room and bathroom.   Generally steady in a home-like environment.  Stairs            Wheelchair Mobility    Modified Rankin (Stroke Patients Only)       Balance Overall balance assessment: Needs assistance Sitting-balance support: Bilateral upper extremity supported Sitting balance-Leahy Scale: Fair     Standing balance support: Bilateral upper extremity supported Standing balance-Leahy Scale: Poor Standing balance comment: reliant on the RW though not on external assist                             Pertinent Vitals/Pain      Home Living Family/patient expects to be discharged to:: Assisted living Living Arrangements: Alone Available Help at Discharge: Personal care attendant;Available PRN/intermittently Type of Home: Assisted living Home Access: Level entry     Home Layout: One level Home Equipment: Walker - 2 wheels;Shower seat;Hand held shower head Additional Comments: [facility]    Prior Function Level of Independence: Needs assistance   Gait / Transfers Assistance Needed: RW for mobility;  ADL's / Homemaking Assistance Needed: Assist for bathe/dressing; iADLs provided for pt  Comments: 1 daughter in area     Hand Dominance   Dominant Hand: Right    Extremity/Trunk Assessment   Upper Extremity Assessment Upper Extremity Assessment: Defer to OT evaluation RUE Deficits / Details: AROM 80* FF RUE Coordination: decreased gross motor LUE Deficits / Details: AROM 80* FF LUE Coordination: decreased gross motor    Lower Extremity Assessment Lower Extremity Assessment: Generalized weakness    Cervical / Trunk Assessment Cervical / Trunk Assessment: Kyphotic  Communication   Communication:  HOH  Cognition Arousal/Alertness: Awake/alert Behavior During Therapy: WFL for tasks assessed/performed Overall Cognitive Status: Within Functional Limits for tasks assessed                                        General Comments General comments (skin  integrity, edema, etc.): SpO2 >90% on RA    Exercises     Assessment/Plan    PT Assessment Patient needs continued PT services  PT Problem List Decreased strength;Decreased activity tolerance;Decreased balance;Decreased mobility;Decreased knowledge of use of DME;Cardiopulmonary status limiting activity       PT Treatment Interventions DME instruction;Gait training;Functional mobility training;Therapeutic activities;Patient/family education    PT Goals (Current goals can be found in the Care Plan section)  Acute Rehab PT Goals Patient Stated Goal: to get stronger before I go home PT Goal Formulation: With patient Time For Goal Achievement: 12/27/20 Potential to Achieve Goals: Good    Frequency Min 3X/week   Barriers to discharge   pt has no consistent assist/supervision and limited facility assist per pt.    Co-evaluation PT/OT/SLP Co-Evaluation/Treatment: Yes Reason for Co-Treatment: Complexity of the patient's impairments (multi-system involvement) PT goals addressed during session: Mobility/safety with mobility OT goals addressed during session: ADL's and self-care       AM-PAC PT "6 Clicks" Mobility  Outcome Measure Help needed turning from your back to your side while in a flat bed without using bedrails?: A Little Help needed moving from lying on your back to sitting on the side of a flat bed without using bedrails?: A Little Help needed moving to and from a bed to a chair (including a wheelchair)?: A Little Help needed standing up from a chair using your arms (e.g., wheelchair or bedside chair)?: A Little Help needed to walk in hospital room?: A Little Help needed climbing 3-5 steps with a railing? : A Lot 6 Click Score: 17    End of Session     Patient left: in bed;with call bell/phone within reach Nurse Communication: Mobility status PT Visit Diagnosis: Muscle weakness (generalized) (M62.81);Difficulty in walking, not elsewhere classified (R26.2)    Time:  5465-0354 PT Time Calculation (min) (ACUTE ONLY): 34 min   Charges:   PT Evaluation $PT Eval Moderate Complexity: 1 Mod          12/13/2020  Jacinto Halim., PT Acute Rehabilitation Services [phone]  (pager) [phone]  (office)  Eliseo Gum Kendell Sagraves 12/13/2020, 5:33 PM

## 2020-12-13 NOTE — TOC Progression Note (Signed)
Transition of Care [facility]) - Progression Note    Patient Details  Name: Raven Peterson MRN: [id number] Date of Birth: [birth year]  [facility] CM/SW Contact  Beckie Busing, RN Phone Number: [phone]   12/13/2020, 12:09 PM  Clinical Narrative:    CM following patient that arrived from [facility]. CM attempted to call [facility] to verify that patient will be ok to return. CM has called twice with no answer. CM attempted to call daughter to verify that patient will be returning to [place]. Message has been left for daughter due to no answer. Patient does not answer phone. CM will continue to reach out to facility.         Expected Discharge Plan and Services                                                 Social Determinants of Health (SDOH) Interventions    Readmission Risk Interventions No flowsheet data found.

## 2020-12-13 NOTE — Progress Notes (Signed)
HD 3 Subjective:  Overnight Events: None   Raven Peterson was seen and evaluated at bedside. She is doing well, denies shortness of breath or chest pain. She endorses some difficulty rising and walking to the bathroom and states she may need assistance with this in the future. No other concerns at the time of my examination  Objective:  Vital signs in last 24 hours: Vitals:   12/12/20 1436 12/12/20 2047 12/13/20 0405 12/13/20 0500  BP: (!) 143/62 119/66 (!) 148/80   Pulse: (!) 106 94 88   Resp:  18 18   Temp: 98.6 F (37 C) 98.6 F (37 C) 97.9 F (36.6 C)   TempSrc:  Oral Oral   SpO2: 95% 100% 94%   Weight:    67.5 kg  Height:       Supplemental O2: Room Air  Physical Exam:  Physical Exam Vitals and nursing note reviewed.  Constitutional:      General: She is not in acute distress.    Appearance: She is well-developed. She is not ill-appearing, toxic-appearing or diaphoretic.  HENT:     Head: Normocephalic and atraumatic.  Cardiovascular:     Rate and Rhythm: Normal rate and regular rhythm.     Pulses: Normal pulses.     Heart sounds: Murmur (2/6 systolic) heard.    Pulmonary:     Effort: Respiratory distress present.     Comments: Decreased breath sounds Abdominal:     General: Abdomen is flat. There is no distension.     Palpations: Abdomen is soft.     Tenderness: There is no abdominal tenderness. There is no guarding or rebound.  Musculoskeletal:     Right lower leg: No edema.     Left lower leg: Tenderness present. No edema.  Skin:    Comments: Cool lower extremities  Neurological:     General: No focal deficit present.     Mental Status: She is alert and oriented to person, place, and time. Mental status is at baseline.  Psychiatric:        Mood and Affect: Mood normal.        Behavior: Behavior normal.    Filed Weights   12/10/20 2000 12/13/20 0500  Weight: 68.6 kg 67.5 kg     Intake/Output Summary (Last 24 hours) at 12/13/2020 0624 Last data filed  at 12/13/2020 0500 Gross per 24 hour  Intake 240 ml  Output 450 ml  Net -210 ml   Net IO Since Admission: -2,010 mL [12/13/20 0624]  Pertinent Labs: CBC Latest Ref Rng & Units 12/12/2020 12/11/2020 12/10/2020  WBC 4.0 - 10.5 K/uL 7.5 10.2 11.8(H)  Hemoglobin 12.0 - 15.0 g/dL 20.6(H) 14.4 15.7(H)  Hematocrit 36.0 - 46.0 % 60.1(H) 41.8 48.7(H)  Platelets 150 - 400 K/uL 102(L) 231 302    CMP Latest Ref Rng & Units 12/12/2020 12/11/2020 12/10/2020  Glucose 70 - 99 mg/dL 034(V) 425(Z) 563(O)  BUN 8 - 23 mg/dL 20 12 11   Creatinine 0.44 - 1.00 mg/dL ) 7.56(E 3.32)  Sodium 135 - 145 mmol/L 140 140 139  Potassium 3.5 - 5.1 mmol/L 3.5 3.7 3.7  Chloride 98 - 111 mmol/L 101 106 103  CO2 22 - 32 mmol/L 26 21(L) 20(L)  Calcium 8.9 - 10.3 mg/dL 9.51(O) 8.4(Z) 9.0  Total Protein 6.5 - 8.1 g/dL 6.2(L) 5.8(L) 6.6  Total Bilirubin 0.3 - 1.2 mg/dL 0.8 1.2 1.0  Alkaline Phos 38 - 126 U/L 65 71 85  AST 15 - 41 U/L 25  28 26  ALT 0 - 44 U/L 20 16 16    Imaging: No results found.  Assessment/Plan:   Principal Problem:   Pneumonia due to COVID-19 virus Active Problems:   Essential hypertension   Chronic anticoagulation   Acute on chronic heart failure with preserved ejection fraction (HFpEF) [facility])  Patient Summary: [name] is 87yo female with hypertension, chronic diastolic heart failure, previous embolic CVA on chronic anticoagulation, hypothyroidism, GERDadmitted 12/31 with COVID-19 pneumonia.  #Acute hypoxic respiratory failure 2/2 COVID-19 pneumonia Patient on room air since this am, continues to not require supplemental oxygen. CRP 5.6, ferritin 60.  Patient's living facility [facility] has no COVID requirements in terms of accepting patient back. Plan to discharge back to facility once evaluated by PT.  - continue decadron (day 4/10) - trend inflammatory markers - continue incentive spirometry, flutter valve - robitussin q4 PRN - O2 to maintain saturation >90%, PT to evaluate today.  Will assess if she desats while ambulating.   #Acute on chronic heart failure exacerbation Denies shortness of breath this am. No lower extremity edema appreciated, patient appears euvolemic on exam - weight yesterday of 67.5 kg, down from 68.6 kg.  - strict I&O's  #Chest pain #Troponinemia #Systolic Murmur Denies chest pain or SOB this morning. Troponin I peaked at 1185 and trended downward, last trop of 950. Patient without chest pain and suspect this is secondary to COVID-19 infection. Systolic murmur documented on prior cardiology notes, though to be secondary to outflow obstruction. - will continue to monitor for chest pain, shortness of breath. If occurs, will consider repeat EKG and troponin - continue tele monitoring  #L leg pain Denies leg pain this am. LLE 6/10 negative for DVT 12/12/19. Patient in on chronic anticoagulation, unlikely this is a DVT.  - continue to monitor for worsening pain  #Acute kidney injury #Anion gap metabolic acidosis Cr 1.05> .88 with AG of 12. Suspect lactic acidosis in setting of infection. - repeat BMP in the am - avoid nephrotoxic medications  #Hypertension 148/80 this morning. AKi resolved at this time - lisinopril 5mg  daily  Diet: Heart Healthy IVF: None,None VTE: Eliquis BID Code: DNR PT/OT recs: Pending  Plan to call patient's daughter and update with plan.   Dispo: Anticipated discharge back to [facility] 0-1 days pending PT recommendations  DO Internal Medicine Resident PGY-1 Pager [phone] Please contact the on call pager after 5 pm and on weekends at [phone].

## 2020-12-14 DIAGNOSIS — U071 COVID-19: Secondary | ICD-10-CM | POA: Diagnosis not present

## 2020-12-14 DIAGNOSIS — J1282 Pneumonia due to coronavirus disease 2019: Secondary | ICD-10-CM | POA: Diagnosis not present

## 2020-12-14 LAB — BASIC METABOLIC PANEL
Anion gap: 9 (ref 5–15)
BUN: 26 mg/dL — ABNORMAL HIGH (ref 8–23)
CO2: 27 mmol/L (ref 22–32)
Calcium: 8.2 mg/dL — ABNORMAL LOW (ref 8.9–10.3)
Chloride: 100 mmol/L (ref 98–111)
Creatinine, Ser: 0.85 mg/dL (ref 0.44–1.00)
Glucose, Bld: 138 mg/dL — ABNORMAL HIGH (ref 70–99)
Potassium: 3.5 mmol/L (ref 3.5–5.1)
Sodium: 136 mmol/L (ref 135–145)

## 2020-12-14 LAB — CBC
HCT: 39 % (ref 36.0–46.0)
Hemoglobin: 13.5 g/dL (ref 12.0–15.0)
MCH: 32.8 pg (ref 26.0–34.0)
MCHC: 34.6 g/dL (ref 30.0–36.0)
MCV: 94.9 fL (ref 80.0–100.0)
Platelets: 233 10*3/uL (ref 150–400)
RBC: 4.11 MIL/uL (ref 3.87–5.11)
RDW: 12.9 % (ref 11.5–15.5)
WBC: 6.8 10*3/uL (ref 4.0–10.5)
nRBC: 0 % (ref 0.0–0.2)

## 2020-12-14 LAB — GLUCOSE, CAPILLARY
Glucose-Capillary: 117 mg/dL — ABNORMAL HIGH (ref 70–99)
Glucose-Capillary: 133 mg/dL — ABNORMAL HIGH (ref 70–99)
Glucose-Capillary: 141 mg/dL — ABNORMAL HIGH (ref 70–99)

## 2020-12-14 LAB — FERRITIN: Ferritin: 59 ng/mL (ref 11–307)

## 2020-12-14 LAB — MAGNESIUM: Magnesium: 2 mg/dL (ref 1.7–2.4)

## 2020-12-14 LAB — C-REACTIVE PROTEIN: CRP: 2.7 mg/dL — ABNORMAL HIGH (ref <1.0)

## 2020-12-14 LAB — PHOSPHORUS: Phosphorus: 3.4 mg/dL (ref 2.5–4.6)

## 2020-12-14 MED ORDER — GUAIFENESIN-DM 100-10 MG/5ML PO SYRP: 10.0000 mL | ORAL_SOLUTION | ORAL | 0 refills | Status: DC | PRN

## 2020-12-14 NOTE — Progress Notes (Signed)
HD#3 Subjective:  Overnight Events: No acute events overnight  Patient evaluated at bedside this morning, doing well and not requiring supplemental oxygen. Evaluated by PT/OT yesterday. PT recommended home health PT which the patient declined. OT recommended SNF facility and equipment of 3-1 bedside commode. She states she would not like to go to a SNF facility and prefers going back to [facility]. No other acute complaints or concerns at this time.   Objective:  Vital signs in last 24 hours: Vitals:   12/13/20 0500 12/13/20 1710 12/13/20 2151 12/14/20 0500  BP:  125/73 131/67 (!) 153/80  Pulse:  81 81 77  Resp:  20 20 17   Temp:  98.1 F (36.7 C) 98 F (36.7 C) 98.1 F (36.7 C)  TempSrc:   Oral Oral  SpO2:  96% 97% 93%  Weight: 67.5 kg     Height:       Supplemental O2: Room Air SpO2: 93 % O2 Flow Rate (L/min): 9 L/min FiO2 (%): 60 %   Physical Exam:  Physical Exam Vitals and nursing note reviewed.  Constitutional:      General: She is not in acute distress.    Appearance: She is not ill-appearing or toxic-appearing.  HENT:     Head: Normocephalic and atraumatic.  Cardiovascular:     Rate and Rhythm: Normal rate and regular rhythm.     Pulses: Normal pulses.     Heart sounds: Murmur (2/6 systolic) heard.  No friction rub. No gallop.   Pulmonary:     Effort: Pulmonary effort is normal. No respiratory distress.     Breath sounds: Normal breath sounds. No stridor. No wheezing, rhonchi or rales.  Abdominal:     General: Abdomen is flat. Bowel sounds are normal.     Palpations: Abdomen is soft.     Tenderness: There is no abdominal tenderness. There is no guarding or rebound.  Musculoskeletal:     Right lower leg: No edema.     Left lower leg: No edema.     Comments: Left antecubital fossa with minimal swelling, soft to palpate. No tenderness, erythema present. Extremities are warm to touch.   Skin:    General: Skin is warm.  Neurological:     General: No focal  deficit present.     Mental Status: She is alert and oriented to person, place, and time. Mental status is at baseline.  Psychiatric:        Mood and Affect: Mood normal.        Behavior: Behavior normal.    Filed Weights   12/10/20 2000 12/13/20 0500  Weight: 68.6 kg 67.5 kg    No intake or output data in the 24 hours ending 12/14/20 0516 Net IO Since Admission: -2,010 mL [12/14/20 0516]  Pertinent Labs: CBC Latest Ref Rng & Units 12/13/2020 12/12/2020 12/11/2020  WBC 4.0 - 10.5 K/uL 8.3 7.5 10.2  Hemoglobin 12.0 - 15.0 g/dL 02/08/2021 20.6(H) 14.4  Hematocrit 36.0 - 46.0 % 40.7 60.1(H) 41.8  Platelets 150 - 400 K/uL 226 102(L) 231    CMP Latest Ref Rng & Units 12/13/2020 12/12/2020 12/11/2020  Glucose 70 - 99 mg/dL 02/08/2021) 109(N) 235(T)  BUN 8 - 23 mg/dL 23 20 12   Creatinine 0.44 - 1.00 mg/dL 732(K ) 0.25  Sodium 135 - 145 mmol/L 136 140 140  Potassium 3.5 - 5.1 mmol/L 3.5 3.5 3.7  Chloride 98 - 111 mmol/L 98 101 106  CO2 22 - 32 mmol/L 26 26 21(L)  Calcium 8.9 - 10.3 mg/dL 0.6(C) 3.7(S) 2.8(B)  Total Protein 6.5 - 8.1 g/dL 5.9(L) 6.2(L) 5.8(L)  Total Bilirubin 0.3 - 1.2 mg/dL 0.9 0.8 1.2  Alkaline Phos 38 - 126 U/L 56 65 71  AST 15 - 41 U/L 20 25 28   ALT 0 - 44 U/L 16 20 16    Imaging: No results found.  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  #Acute hypoxic respiratory failure 2/2 COVID-19 pneumonia Patient on room air since this am and is at her baseline respiratory status, on ambulation O2 saturation above 90%. CRP 2.7, ferritin 59 both improved. [facility]'s living facility [facility]  has no COVID requirements in terms of accepting patient back. OT recommended SNF facility, patient declined. Recommended 3-1 bedside commode recommended. Plan to discharge back to facility today.  -discontinue decadron - continue incentive spirometry, flutter valve - robitussin q4 PRN written prescription for home  #Acute on chronic heart failure exacerbation Denies shortness of breath this am. No lower extremity edema appreciated, patient appears euvolemic on exam - Last weight 67.5 improved since admission   #Chest pain #Troponinemia secondary to COVID 19 infection #Systolic Murmur Denies chest pain or SOB this morning. Systolic murmur documented on prior cardiology notes, though to be secondary to outflow obstruction. - will continue to monitor for chest pain, shortness of breath. If occurs, will consider repeat EKG and troponin - continue tele monitoring  #L leg pain Does not endorse lower extremity pain this am. LLE 87yo negative for DVT 12/12/19. Patient in on chronic anticoagulation, unlikely this is a DVT. - continue to monitor for worsening pain  #Acute kidney injury #Anion gap metabolic acidosis Cr .88 > .85with AG of 9. Lactic acidosis resolved - avoid nephrotoxic medications  #Hypertension 153/80 this morning. AKI resolved at this time - lisinopril 5mg  continue at discharge  Diet: Heart Healthy Code: DNR PT/OT recs: PT recommended HHPT, patient refused. OT recommended SNF, patient refused.  Plan to call patient's daughter and update with plan.   Dispo: Anticipated discharge back to [facility] today  02/09/20 DO Internal Medicine Resident PGY-1 Pager [phone] Please contact the on call pager after 5 pm and on weekends at [phone].

## 2020-12-14 NOTE — Progress Notes (Signed)
Patient discharged off unit at this time. Patient left with transportation. All patient belongings collected and sent with patient. Patient denies any needs or concerns at this time.

## 2020-12-14 NOTE — Discharge Instructions (Addendum)
Thank you for allowing [place] to care for you, I am glad you have improved! You were diagnosed with COVID pneumonia, please follow up with your primary care provider. Call their office to see what there COVID protocol is for a post hospitalization follow up. You can stop quarantining 24 hours after your last fever ( over 100.4) or 10 days after symptom onset.    10 Things You Can Do to Manage Your COVID-19 Symptoms at Home If you have possible or confirmed COVID-19: 1. Stay home from work and school. And stay away from other public places. If you must go out, avoid using any kind of public transportation, ridesharing, or taxis. 2. Monitor your symptoms carefully. If your symptoms get worse, call your healthcare provider immediately. 3. Get rest and stay hydrated. 4. If you have a medical appointment, call the healthcare provider ahead of time and tell them that you have or may have COVID-19. 5. For medical emergencies, call 911 and notify the dispatch personnel that you have or may have COVID-19. 6. Cover your cough and sneezes with a tissue or use the inside of your elbow. 7. Wash your hands often with soap and water for at least 20 seconds or clean your hands with an alcohol-based hand sanitizer that contains at least 60% alcohol. 8. As much as possible, stay in a specific room and away from other people in your home. Also, you should use a separate bathroom, if available. If you need to be around other people in or outside of the home, wear a mask. 9. Avoid sharing personal items with other people in your household, like dishes, towels, and bedding. 10. Clean all surfaces that are touched often, like counters, tabletops, and doorknobs. Use household cleaning sprays or wipes according to the label instructions. [email or web address] 06/11/2019 This information is not intended to replace advice given to you by your health care provider. Make sure you discuss any questions you have with your health care  provider. Document Revised: 11/13/2019 Document Reviewed: 11/13/2019 Elsevier Patient Education  2020 [employer].

## 2020-12-14 NOTE — Progress Notes (Signed)
Attempted to call report to [facility]. Left my number with the secretary.

## 2020-12-14 NOTE — TOC Transition Note (Addendum)
Transition of Care [facility]) - CM/SW Discharge Note   Patient Details  Name: [facility] MRN: [id number] Date of Birth: 08-Jan-1922  Transition of Care [facility]) CM/SW Contact:  Beckie Busing, RN Phone Number: [phone]  12/14/2020, 2:51 PM   Clinical Narrative:    Patient to discharge back to [facility]. D/c packet at desk, bedside nurse made aware. Daughter Micki Riley made aware. No other needs noted at this time TOC will sign off.   Please call report to [name] [phone] Room # 47   Final next level of care: Assisted Living Barriers to Discharge: No Barriers Identified   Patient Goals and CMS Choice     Choice offered to / list presented to : NA  Discharge Placement              Patient chooses bed at: Other - please specify in the comment section below: [name]) Patient to be transferred to facility by: [facility] Name of family member notified: Gus Puma daughter Patient and family notified of of transfer: 12/14/20  Discharge Plan and Services                DME Arranged: N/A DME Agency: NA       HH Arranged: NA HH Agency: NA        Social Determinants of Health (SDOH) Interventions     Readmission Risk Interventions No flowsheet data found.

## 2020-12-14 NOTE — NC FL2 (Signed)
MEDICAID FL2 LEVEL OF CARE SCREENING TOOL     IDENTIFICATION  Patient Name: [facility] Birthdate: 08-25-1922 Sex: female Admission Date (Current Location): 12/10/2020  [facility] and [place] Number:  Producer, television/film/video and Address:  [facility] [place], [address] [place], [place] [address]      Provider Number: [id number]  Attending Physician Name and Address:  Gust Rung, DO  Relative Name and Phone Number:  Gus Puma [phone]    Current Level of Care: Hospital Recommended Level of Care: Assisted Living Facility Prior Approval Number:    Date Approved/Denied:   PASRR Number:    Discharge Plan: Other (Comment) (Assisted Living)    Current Diagnoses: Patient Active Problem List   Diagnosis Date Noted  . Pneumonia due to COVID-19 virus 12/10/2020  . Acute on chronic heart failure with preserved ejection fraction (HFpEF) (HCC)   . Dyspnea 06/01/2019  . Chronic anticoagulation 05/30/2019  . Hypothyroidism 05/30/2019  . Hyperlipidemia 05/30/2019  . Atherosclerosis of abdominal aorta (HCC) 05/30/2019  . Ileus (HCC) 05/30/2019  . SBO (small bowel obstruction) (HCC) 05/30/2019  . Elevated troponin level 05/30/2019  . Dependent on walker for ambulation 05/30/2019  . Essential hypertension 10/02/2014  . GERD (gastroesophageal reflux disease) 10/02/2014  . Glaucoma 10/02/2014  . History of stroke 05/29/2014    Orientation RESPIRATION BLADDER Height & Weight     Self,Time,Situation,Place  Normal Incontinent,External catheter Weight: 67.5 kg Height:  5\' 2"  (157.5 cm)  BEHAVIORAL SYMPTOMS/MOOD NEUROLOGICAL BOWEL NUTRITION STATUS   (n/a)  (none noted) Continent Diet  AMBULATORY STATUS COMMUNICATION OF NEEDS Skin   Supervision (min guard rolling walker) Verbally Normal                       Personal Care Assistance Level of Assistance  Bathing,Feeding,Dressing,Total care Bathing Assistance: Limited assistance Feeding  assistance: Limited assistance (set up) Dressing Assistance: Limited assistance Total Care Assistance: Limited assistance   Functional Limitations Info  Sight,Hearing,Speech Sight Info: Adequate Hearing Info: Adequate Speech Info: Adequate    SPECIAL CARE FACTORS FREQUENCY  PT (By licensed PT),OT (By licensed OT)     PT Frequency: 3X OT Frequency: 3X            Contractures Contractures Info: Not present    Additional Factors Info  Code Status,Allergies,Psychotropic,Insulin Sliding Scale,Isolation Precautions,Suctioning Needs Code Status Info: DNR Allergies Info: Cimetidine, Naproxen Sodium, Fluzone (Influenza Virus Vaccine), Fosamax (Alendronate Sodium), Loratadine, Iodinated Diagnostic Agents Psychotropic Info: none noted Insulin Sliding Scale Info: see d/c summary for SS info Isolation Precautions Info: contact/ airborne isolation Suctioning Needs: n/a      Discharge Medications: Medication List        TAKE these medications       acetaminophen 500 MG tablet Commonly known as: TYLENOL Take 500-1,000 mg by mouth See admin instructions. Take 500 mg by mouth at 8 AM and 8 PM, 1,000 mg at 2 PM, and an additional 500 mg once a day as needed for pain   apixaban 2.5 MG Tabs tablet Commonly known as: ELIQUIS Take 2.5 mg by mouth in the morning and at bedtime. Reported on 04/03/2016   atorvastatin 40 MG tablet Commonly known as: LIPITOR Take 40 mg by mouth daily.   BAZA PROTECT EX Apply 1 application topically as needed (to buttocks- for redness).   benzonatate 100 MG capsule Commonly known as: TESSALON Take 1 capsule (100 mg total) by mouth every 8 (eight) hours. What changed:  when to take this  reasons to take this   fluticasone 50 MCG/ACT nasal spray Commonly known as: FLONASE Place 1 spray into both nostrils at bedtime.   guaiFENesin-dextromethorphan 100-10 MG/5ML syrup Commonly known as: ROBITUSSIN DM Take 10 mLs by mouth every 4 (four)  hours as needed for cough.   HYDROcodone-acetaminophen 5-325 MG tablet Commonly known as: NORCO/VICODIN Take 1 tablet by mouth 3 (three) times daily as needed for moderate pain. What changed: when to take this   Lacosamide 100 MG Tabs Take 100 mg by mouth in the morning and at bedtime.   latanoprost 0.005 % ophthalmic solution Commonly known as: XALATAN Place 1 drop into both eyes See admin instructions. Instill 1 drop into both eyes every other night at bedtime   levothyroxine 25 MCG tablet Commonly known as: SYNTHROID Take 25 mcg by mouth daily before breakfast.   lisinopril 5 MG tablet Commonly known as: ZESTRIL Take 5 mg by mouth daily.   methocarbamol 500 MG tablet Commonly known as: ROBAXIN Take 500 mg by mouth 2 (two) times daily as needed for muscle spasms.   montelukast 10 MG tablet Commonly known as: SINGULAIR Take 10 mg by mouth at bedtime.   omeprazole 20 MG capsule Commonly known as: PRILOSEC Take 20 mg by mouth at bedtime.   polyethylene glycol 17 g packet Commonly known as: MIRALAX / GLYCOLAX Take 17 g by mouth daily. What changed:   when to take this  reasons to take this     Relevant Imaging Results:  Relevant Lab Results:   Additional Information SS# [id number]  Beckie Busing, RN

## 2020-12-14 NOTE — Discharge Summary (Signed)
Name: Raven Peterson MRN: [id number] DOB: 1922-05-14 87 y.o. PCP: Patient, No Pcp Per  Date of Admission: 12/10/2020  7:51 PM Date of Discharge: 12/14/2020 Attending Physician: Dr. Cleda Daub  Discharge Diagnosis: Principal Problem:   Pneumonia due to COVID-19 virus Active Problems:   Essential hypertension   Chronic anticoagulation   Acute on chronic heart failure with preserved ejection fraction (HFpEF) (HCC)    Discharge Medications: Allergies as of 12/14/2020      Reactions   Cimetidine Palpitations, Other (See Comments)   "Allergic," per MAR   Naproxen Sodium Other (See Comments)   Affects glaucoma medication   Fluzone [influenza Virus Vaccine] Other (See Comments)   "Allergic," per MAR   Fosamax [alendronate Sodium] Other (See Comments)   "Allergic," per MAR   Loratadine Other (See Comments)   "Allergic," per [facility]   Iodinated Diagnostic Agents Nausea Only      Medication List    TAKE these medications   acetaminophen 500 MG tablet Commonly known as: TYLENOL Take 500-1,000 mg by mouth See admin instructions. Take 500 mg by mouth at 8 AM and 8 PM, 1,000 mg at 2 PM, and an additional 500 mg once a day as needed for pain   apixaban 2.5 MG Tabs tablet Commonly known as: ELIQUIS Take 2.5 mg by mouth in the morning and at bedtime. Reported on 04/03/2016   atorvastatin 40 MG tablet Commonly known as: LIPITOR Take 40 mg by mouth daily.   BAZA PROTECT EX Apply 1 application topically as needed (to buttocks- for redness).   benzonatate 100 MG capsule Commonly known as: TESSALON Take 1 capsule (100 mg total) by mouth every 8 (eight) hours. What changed:   when to take this  reasons to take this   fluticasone 50 MCG/ACT nasal spray Commonly known as: FLONASE Place 1 spray into both nostrils at bedtime.   guaiFENesin-dextromethorphan 100-10 MG/5ML syrup Commonly known as: ROBITUSSIN DM Take 10 mLs by mouth every 4 (four) hours as needed for cough.    HYDROcodone-acetaminophen 5-325 MG tablet Commonly known as: NORCO/VICODIN Take 1 tablet by mouth 3 (three) times daily as needed for moderate pain. What changed: when to take this   Lacosamide 100 MG Tabs Take 100 mg by mouth in the morning and at bedtime.   latanoprost 0.005 % ophthalmic solution Commonly known as: XALATAN Place 1 drop into both eyes See admin instructions. Instill 1 drop into both eyes every other night at bedtime   levothyroxine 25 MCG tablet Commonly known as: SYNTHROID Take 25 mcg by mouth daily before breakfast.   lisinopril 5 MG tablet Commonly known as: ZESTRIL Take 5 mg by mouth daily.   methocarbamol 500 MG tablet Commonly known as: ROBAXIN Take 500 mg by mouth 2 (two) times daily as needed for muscle spasms.   montelukast 10 MG tablet Commonly known as: SINGULAIR Take 10 mg by mouth at bedtime.   omeprazole 20 MG capsule Commonly known as: PRILOSEC Take 20 mg by mouth at bedtime.   polyethylene glycol 17 g packet Commonly known as: MIRALAX / GLYCOLAX Take 17 g by mouth daily. What changed:   when to take this  reasons to take this            Durable Medical Equipment  (From admission, onward)         Start     Ordered   12/14/20 0000  DME Bedside commode       Comments: 3-1 bedside commode  Question:  Patient needs a bedside commode to treat with the following condition  Answer:  Gait abnormality   12/14/20 1037          Disposition and follow-up:   Raven Peterson was discharged from [facility] in Stable condition.  At the hospital follow up visit please address:  1.  Follow-up:  a. COVID 19 Pneumonia    B. Hypertension   C. Acute On Chronic Heart Failure Exacerbation  2.  Labs / imaging needed at time of follow-up: None  3.  Pending labs/ test needing follow-up: None  4.  Medication Changes  Started: None  Stopped: None  Abx -  None  Follow-up Appointments:  Follow-up Information     Rollene Rotunda, MD Follow up.   Specialty: Cardiology Why: Please call and schedule an appointment with your primary care provider for a post hospitlization follow up visit. Please let them know that you have COVID and follow their protocol before you can be seen Contact information: [address] [place] [address] [phone]               Hospital Course by problem list:  #Acute hypoxic respiratory failure 2/2 COVID-19 pneumonia Patient presenting with acute dyspnea, cough that began 12/31. She was found to be COVID positive. Initially required BiPAP for appropriate oxygenation and started on IV solumedrol. CXR with diffuse haziness and pulmonary congestion. Initial labs revealed leukocytosis, normal LFT's, normal CRP/ferritin/fibrinogen, elevated D-dimer. In addition to COVID-19, respiratory status also compromised due to hypervolemic state on exam. Throughout her hospitalization she was de-escelated to 9L HFNC and then maintained oxygen saturation above 90% on room air. Her inflammatory markers trended downward during her stay. She was initially placed on decadron and received steroids while admitted. She was in stable condition at discharge and not requiring supplemental oxygen. PT worked with the patient and she maintained O2 sat above 90%.   #Acute on chronic heart failure exacerbation Patient has history of chronic diastolic heart failure. Previously admitted June 2020 with acute hypoxic respiratory failure due to heart failure exacerbation with subsequent improvement with IV furosemide. TTE at that time revealed normal EF, diastolic dysfunction. On presentation initial presentation exam revealed pitting edema bilaterally with rales. CXR also with pulmonary congestion. BNP elevated at 700. Suspected CHF exacerbation in setting of COVID 19 infection. She was started on diuretics and her electrolytes were monitored. On discharge patient was euvolemic.   #Chest  pain #Troponinemia Patient presenting with a few hours of chest pain. Initial ECG without acute ischemic changes. Patient had elevated troponins that trended down after peaking. Suspect the elevation was secondary to COVID 19 infection. Patient denied chest pain moving forward and no cardiac abnormalities were noted.  #L leg pain Patient with posterior left lower leg pain with palpation. Stated this was a new problem. Denies hx previous DVT's, patient on chronic anticoagulation for her history of CVA. LLE ultrasound that was negative for DVT.   #Acute kidney injury #Anion gap metabolic acidosis Suspected to have dehydration causing acute AKI and lactic acidosis secondary to COVID infection. Both resolved and patient back to baseline upon discharge.   Discharge Vitals:   BP (!) 153/80 (BP Location: Right Arm)   Pulse 77   Temp 98.1 F (36.7 C) (Oral)   Resp 17   Ht 5\' 2"  (1.575 m)   Wt 67.5 kg   SpO2 93%   BMI 27.22 kg/m   Pertinent Labs, Studies, and Procedures:  CBC Latest Ref  Rng & Units 12/14/2020 12/13/2020 12/12/2020  WBC 4.0 - 10.5 K/uL 6.8 8.3 7.5  Hemoglobin 12.0 - 15.0 g/dL 13.5 13.6 20.6(H)  Hematocrit 36.0 - 46.0 % 39.0 40.7 60.1(H)  Platelets 150 - 400 K/uL 233 226 102(L)    CMP Latest Ref Rng & Units 12/14/2020 12/13/2020 12/12/2020  Glucose 70 - 99 mg/dL 138(H) 121(H) 127(H)  BUN 8 - 23 mg/dL 26(H) 23 20  Creatinine 0.44 - 1.00 mg/dL 0.85 0.88 1.05(H)  Sodium 135 - 145 mmol/L 136 136 140  Potassium 3.5 - 5.1 mmol/L 3.5 3.5 3.5  Chloride 98 - 111 mmol/L 100 98 101  CO2 22 - 32 mmol/L 27 26 26   Calcium 8.9 - 10.3 mg/dL 8.2(L) 8.4(L) 8.5(L)  Total Protein 6.5 - 8.1 g/dL - 5.9(L) 6.2(L)  Total Bilirubin 0.3 - 1.2 mg/dL - 0.9 0.8  Alkaline Phos 38 - 126 U/L - 56 65  AST 15 - 41 U/L - 20 25  ALT 0 - 44 U/L - 16 20    DG Chest Portable 1 View  Result Date: 12/10/2020 CLINICAL DATA:  Cough, [place] of breath, respiratory distress, suspected COVID 19 EXAM: PORTABLE CHEST 1  VIEW COMPARISON:  06/02/2019 FINDINGS: Single frontal view of the chest demonstrates an unremarkable cardiac silhouette. There is widespread bilateral ground-glass airspace disease. May be a trace right effusion. No pneumothorax. Severe osteoarthritis left shoulder unchanged. IMPRESSION: 1. Widespread ground-glass airspace disease, which may reflect atypical infection or edema. 2. Trace right pleural effusion. Electronically Signed   By: Randa Ngo M.D.   On: 12/10/2020 20:52   VAS [place] LOWER EXTREMITY VENOUS (DVT)  Result Date: 12/13/2020  Lower Venous DVT Study Indications: Pain.  Risk Factors: COVID 19 positive. Limitations: Poor ultrasound/tissue interface and patient positioning. Comparison Study: No prior studies. Performing Technologist: Oliver Hum RVT  Examination Guidelines: A complete evaluation includes B-mode imaging, spectral Doppler, color Doppler, and power Doppler as needed of all accessible portions of each vessel. Bilateral testing is considered an integral part of a complete examination. Limited examinations for reoccurring indications may be performed as noted. The reflux portion of the exam is performed with the patient in reverse Trendelenburg.  +-----+---------------+---------+-----------+----------+--------------+ RIGHTCompressibilityPhasicitySpontaneityPropertiesThrombus Aging +-----+---------------+---------+-----------+----------+--------------+ CFV  Full           Yes      Yes                                 +-----+---------------+---------+-----------+----------+--------------+   +---------+---------------+---------+-----------+----------+--------------+ LEFT     CompressibilityPhasicitySpontaneityPropertiesThrombus Aging +---------+---------------+---------+-----------+----------+--------------+ CFV      Full           Yes      Yes                                 +---------+---------------+---------+-----------+----------+--------------+ SFJ       Full                                                        +---------+---------------+---------+-----------+----------+--------------+ FV Prox  Full                                                        +---------+---------------+---------+-----------+----------+--------------+  FV Mid   Full                                                        +---------+---------------+---------+-----------+----------+--------------+ FV DistalFull           Yes      Yes                                 +---------+---------------+---------+-----------+----------+--------------+ PFV      Full                                                        +---------+---------------+---------+-----------+----------+--------------+ POP      Full           Yes      Yes                                 +---------+---------------+---------+-----------+----------+--------------+ PTV      Full                                                        +---------+---------------+---------+-----------+----------+--------------+ PERO     Full                                                        +---------+---------------+---------+-----------+----------+--------------+     Summary: RIGHT: - No evidence of common femoral vein obstruction.  LEFT: - There is no evidence of deep vein thrombosis in the lower extremity. However, portions of this examination were limited- see technologist comments above.  - No cystic structure found in the popliteal fossa.  *See table(s) above for measurements and observations. Electronically signed by Fabienne Bruns MD on 12/13/2020 at 12:16:05 PM.    Final      Discharge Instructions: Discharge Instructions    Call MD for:  difficulty breathing, headache or visual disturbances   Complete by: As directed    Call MD for:  extreme fatigue   Complete by: As directed    Call MD for:  persistant dizziness or light-headedness   Complete by: As directed    Call MD for:   persistant nausea and vomiting   Complete by: As directed    Call MD for:  redness, tenderness, or signs of infection (pain, swelling, redness, odor or green/yellow discharge around incision site)   Complete by: As directed    Call MD for:  severe uncontrolled pain   Complete by: As directed    Call MD for:  temperature >100.4   Complete by: As directed    DME Bedside commode   Complete by: As directed    3-1 bedside commode   Patient needs a bedside commode to treat with the following condition:  Gait abnormality   Diet - low sodium heart healthy   Complete by: As directed    Increase activity slowly   Complete by: As directed       Signed: Belva Agee, MD 12/14/2020, 11:11 AM   Pager: [phone]

## 2020-12-14 NOTE — TOC Progression Note (Addendum)
Transition of Care [facility]) - Progression Note    Patient Details  Name: Raven Peterson MRN: [id number] Date of Birth: July 18, 1922  [facility] CM/SW Contact  Beckie Busing, RN Phone Number: [phone]  12/14/2020, 11:11 AM  Clinical Narrative:     CM spoke with Clinton Sawyer at [place]. Clinton Sawyer confirms that CM needs to provide updated FL2 and the patient will need scripts for any new orders. FL2 has been initiated and is pending due to need to add med list from d/c summary. MD is aware and currently working on d/c summary. TOC will continue to follow.   1225 Message left for Clinton Sawyer to make her aware that FL2 is ready for review and to finalize plan to d/c patient to back [place] today. Awaiting return call.   1342 Attempted to reach Clinton Sawyer [phone]) to get final approval for patient to return. No response message sent.   1420 Just received message that patient is ok to return to [place]. D/c packet has been placed at the desk. Transport has been set up with [facility].          Expected Discharge Plan and Services           Expected Discharge Date: 12/14/20                                     Social Determinants of Health (SDOH) Interventions    Readmission Risk Interventions No flowsheet data found.

## 2021-01-13 ENCOUNTER — Emergency Department (HOSPITAL_COMMUNITY): Payer: Medicare Other

## 2021-01-13 ENCOUNTER — Inpatient Hospital Stay (HOSPITAL_COMMUNITY)
Admission: EM | Admit: 2021-01-13 | Discharge: 2021-01-18 | DRG: 291 | Disposition: A | Discharge status: Nursing Home (Includes ICF) | Payer: Medicare Other | Source: Skilled Nursing Facility | Attending: Internal Medicine | Admitting: Internal Medicine

## 2021-01-13 DIAGNOSIS — Z9049 Acquired absence of other specified parts of digestive tract: Secondary | ICD-10-CM | POA: Diagnosis not present

## 2021-01-13 DIAGNOSIS — Z8616 Personal history of COVID-19: Secondary | ICD-10-CM | POA: Diagnosis not present

## 2021-01-13 DIAGNOSIS — I161 Hypertensive emergency: Secondary | ICD-10-CM | POA: Diagnosis present

## 2021-01-13 DIAGNOSIS — Z9842 Cataract extraction status, left eye: Secondary | ICD-10-CM

## 2021-01-13 DIAGNOSIS — L8990 Pressure ulcer of unspecified site, unspecified stage: Secondary | ICD-10-CM | POA: Insufficient documentation

## 2021-01-13 DIAGNOSIS — I5031 Acute diastolic (congestive) heart failure: Secondary | ICD-10-CM

## 2021-01-13 DIAGNOSIS — M549 Dorsalgia, unspecified: Secondary | ICD-10-CM | POA: Diagnosis present

## 2021-01-13 DIAGNOSIS — R45851 Suicidal ideations: Secondary | ICD-10-CM | POA: Diagnosis present

## 2021-01-13 DIAGNOSIS — E039 Hypothyroidism, unspecified: Secondary | ICD-10-CM | POA: Diagnosis present

## 2021-01-13 DIAGNOSIS — Z825 Family history of asthma and other chronic lower respiratory diseases: Secondary | ICD-10-CM

## 2021-01-13 DIAGNOSIS — R0603 Acute respiratory distress: Secondary | ICD-10-CM | POA: Diagnosis present

## 2021-01-13 DIAGNOSIS — Z79899 Other long term (current) drug therapy: Secondary | ICD-10-CM | POA: Diagnosis not present

## 2021-01-13 DIAGNOSIS — Z8673 Personal history of transient ischemic attack (TIA), and cerebral infarction without residual deficits: Secondary | ICD-10-CM | POA: Diagnosis not present

## 2021-01-13 DIAGNOSIS — E785 Hyperlipidemia, unspecified: Secondary | ICD-10-CM | POA: Diagnosis present

## 2021-01-13 DIAGNOSIS — Z7189 Other specified counseling: Secondary | ICD-10-CM | POA: Diagnosis not present

## 2021-01-13 DIAGNOSIS — Z7901 Long term (current) use of anticoagulants: Secondary | ICD-10-CM

## 2021-01-13 DIAGNOSIS — Z79890 Hormone replacement therapy (postmenopausal): Secondary | ICD-10-CM

## 2021-01-13 DIAGNOSIS — Z888 Allergy status to other drugs, medicaments and biological substances status: Secondary | ICD-10-CM | POA: Diagnosis not present

## 2021-01-13 DIAGNOSIS — Z66 Do not resuscitate: Secondary | ICD-10-CM | POA: Diagnosis present

## 2021-01-13 DIAGNOSIS — R32 Unspecified urinary incontinence: Secondary | ICD-10-CM | POA: Diagnosis present

## 2021-01-13 DIAGNOSIS — Z9841 Cataract extraction status, right eye: Secondary | ICD-10-CM

## 2021-01-13 DIAGNOSIS — I5033 Acute on chronic diastolic (congestive) heart failure: Secondary | ICD-10-CM | POA: Diagnosis present

## 2021-01-13 DIAGNOSIS — Z515 Encounter for palliative care: Secondary | ICD-10-CM | POA: Diagnosis not present

## 2021-01-13 DIAGNOSIS — J810 Acute pulmonary edema: Secondary | ICD-10-CM

## 2021-01-13 DIAGNOSIS — B029 Zoster without complications: Secondary | ICD-10-CM | POA: Diagnosis present

## 2021-01-13 DIAGNOSIS — K219 Gastro-esophageal reflux disease without esophagitis: Secondary | ICD-10-CM | POA: Diagnosis present

## 2021-01-13 DIAGNOSIS — G8929 Other chronic pain: Secondary | ICD-10-CM | POA: Diagnosis present

## 2021-01-13 DIAGNOSIS — I2510 Atherosclerotic heart disease of native coronary artery without angina pectoris: Secondary | ICD-10-CM | POA: Diagnosis present

## 2021-01-13 DIAGNOSIS — I110 Hypertensive heart disease with heart failure: Principal | ICD-10-CM | POA: Diagnosis present

## 2021-01-13 DIAGNOSIS — Z91041 Radiographic dye allergy status: Secondary | ICD-10-CM | POA: Diagnosis not present

## 2021-01-13 DIAGNOSIS — I878 Other specified disorders of veins: Secondary | ICD-10-CM | POA: Diagnosis present

## 2021-01-13 DIAGNOSIS — J9601 Acute respiratory failure with hypoxia: Secondary | ICD-10-CM | POA: Diagnosis present

## 2021-01-13 DIAGNOSIS — J449 Chronic obstructive pulmonary disease, unspecified: Secondary | ICD-10-CM | POA: Diagnosis present

## 2021-01-13 DIAGNOSIS — I6523 Occlusion and stenosis of bilateral carotid arteries: Secondary | ICD-10-CM | POA: Diagnosis present

## 2021-01-13 DIAGNOSIS — Z5320 Procedure and treatment not carried out because of patient's decision for unspecified reasons: Secondary | ICD-10-CM | POA: Diagnosis not present

## 2021-01-13 HISTORY — DX: Unspecified glaucoma: H40.9

## 2021-01-13 HISTORY — DX: Gastro-esophageal reflux disease without esophagitis: K21.9

## 2021-01-13 HISTORY — DX: Hypothyroidism, unspecified: E03.9

## 2021-01-13 HISTORY — DX: COVID-19: U07.1

## 2021-01-13 HISTORY — DX: Dyspnea, unspecified: R06.00

## 2021-01-13 HISTORY — DX: Unspecified intestinal obstruction, unspecified as to partial versus complete obstruction: K56.609

## 2021-01-13 HISTORY — DX: Hyperlipidemia, unspecified: E78.5

## 2021-01-13 HISTORY — DX: Acute diastolic (congestive) heart failure: I50.31

## 2021-01-13 LAB — BASIC METABOLIC PANEL
Anion gap: 11 (ref 5–15)
BUN: 9 mg/dL (ref 8–23)
CO2: 22 mmol/L (ref 22–32)
Calcium: 8.9 mg/dL (ref 8.9–10.3)
Chloride: 106 mmol/L (ref 98–111)
Creatinine, Ser: 0.86 mg/dL (ref 0.44–1.00)
GFR, Estimated: >60 mL/min (ref >60)
Glucose, Bld: 108 mg/dL — ABNORMAL HIGH (ref 70–99)
Potassium: 3.8 mmol/L (ref 3.5–5.1)
Sodium: 139 mmol/L (ref 135–145)

## 2021-01-13 LAB — BRAIN NATRIURETIC PEPTIDE: B Natriuretic Peptide: 369.2 pg/mL — ABNORMAL HIGH (ref 0.0–100.0)

## 2021-01-13 LAB — CBC
HCT: 44.8 % (ref 36.0–46.0)
Hemoglobin: 14.4 g/dL (ref 12.0–15.0)
MCH: 32.3 pg (ref 26.0–34.0)
MCHC: 32.1 g/dL (ref 30.0–36.0)
MCV: 100.4 fL — ABNORMAL HIGH (ref 80.0–100.0)
Platelets: 288 10*3/uL (ref 150–400)
RBC: 4.46 MIL/uL (ref 3.87–5.11)
RDW: 13.5 % (ref 11.5–15.5)
WBC: 5.7 10*3/uL (ref 4.0–10.5)
nRBC: 0 % (ref 0.0–0.2)

## 2021-01-13 LAB — D-DIMER, QUANTITATIVE: D-Dimer, Quant: 0.63 ug/mL-FEU — ABNORMAL HIGH (ref 0.00–0.50)

## 2021-01-13 LAB — TROPONIN I (HIGH SENSITIVITY)
Troponin I (High Sensitivity): 121 ng/L (ref <18)
Troponin I (High Sensitivity): 122 ng/L (ref <18)

## 2021-01-13 MED ORDER — ACETAMINOPHEN 325 MG PO TABS: 650.0000 mg | ORAL_TABLET | ORAL | Status: DC | PRN

## 2021-01-13 MED ORDER — FUROSEMIDE 10 MG/ML IJ SOLN
40.0000 mg | Freq: Two times a day (BID) | INTRAMUSCULAR | Status: DC
  Administered 2021-01-14 – 2021-01-16 (×5): 40 mg via INTRAVENOUS
  Filled 2021-01-13 (×5): qty 4

## 2021-01-13 MED ORDER — METHOCARBAMOL 500 MG PO TABS: 500.0000 mg | ORAL_TABLET | Freq: Two times a day (BID) | ORAL | Status: DC | PRN

## 2021-01-13 MED ORDER — SENNOSIDES-DOCUSATE SODIUM 8.6-50 MG PO CAPS: 1.0000 | ORAL_CAPSULE | Freq: Every day | ORAL | Status: DC

## 2021-01-13 MED ORDER — VALACYCLOVIR HCL 500 MG PO TABS
1000.0000 mg | ORAL_TABLET | Freq: Once | ORAL | Status: AC
  Administered 2021-01-13: 1000 mg via ORAL
  Filled 2021-01-13: qty 2

## 2021-01-13 MED ORDER — SODIUM CHLORIDE 0.9% FLUSH
3.0000 mL | Freq: Two times a day (BID) | INTRAVENOUS | Status: DC
  Administered 2021-01-14 – 2021-01-17 (×6): 3 mL via INTRAVENOUS

## 2021-01-13 MED ORDER — VALACYCLOVIR HCL 1 G PO TABS: 1000.0000 mg | ORAL_TABLET | Freq: Three times a day (TID) | ORAL | 0 refills | Status: DC

## 2021-01-13 MED ORDER — MONTELUKAST SODIUM 10 MG PO TABS
10.0000 mg | ORAL_TABLET | Freq: Every day | ORAL | Status: DC
  Administered 2021-01-14 – 2021-01-17 (×5): 10 mg via ORAL
  Filled 2021-01-13 (×6): qty 1

## 2021-01-13 MED ORDER — FLUTICASONE PROPIONATE 50 MCG/ACT NA SUSP
1.0000 | Freq: Every day | NASAL | Status: DC
  Filled 2021-01-13: qty 16

## 2021-01-13 MED ORDER — POLYETHYLENE GLYCOL 3350 17 G PO PACK
17.0000 g | PACK | Freq: Every day | ORAL | Status: DC
  Administered 2021-01-14 – 2021-01-15 (×2): 17 g via ORAL
  Filled 2021-01-13 (×2): qty 1

## 2021-01-13 MED ORDER — PANTOPRAZOLE SODIUM 40 MG PO TBEC
40.0000 mg | DELAYED_RELEASE_TABLET | Freq: Every day | ORAL | Status: DC
  Administered 2021-01-14 – 2021-01-17 (×4): 40 mg via ORAL
  Filled 2021-01-13 (×4): qty 1

## 2021-01-13 MED ORDER — LACOSAMIDE 50 MG PO TABS
100.0000 mg | ORAL_TABLET | Freq: Two times a day (BID) | ORAL | Status: DC
  Administered 2021-01-14 – 2021-01-17 (×9): 100 mg via ORAL
  Filled 2021-01-13 (×9): qty 2

## 2021-01-13 MED ORDER — ATORVASTATIN CALCIUM 40 MG PO TABS
40.0000 mg | ORAL_TABLET | Freq: Every day | ORAL | Status: DC
  Administered 2021-01-14 – 2021-01-17 (×4): 40 mg via ORAL
  Filled 2021-01-13 (×4): qty 1

## 2021-01-13 MED ORDER — NITROGLYCERIN 2 % TD OINT: 1.0000 [in_us] | TOPICAL_OINTMENT | Freq: Once | TRANSDERMAL | Status: DC

## 2021-01-13 MED ORDER — APIXABAN 2.5 MG PO TABS
2.5000 mg | ORAL_TABLET | Freq: Two times a day (BID) | ORAL | Status: DC
  Administered 2021-01-14 – 2021-01-17 (×9): 2.5 mg via ORAL
  Filled 2021-01-13 (×10): qty 1

## 2021-01-13 MED ORDER — LEVOTHYROXINE SODIUM 25 MCG PO TABS
25.0000 ug | ORAL_TABLET | Freq: Every day | ORAL | Status: DC
  Administered 2021-01-14 – 2021-01-17 (×4): 25 ug via ORAL
  Filled 2021-01-13 (×4): qty 1

## 2021-01-13 MED ORDER — LABETALOL HCL 5 MG/ML IV SOLN
10.0000 mg | Freq: Once | INTRAVENOUS | Status: DC
  Filled 2021-01-13: qty 4

## 2021-01-13 MED ORDER — LISINOPRIL 5 MG PO TABS
5.0000 mg | ORAL_TABLET | Freq: Every day | ORAL | Status: DC
  Administered 2021-01-14 – 2021-01-17 (×4): 5 mg via ORAL
  Filled 2021-01-13 (×4): qty 1

## 2021-01-13 MED ORDER — SODIUM CHLORIDE 0.9% FLUSH: 3.0000 mL | INTRAVENOUS | Status: DC | PRN

## 2021-01-13 MED ORDER — LATANOPROST 0.005 % OP SOLN
1.0000 [drp] | OPHTHALMIC | Status: DC
  Filled 2021-01-13: qty 2.5

## 2021-01-13 MED ORDER — BAZA PROTECT EX CREA: TOPICAL_CREAM | Freq: Every day | CUTANEOUS | Status: DC | PRN

## 2021-01-13 MED ORDER — NITROGLYCERIN IN D5W 200-5 MCG/ML-% IV SOLN
0.0000 ug/min | INTRAVENOUS | Status: DC
  Administered 2021-01-13: 5 ug/min via INTRAVENOUS
  Filled 2021-01-13: qty 250

## 2021-01-13 MED ORDER — LACTATED RINGERS IV BOLUS
500.0000 mL | Freq: Once | INTRAVENOUS | Status: AC
  Administered 2021-01-13: 500 mL via INTRAVENOUS

## 2021-01-13 MED ORDER — ACETAMINOPHEN 500 MG PO TABS
500.0000 mg | ORAL_TABLET | ORAL | Status: DC
Start: 2021-01-13 — End: 2021-01-13

## 2021-01-13 MED ORDER — ONDANSETRON HCL 4 MG/2ML IJ SOLN: 4.0000 mg | Freq: Four times a day (QID) | INTRAMUSCULAR | Status: DC | PRN

## 2021-01-13 MED ORDER — LIDOCAINE 5 % EX PTCH
1.0000 | MEDICATED_PATCH | CUTANEOUS | Status: DC
  Administered 2021-01-13 – 2021-01-17 (×5): 1 via TRANSDERMAL
  Filled 2021-01-13 (×5): qty 1

## 2021-01-13 MED ORDER — ZINC OXIDE 40 % EX OINT: TOPICAL_OINTMENT | Freq: Every day | CUTANEOUS | Status: DC | PRN

## 2021-01-13 MED ORDER — SODIUM CHLORIDE 0.9 % IV SOLN: 250.0000 mL | INTRAVENOUS | Status: DC | PRN

## 2021-01-13 MED ORDER — FUROSEMIDE 10 MG/ML IJ SOLN
40.0000 mg | Freq: Once | INTRAMUSCULAR | Status: AC
  Administered 2021-01-13: 40 mg via INTRAVENOUS
  Filled 2021-01-13: qty 4

## 2021-01-13 MED ORDER — SENNOSIDES-DOCUSATE SODIUM 8.6-50 MG PO TABS
1.0000 | ORAL_TABLET | Freq: Every day | ORAL | Status: DC
  Administered 2021-01-14 – 2021-01-15 (×2): 1 via ORAL
  Filled 2021-01-13 (×4): qty 1

## 2021-01-13 MED ORDER — NITROGLYCERIN 0.4 MG SL SUBL
0.4000 mg | SUBLINGUAL_TABLET | Freq: Once | SUBLINGUAL | Status: AC
  Administered 2021-01-13: 0.4 mg via SUBLINGUAL
  Filled 2021-01-13: qty 1

## 2021-01-13 NOTE — ED Notes (Signed)
MD and RT at bedside. °

## 2021-01-13 NOTE — ED Notes (Signed)
Pt placed on 2 L. 

## 2021-01-13 NOTE — Progress Notes (Signed)
Pt placed on NIV/PCV/BIpap on servo I 15/5 BUR 16 w/FIO2 100%. Pt tolerating well. RT will continue to monitor.

## 2021-01-13 NOTE — ED Notes (Signed)
Pt placed on pure wick at this time.  

## 2021-01-13 NOTE — ED Notes (Signed)
RT put pt on Bi-pap

## 2021-01-13 NOTE — ED Triage Notes (Signed)
Pt arrives from [facility] via [facility] with shortness of breath started just today however did have covid on 12/10/20.  With chest pain felt like she could not take a deep breath.

## 2021-01-13 NOTE — ED Provider Notes (Signed)
[facility] EMERGENCY DEPARTMENT Provider Note   CSN: [id number] Arrival date & time: 01/13/21  1631     History Chief Complaint  Patient presents with  . Shortness of Breath    Raven Peterson is a 87 y.o. female.  The history is provided by the patient.  Chest Pain Pain location:  Substernal area Pain radiates to:  Does not radiate Onset quality:  Gradual Progression:  Waxing and waning Chronicity:  New Context: breathing   Relieved by:  Nothing Worsened by:  Nothing Ineffective treatments:  None tried Associated symptoms: shortness of breath   Associated symptoms: no abdominal pain, no back pain, no cough, no fever, no palpitations and no vomiting   Risk factors: coronary artery disease and hypertension        Past Medical History:  Diagnosis Date  . Acute ischemic stroke (HCC) 10/02/2014   right cerebellar and left posterior temporal CVA: This was confirmed on MRI. We suspected an embolic source given the distribution on MRI. She has no history of atrial fib and no atrial fib was seen on telemetry. She was seen by neurology in consultation and underwent carotid US which showed mild right carotid stenosis (<50%) and moderate left carotid stenosis (50-69%). She was treated with aspiri  . CAP (community acquired pneumonia) 03/17/2015  . Chronic back pain    "used to get shots in my back by a Pain Management doctor"  . Hypertension   . Inferior pubic ramus fracture, right, with routine healing, subsequent encounter   . Seizure (HCC) 10/02/2014  . Thyroid disease   . UTI from E.coli 2018  . Vertebral compression fracture [facility])     Patient Active Problem List   Diagnosis Date Noted  . Acute heart failure with preserved ejection fraction (HFpEF) (HCC) 01/13/2021  . Pneumonia due to COVID-19 virus 12/10/2020  . Acute on chronic heart failure with preserved ejection fraction (HFpEF) (HCC)   . Dyspnea 06/01/2019  . Chronic anticoagulation 05/30/2019  .  Hypothyroidism 05/30/2019  . Hyperlipidemia 05/30/2019  . Atherosclerosis of abdominal aorta (HCC) 05/30/2019  . Ileus (HCC) 05/30/2019  . SBO (small bowel obstruction) (HCC) 05/30/2019  . Elevated troponin level 05/30/2019  . Dependent on walker for ambulation 05/30/2019  . Essential hypertension 10/02/2014  . GERD (gastroesophageal reflux disease) 10/02/2014  . Glaucoma 10/02/2014  . History of stroke 05/29/2014    Past Surgical History:  Procedure Laterality Date  . CATARACT EXTRACTION, BILATERAL    . CHOLECYSTECTOMY       OB History   No obstetric history on file.     Family History  Problem Relation Age of Onset  . Healthy Mother   . Asthma Father     Social History   Tobacco Use  . Smoking status: Never Smoker  . Smokeless tobacco: Never Used  Vaping Use  . Vaping Use: Never used  Substance Use Topics  . Alcohol use: No  . Drug use: No    Home Medications Prior to Admission medications   Medication Sig Start Date End Date Taking? Authorizing Provider  acetaminophen (TYLENOL) 500 MG tablet Take 500-1,000 mg by mouth See admin instructions. Take 500 mg by mouth at 8 AM and 8 PM, 1,000 mg at 2 PM, and an additional 500 mg once a day as needed for pain   Yes [provider]  apixaban (ELIQUIS) 2.5 MG TABS tablet Take 2.5 mg by mouth in the morning and at bedtime. Reported on 04/03/2016 06/16/15 01/13/21 Yes [provider]  atorvastatin (LIPITOR) 40 MG tablet Take 40 mg by mouth daily.   Yes [provider]  benzonatate (TESSALON) 100 MG capsule Take 1 capsule (100 mg total) by mouth every 8 (eight) hours. Patient taking differently: Take 100 mg by mouth every 8 (eight) hours as needed for cough. 07/07/18  Yes Burky, Barron Alvine, NP  fluticasone (FLONASE) 50 MCG/ACT nasal spray Place 1 spray into both nostrils at bedtime.   Yes [provider]  HYDROcodone-acetaminophen (NORCO/VICODIN) 5-325 MG tablet Take 1 tablet by mouth 3 (three)  times daily as needed for moderate pain. Patient taking differently: Take 1 tablet by mouth 3 (three) times daily. 06/03/19  Yes Arrien, York Ram, MD  Lacosamide 100 MG TABS Take 100 mg by mouth in the morning and at bedtime. 07/13/15 01/13/21 Yes [provider]  latanoprost (XALATAN) 0.005 % ophthalmic solution Place 1 drop into both eyes See admin instructions. Instill 1 drop into both eyes every other night at bedtime   Yes [provider]  levothyroxine (SYNTHROID, LEVOTHROID) 25 MCG tablet Take 25 mcg by mouth daily before breakfast.  07/13/15  Yes [provider]  lisinopril (PRINIVIL,ZESTRIL) 5 MG tablet Take 5 mg by mouth daily.    Yes [provider]  methocarbamol (ROBAXIN) 500 MG tablet Take 500 mg by mouth 2 (two) times daily as needed for muscle spasms.   Yes [provider]  montelukast (SINGULAIR) 10 MG tablet Take 10 mg by mouth at bedtime.   Yes [provider]  omeprazole (PRILOSEC) 20 MG capsule Take 20 mg by mouth at bedtime.   Yes [provider]  polyethylene glycol (MIRALAX / GLYCOLAX) 17 g packet Take 17 g by mouth daily. 06/03/19  Yes Arrien, York Ram, MD  SENNA PLUS 50-8.6 MG CAPS Take 1 capsule by mouth at bedtime. 12/20/20  Yes [provider]  Skin Protectants, Misc. (BAZA PROTECT EX) Apply 1 application topically as needed (to buttocks- for redness).   Yes [provider]  valACYclovir (VALTREX) 1000 MG tablet Take 1 tablet (1,000 mg total) by mouth 3 (three) times daily for 7 days. 01/13/21 01/20/21 Yes Kathleen Lime, MD    Allergies    Cimetidine, Naproxen sodium, Fluzone [influenza virus vaccine], Fosamax [alendronate sodium], Loratadine, and Iodinated diagnostic agents  Review of Systems   Review of Systems  Constitutional: Negative for chills and fever.  HENT: Negative for ear pain and sore throat.   Eyes: Negative for pain and visual disturbance.  Respiratory: Positive for  shortness of breath. Negative for cough.   Cardiovascular: Positive for chest pain. Negative for palpitations.  Gastrointestinal: Negative for abdominal pain and vomiting.  Genitourinary: Negative for dysuria and hematuria.  Musculoskeletal: Negative for arthralgias and back pain.  Skin: Negative for color change and rash.  Neurological: Negative for seizures and syncope.  All other systems reviewed and are negative.   Physical Exam Updated Vital Signs BP 120/60   Pulse 95   Temp 97.8 F (36.6 C) (Oral)   Resp (!) 27   Ht 5\' 4"  (1.626 m)   Wt 65.8 kg   SpO2 93%   BMI 24.89 kg/m   Physical Exam Vitals and nursing note reviewed.  Constitutional:      General: She is not in acute distress.    Appearance: She is well-developed and well-nourished.  HENT:     Head: Normocephalic and atraumatic.  Eyes:     Conjunctiva/sclera: Conjunctivae normal.  Cardiovascular:  Rate and Rhythm: Normal rate and regular rhythm.     Heart sounds: No murmur heard.   Pulmonary:     Effort: Pulmonary effort is normal. No respiratory distress.     Breath sounds: Normal breath sounds.  Abdominal:     Palpations: Abdomen is soft.     Tenderness: There is no abdominal tenderness.  Musculoskeletal:     Cervical back: Neck supple.     Right lower leg: 1+ Pitting Edema present.     Left lower leg: 1+ Pitting Edema present.  Skin:    General: Skin is warm and dry.  Neurological:     Mental Status: She is alert.  Psychiatric:        Mood and Affect: Mood and affect normal.     ED Results / Procedures / Treatments   Labs (all labs ordered are listed, but only abnormal results are displayed) Labs Reviewed  BASIC METABOLIC PANEL - Abnormal; Notable for the following components:      Result Value   Glucose, Bld 108 (*)    All other components within normal limits  CBC - Abnormal; Notable for the following components:   MCV 100.4 (*)    All other components within normal limits  BRAIN  NATRIURETIC PEPTIDE - Abnormal; Notable for the following components:   B Natriuretic Peptide 369.2 (*)    All other components within normal limits  D-DIMER, QUANTITATIVE (NOT AT [facility]) - Abnormal; Notable for the following components:   D-Dimer, Quant 0.63 (*)    All other components within normal limits  I-STAT ARTERIAL BLOOD GAS, ED - Abnormal; Notable for the following components:   pO2, Arterial 255 (*)    Potassium 3.4 (*)    All other components within normal limits  TROPONIN I (HIGH SENSITIVITY) - Abnormal; Notable for the following components:   Troponin I (High Sensitivity) 121 (*)    All other components within normal limits  TROPONIN I (HIGH SENSITIVITY) - Abnormal; Notable for the following components:   Troponin I (High Sensitivity) 122 (*)    All other components within normal limits  BLOOD GAS, ARTERIAL  BASIC METABOLIC PANEL  TSH    EKG EKG Interpretation  Date/Time:  Thursday January 13 2021 16:41:05 EST Ventricular Rate:  96 PR Interval:  196 QRS Duration: 88 QT Interval:  366 QTC Calculation: 462 R Axis:   47 Text Interpretation: Normal sinus rhythm Anteroseptal infarct , age undetermined ST & T wave abnormality, consider inferolateral ischemia Abnormal ECG No significant change since last tracing Confirmed by Jacalyn LefevreHaviland, Julie [phone]([id number]) on 01/13/2021 6:21:28 PM   Radiology DG Chest 2 View  Result Date: 01/13/2021 CLINICAL DATA:  87 year old female with shortness of breath. EXAM: CHEST - 2 VIEW COMPARISON:  Chest radiograph dated 12/10/2020. FINDINGS: Trace bilateral pleural effusions. There is diffuse interstitial coarsening, likely chronic. Mild edema is not excluded. No focal consolidation or pneumothorax. Interval clearing of the previously seen bilateral hazy pulmonary opacities. Stable cardiomediastinal silhouette. Atherosclerotic calcification of the aorta. Osteopenia with degenerative changes of the spine and shoulders. No acute osseous pathology.  IMPRESSION: Trace bilateral pleural effusions. No focal consolidation. Electronically Signed   By: Elgie CollardArash  Radparvar M.D.   On: 01/13/2021 17:16   DG Chest Portable 1 View  Result Date: 01/13/2021 CLINICAL DATA:  Wheezing and shortness of breath. EXAM: PORTABLE CHEST 1 VIEW COMPARISON:  Radiograph earlier today.  Radiograph 12/10/2020 FINDINGS: Mild cardiomegaly is unchanged. Aortic atherosclerosis. Small pleural effusions is equivocally improved from prior. Mild pulmonary  edema similar. No focal airspace disease. No pneumothorax. Chronic change about the left proximal humerus. IMPRESSION: 1. Stable cardiomegaly. Interstitial coarsening likely pulmonary edema. 2. Small pleural effusions are equivocally improved from radiograph 4 hours ago. Electronically Signed   By: Narda Rutherford M.D.   On: 01/13/2021 21:43    Procedures Procedures   Medications Ordered in ED Medications  lidocaine (LIDODERM) 5 % 1 patch (1 patch Transdermal Patch Applied 01/13/21 2026)  labetalol (NORMODYNE) injection 10 mg (10 mg Intravenous Not Given 01/13/21 2206)  atorvastatin (LIPITOR) tablet 40 mg (has no administration in time range)  lisinopril (ZESTRIL) tablet 5 mg (has no administration in time range)  levothyroxine (SYNTHROID) tablet 25 mcg (has no administration in time range)  pantoprazole (PROTONIX) EC tablet 40 mg (has no administration in time range)  polyethylene glycol (MIRALAX / GLYCOLAX) packet 17 g (has no administration in time range)  apixaban (ELIQUIS) tablet 2.5 mg (has no administration in time range)  lacosamide (VIMPAT) tablet 100 mg (has no administration in time range)  methocarbamol (ROBAXIN) tablet 500 mg (has no administration in time range)  fluticasone (FLONASE) 50 MCG/ACT nasal spray 1 spray (has no administration in time range)  montelukast (SINGULAIR) tablet 10 mg (has no administration in time range)  latanoprost (XALATAN) 0.005 % ophthalmic solution 1 drop (has no administration in time  range)  sodium chloride flush (NS) 0.9 % injection 3 mL (has no administration in time range)  sodium chloride flush (NS) 0.9 % injection 3 mL (has no administration in time range)  0.9 %  sodium chloride infusion (has no administration in time range)  acetaminophen (TYLENOL) tablet 650 mg (has no administration in time range)  ondansetron (ZOFRAN) injection 4 mg (has no administration in time range)  furosemide (LASIX) injection 40 mg (has no administration in time range)  senna-docusate (Senokot-S) tablet 1 tablet (has no administration in time range)  liver oil-zinc oxide (DESITIN) 40 % ointment (has no administration in time range)  valACYclovir (VALTREX) tablet 1,000 mg (1,000 mg Oral Given 01/13/21 2025)  nitroGLYCERIN (NITROSTAT) SL tablet 0.4 mg (0.4 mg Sublingual Given 01/13/21 2206)  furosemide (LASIX) injection 40 mg (40 mg Intravenous Given 01/13/21 2211)  lactated ringers bolus 500 mL (500 mLs Intravenous New Bag/Given 01/13/21 2356)    ED Course  I have reviewed the triage vital signs and the nursing notes.  Pertinent labs & imaging results that were available during my care of the patient were reviewed by me and considered in my medical decision making (see chart for details).    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  Patient reports she was recently admitted to the hospital for COVID pneumonia and discharged approximately 1 month ago, however over the last days she has started to have some intermittent episodes of shortness of breath.  She describes her chest pain as a pressure that is nonradiating.  She reports that her legs have been more swollen, which she has had in the past and needs to take a water pill for.  On  arrival the patient is hypertensive, no significant distress, afebrile.  Heart and lungs are within normal limits, she does have 1+  pitting edema to bilateral lower extremities which appears to be chronic for her.  She has some mild tenderness over the left calf without any skin changes or significant warmth.  Abdomen soft nontender.  Reviewing her recent admission, the patient did have HFpEF exacerbation in which she was started on Lasix and underwent a Doppler ultrasound the left lower extremity that was negative for DVT.  From a Covid pneumonia standpoint she was able to be titrated down to room air and was stable for discharge at that time.  Work-up in the emergency department shows CBC and chemistry near baseline, her BNP is elevated at 300 but appears to be down from around 2000 when she was admitted earlier last month.  Her troponin is elevated at 122 with a repeat of 121, although elevated this is down from her admission troponin last month of 1000.  EKG shows no significant ischemic changes and the patient is now chest pain-free, doubt ACS.  With the patient's leg pain as well as concern for dyspnea and chest pain, pulmonary embolism remains in the differential, unfortunately the patient has a contrast allergy and has poor IV access for a contrasted study.  A D-dimer was obtained and was within normal limits when adjusted for age, do not believe her chest pain and dyspnea to be secondary to PE.  While in the emergency department the patient was noted to have an acute increase in her blood pressure with systolics in the 220s, during this time she had a change in respiratory status to tachypnea, wheezing, respiratory distress as well as tachycardia.  Her presentation and the quick onset of her symptoms were likely secondary to flash pulmonary edema (sympathetic crashing acute pulmonary edema), she was given 1 tablet of sublingual nitro, started on a nitroglycerin drip, and transition to BiPAP as her  oxygen saturations dropped into the 50s during her acute episode of distress.  She had a prompt response and oxygen saturation on the BiPAP to 100%, with decrease in her blood pressure her respiratory symptoms, wheezing, and tachypnea also improved.  Patient was admitted to internal medicine and was slowly transitioned off the nitro drip with normalization of her blood pressure and was able to be weaned off the BiPAP with maintenance of her oxygenation.  Final Clinical Impression(s) / ED Diagnoses Final diagnoses:  Acute respiratory distress  Acute heart failure with preserved ejection fraction (HFpEF) (HCC)  Flash pulmonary edema (HCC)    Rx / DC Orders ED Discharge Orders         Ordered    valACYclovir (VALTREX) 1000 MG tablet  3 times daily        01/13/21 2057           Kathleen Lime, MD 01/14/21 Georgiann Mohs    Jacalyn Lefevre, MD 01/14/21 1559

## 2021-01-14 ENCOUNTER — Inpatient Hospital Stay (HOSPITAL_COMMUNITY): Payer: Medicare Other

## 2021-01-14 LAB — BASIC METABOLIC PANEL
Anion gap: 13 (ref 5–15)
BUN: 9 mg/dL (ref 8–23)
CO2: 22 mmol/L (ref 22–32)
Calcium: 8.6 mg/dL — ABNORMAL LOW (ref 8.9–10.3)
Chloride: 104 mmol/L (ref 98–111)
Creatinine, Ser: 0.97 mg/dL (ref 0.44–1.00)
GFR, Estimated: 53 mL/min — ABNORMAL LOW (ref >60)
Glucose, Bld: 115 mg/dL — ABNORMAL HIGH (ref 70–99)
Potassium: 3.7 mmol/L (ref 3.5–5.1)
Sodium: 139 mmol/L (ref 135–145)

## 2021-01-14 LAB — I-STAT ARTERIAL BLOOD GAS, ED
Acid-base deficit: 2 mmol/L (ref 0.0–2.0)
Bicarbonate: 23.3 mmol/L (ref 20.0–28.0)
Calcium, Ion: 1.19 mmol/L (ref 1.15–1.40)
HCT: 42 % (ref 36.0–46.0)
Hemoglobin: 14.3 g/dL (ref 12.0–15.0)
O2 Saturation: 100 %
Patient temperature: 97.8
Potassium: 3.4 mmol/L — ABNORMAL LOW (ref 3.5–5.1)
Sodium: 139 mmol/L (ref 135–145)
TCO2: 24 mmol/L (ref 22–32)
pCO2 arterial: 39.2 mmHg (ref 32.0–48.0)
pH, Arterial: 7.379 (ref 7.350–7.450)
pO2, Arterial: 255 mmHg — ABNORMAL HIGH (ref 83.0–108.0)

## 2021-01-14 LAB — TSH: TSH: 3.017 u[IU]/mL (ref 0.350–4.500)

## 2021-01-14 NOTE — Progress Notes (Signed)
HD#1 Subjective:  Overnight Events: NAEOV  Patient was assessed in the ED. States she called the nurse at her facility because she was having increased SOB. She also reports that she is ready to die and wants to die peacefully. She reports being lonely and nothing to look forward to. States she is currently doing okay. She denies any chest pain, leg pain or palpitations.   Objective:  Vital signs in last 24 hours: Vitals:   01/14/21 1345 01/14/21 1415 01/14/21 1445 01/14/21 1546  BP: 139/61 125/64 (!) 148/69 (!) 154/87  Pulse: 84 87 87   Resp: (!) 24 (!) 25 (!) 24 19  Temp:    98.2 F (36.8 C)  TempSrc:    Oral  SpO2: 96% 95% 96% 98%  Weight:    67.2 kg  Height:    5\' 4"  (1.626 m)   Supplemental O2: Nasal Cannula SpO2: 96 % O2 Flow Rate (L/min): 2 L/min FiO2 (%): 100 %   Physical Exam:   General: Pleasant, well-appearing elderly woman laying in bed. No acute distress. Head: Normocephalic. Atraumatic. CV: RRR. III/VI early systolic murmur heard best at RUSB. 2+ BLE pitting edema Pulmonary: Tachypneic. Normal effort. Lungs clear on auscultation of anterior chest Abdominal: Soft, nontender, nondistended. Normal bowel sounds. Extremities: Palpable dorsalis pedis and radialis pulses. Normal ROM. Skin: Warm and dry. BLE with shiny skin 2/2 to edema.  Neuro: A&Ox3. Moves all extremities. Normal sensation. No focal deficit. Psych: Depressed mood  Filed Weights   01/13/21 1936  Weight: 65.8 kg    Intake/Output Summary (Last 24 hours) at 01/14/2021 1517 Last data filed at 01/14/2021 1028 Gross per 24 hour  Intake 750 ml  Output 2750 ml  Net -2000 ml   Net IO Since Admission: -2,000 mL [01/14/21 1517]  No results for input(s): GLUCAP in the last 72 hours.   Pertinent Labs: CBC Latest Ref Rng & Units 01/13/2021 01/13/2021 12/14/2020  WBC 4.0 - 10.5 K/uL - 5.7 6.8  Hemoglobin 12.0 - 15.0 g/dL 02/11/2021 20.1 00.7  Hematocrit 36.0 - 46.0 % 42.0 44.8 39.0  Platelets 150 - 400 K/uL -  288 233    CMP Latest Ref Rng & Units 01/14/2021 01/13/2021 01/13/2021  Glucose 70 - 99 mg/dL 03/13/2021) - 975(O)  BUN 8 - 23 mg/dL 9 - 9  Creatinine 832(P - 1.00 mg/dL 4.98 - 2.64  Sodium 1.58 - 145 mmol/L 139 139 139  Potassium 3.5 - 5.1 mmol/L 3.7 3.4(L) 3.8  Chloride 98 - 111 mmol/L 104 - 106  CO2 22 - 32 mmol/L 22 - 22  Calcium 8.9 - 10.3 mg/dL 309) - 8.9  Total Protein 6.5 - 8.1 g/dL - - -  Total Bilirubin 0.3 - 1.2 mg/dL - - -  Alkaline Phos 38 - 126 U/L - - -  AST 15 - 41 U/L - - -  ALT 0 - 44 U/L - - -    Imaging: DG Chest 2 View  Result Date: 01/13/2021 CLINICAL DATA:  87 year old female with shortness of breath. EXAM: CHEST - 2 VIEW COMPARISON:  Chest radiograph dated 12/10/2020. FINDINGS: Trace bilateral pleural effusions. There is diffuse interstitial coarsening, likely chronic. Mild edema is not excluded. No focal consolidation or pneumothorax. Interval clearing of the previously seen bilateral hazy pulmonary opacities. Stable cardiomediastinal silhouette. Atherosclerotic calcification of the aorta. Osteopenia with degenerative changes of the spine and shoulders. No acute osseous pathology. IMPRESSION: Trace bilateral pleural effusions. No focal consolidation. Electronically Signed   By: 12/12/2020  Radparvar M.D.   On: 01/13/2021 17:16   DG Chest Portable 1 View  Result Date: 01/13/2021 CLINICAL DATA:  Wheezing and shortness of breath. EXAM: PORTABLE CHEST 1 VIEW COMPARISON:  Radiograph earlier today.  Radiograph 12/10/2020 FINDINGS: Mild cardiomegaly is unchanged. Aortic atherosclerosis. Small pleural effusions is equivocally improved from prior. Mild pulmonary edema similar. No focal airspace disease. No pneumothorax. Chronic change about the left proximal humerus. IMPRESSION: 1. Stable cardiomegaly. Interstitial coarsening likely pulmonary edema. 2. Small pleural effusions are equivocally improved from radiograph 4 hours ago. Electronically Signed   By: Narda Rutherford M.D.   On:  01/13/2021 21:43    Assessment/Plan:   Active Problems:   Acute heart failure with preserved ejection fraction (HFpEF) (HCC)   Patient Summary: Raven Peterson is a 87 y.o. 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 Currently getting diuresis.   AHRF 2/2 Acute on chronic diastolic HF Patient with HFpEF presented with worsening SOB and found to be in respiratory distress and fluid overloaded state 2/2 to hearct failure exacerbation due to patient not getting diuretic at her facility. Respiratory status improved. Patient now on 2 LNC with sats above 95%. Still tachypneic. Continues to have LE edema but making appropriately urine output on current diuretic regimen. Net -I/O of -2 L since admission. Will continue diuresis and reassess daily --Continue IV lasix 40 mg BID --F/u repeat ECHO --Strict I&Os, daily weights --Daily BMPs   Hypertensive Emergency Blood pressure improved. BP with systolic in the 130-150s. Continue to monitor closely. Can consider adding another agent if BP continues to go up.  --Continue lisinopril 5 mg daily --Daily vitals  Passive Suicidal Ideation GOC conversation Patient states she is 89 and does not have much Eric let to do. She feels lonely and would like to die peacefully. Patient was informed of our plans to consult palliative care to continue conversation about her goals and wishes.  --Palliative care consulted, appreciate assistance  Possible Urinary Retention Making appropriate urine output. No signs of obstruction.  --Continue to monitor.  Hypothyroidism TSH normal on admission.  --Continue synthroid 25 mcg daily  Possible Shingles  Continue lidocaine patches daily PRN  Hx of Seizures  Patient denies recent hx of seizures. --Continue Vimpat 100 mg  daily  HLD Stable.  --Continue atorvastatin 40 mg daily  GERD Stable.  --Continue Protonix 40 mg daily  Diet: Heart healthy IVF: PO intake VTE: apixaban (ELIQUIS) tablet 2.5 mg Start: 01/13/21 2345apixaban (ELIQUIS) tablet 2.5 mg  Code: DNR PT/OT: None ID:  Anti-infectives (From admission, onward)   Start     Dose/Rate Route Frequency Ordered Stop   01/13/21 1945  valACYclovir (VALTREX) tablet 1,000 mg        1,000 mg Oral  Once 01/13/21 1937 01/13/21 2025   01/13/21 0000  valACYclovir (VALTREX) 1000 MG tablet        1,000 mg Oral 3 times daily 01/13/21 2057 01/20/21 2359       Anticipated discharge to Nursing Home in 1-2 days pending medical work up.  Steffanie Rainwater, MD 01/14/2021, 3:17 PM Pager: [phone] [facility]  Please contact the on call pager after 5 pm and on weekends at [phone].

## 2021-01-14 NOTE — Progress Notes (Signed)
RT obtained ABG on pt with the following results. Pt taken off of NIV/BIPAP and placed on [place] 4Lpm. Pt respiratory status is stable at this time w/no distress noted. RT will continue to monitor.   Results for Raven Peterson, Raven Peterson (MRN [id number]) as of 01/14/2021 00:24  Ref. Range 01/13/2021 23:58  Sample type Unknown ARTERIAL  pH, Arterial Latest Ref Range: 7.350 - 7.450  7.379  pCO2 arterial Latest Ref Range: 32.0 - 48.0 mmHg 39.2  pO2, Arterial Latest Ref Range: 83.0 - 108.0 mmHg 255 (H)  TCO2 Latest Ref Range: 22 - 32 mmol/L 24  Acid-base deficit Latest Ref Range: 0.0 - 2.0 mmol/L 2.0  Bicarbonate Latest Ref Range: 20.0 - 28.0 mmol/L 23.3  O2 Saturation Latest Units: % 100.0  Patient temperature Unknown 97.8 F  Collection site Unknown Radial

## 2021-01-14 NOTE — Procedures (Signed)
Echo attempted. Patient refused test, stating that "I just need a little oxygen."

## 2021-01-14 NOTE — ED Notes (Signed)
Bladder scanned 

## 2021-01-14 NOTE — H&P (Addendum)
Date: 01/14/2021               Patient Name:  Raven Peterson MRN: [id number]  DOB: 03/01/1922 Age / Sex: 87 y.o., female   PCP: Patient, No Pcp Per         Medical Service: Internal Medicine Teaching Service         Attending Physician: Dr. Erlinda Hong    First Contact: Dr. Kirke Corin Pager: [phone]  Second Contact: Dr. Ephriam Knuckles Pager: [phone]       After Hours (After 5p/  First Contact Pager: [phone]  weekends / holidays): Second Contact Pager: [phone]   Chief Complaint: Shortness of Breath  History of Present Illness:   Raven Peterson is a 87 y.o. 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 She states she has had ongoing, worsening SOB and bilateral lower extremity swelling (L > R) since her discharge from the hospital earlier this month. She notes chronic history of orthopnea and nocturnal cough. Also endorses severe fatigue recently with decreased appetite, cold intolerance, troubles urinating recently with incontinence, and low back pain. States she has troubles with shingles since last year and endorses bothersome painful, burning rash on her right upper back that she states has not been treated at her facility. Endorses redness and itchiness of her left lower leg over the past 3 days with chronic scaling, although denies pain, fevers, or chills. Denies nausea, vomiting, CP, abdominal pain, dysuria, hematuria, flank pain, constipation, or any other symptoms.   Home Medications: Eliquis 2.5mg  twice daily Atorvastatin 40mg  daily Lisinopril 5mg  daily Montelukast 10mg  nightly Tessalon 100mg  TID PRN Flonase 1 spray nightly  Levothyroxine 25 mcg daily  Lacosamide 100mg  twice daily Tylenol 500mg  QID plus PRN Hydrocodone-acetaminophen 5-325mg  TID  PRN Robaxin 500mg  twice daily PRN Omeprazole 20mg  nightly Miralax 17g daily Senna plus 1 capsule nightly Latanoprost 0.005% solution 1 drop both eyes nightly [place] protect ex 1 application PRN   Allergies: Allergies as of 01/13/2021 - Review Complete 01/13/2021  Allergen Reaction Noted  . Cimetidine Palpitations and Other (See Comments) 01/26/2016  . Naproxen sodium Other (See Comments) 01/26/2016  . Fluzone [influenza virus vaccine] Other (See Comments) 05/30/2019  . Fosamax [alendronate sodium] Other (See Comments) 05/30/2019  . Loratadine Other (See Comments) 05/30/2019  . Iodinated diagnostic agents Nausea Only 10/03/2014   Past Medical History:  Diagnosis Date  . Acute ischemic stroke (HCC) 10/02/2014   right cerebellar and left posterior temporal CVA: This was confirmed on MRI. We suspected an embolic source given the distribution on MRI. She has no history of atrial fib and no atrial fib was seen on telemetry. She was seen by neurology in consultation and underwent carotid 03/13/2021 which showed mild right carotid stenosis (<50%) and moderate left carotid stenosis (50-69%). She was treated with aspiri  . CAP (community acquired pneumonia) 03/17/2015  . Chronic back pain    "used to get shots in my back by a Pain Management doctor"  . Hypertension   . Inferior pubic ramus fracture, right, with routine healing, subsequent encounter   . Seizure (HCC) 10/02/2014  . Thyroid disease   . UTI from E.coli 2018  . Vertebral compression fracture (HCC)    Family History:  Family History  Problem Relation Age of Onset  . Healthy Mother   .  Asthma Father    Social History:  Patient lives at senior living facility.  Her husband died many years ago. She has four children. Her daughter lives in the area and checks on her frequently.  Denies history of tobacco use, alcohol use, or illicit drug use.   Past Surgical History:  Procedure Laterality Date  . CATARACT EXTRACTION, BILATERAL    .  CHOLECYSTECTOMY     Review of Systems: A complete ROS was negative except as per HPI.   Physical Exam: Blood pressure 122/70, pulse 92, temperature 97.8 F (36.6 C), temperature source Oral, resp. rate (!) 22, height 5\' 4"  (1.626 m), weight 65.8 kg, SpO2 97 %.  Initial Examination:   General: Patient appears uncomfortable in mild acute distress. Eyes: Sclera non-icteric. No conjunctival injection.  HENT: Neck is supple. No nasal discharge.  Respiratory: There are diffuse inspiratory > expiratory wheezes and crackles throughout all lung fields. Patient is tachypneic with increased work of breathing although saturating well on Bipap.  Cardiovascular: Regular rate and rhythm. No murmurs, rubs, or gallops, although heart sounds soft. 2+ LE pitting edema bilaterally.  Neurological: Awake, alert and oriented.  Musculoskeletal: Strength is 5/5 in all four extremities. Normal muscle bulk and tone.  Abdominal: Soft, non-distended, and not tender to palpation Bowel sounds intact. No rebound or guarding. Skin: Bandage overlying right upper back. Bilateral lower extremities are erythematous (L>R) and shiny, with mild scaling. No other lesions or rashes noted.  Psych: Normal affect. Pleasant and cooperative.  Changes on Subsequent Examination ~4:00am: General: Patient appears much more comfortable, in no acute distress.  Respiratory: Wheezing and crackles significantly improved throughout all lung fields. Remains mildly tachypneic although with minimal increased work of breathing on 4L [place].  Cardiovascular: There are diffuse 2-3/6 early systolic murmurs heard throughout the chest, loudest in the right upper chest.  Abdominal: There is mild, diffuse tenderness to palpation, worst in the LLQ, although soft and without involuntary guarding or rebound. Bowel sounds intact. (states she actively needs to urinate) Psych: Endorses current passive SI.   EKG: personally reviewed my interpretation is NSR at 96  bpm. No acute ST or T wave changes to suggest acute ischemia. Consistent with prior EKG's.   CXR (2 view and Portable): personally reviewed my interpretation is mild, diffuse interstitial edema consistent with CHF exacerbation with very small bilateral pleural effusions and atherosclerotic calcifications of the ascending and descending aorta.   Assessment & Plan by Problem: Active Problems:   Acute heart failure with preserved ejection fraction (HFpEF) (HCC)  # Acute Hypoxic Respiratory Failure  Patient has had worsening SOB for about 1 month, s/p discharge 12/14/20 for COVID-19 PNA and HFpEF exacerbation. Initially in the ED she was not in any acute distress without tachypnea, saturating in the 90's on room air with clear lung exam. However, while in the ED she developed acute-onset hypoxia with inspiratory wheezing and rales with tachypnea and increased WOB, requiring [place] and transitioned to BIPAP. ABG unremarkable. D-dimer 0.63. Has contrast allergy, unable to obtain CTA although D-dimer corrects for age. Suspect this was due to flash pulmonary edema in the setting of HTN emergency and CHF, although CXR largely unchanged.  - Continuous pulse oximetry with supplemental O2 as needed for SpO2 >92%  - Continue home montelukast 10mg  nightly  - Continue home Flonase nightly   # Acute on Chronic Diastolic CHF Exacerbation Patient has had worsening SOB and bilateral LE swelling with chronic venous stasis changes since discharge from the hospital 12/14/20, where  she required IV diuresis. She is not on any diuretics at her facility. Last ECHO 05/2019 showed hyperdynamic LV function with normal RV systolic function. Troponins 121 > 122, BNP 369.2, both significantly lower than on previous admission last month. CXR with mild interstitial edema. Patient was given Lasix 40mg  IV x 1 in the ED.  - Continue Lasix 40mg  IV twice daily  - Check TTE  - Check BMP  - Daily weights  - Strict I&O  - Continuous telemetry    # Hypertensive Emergency Patient's blood pressures were elevated in the 200's systolic in the ED. She was given labetalol 10mg  IV and furosemide 40mg  IV. Pressures over-corrected quickly with NTG gtt. She does have a history of HTN treated with lisinopril 5mg  daily at home. Suspect HTN is largely due to fluid overload and flash edema given improvements concurrent with improved respiratory status.  - Continue lisinopril 5mg  daily  - Continue to monitor   # Passive Suicidal Ideation Patient states that she has been increasingly lonely since the death of her husband >10 years ago. She states she is "ready to go" and seems eager to die at this point. States she did have active suicidal ideation when diagnosed with COVID-19, although more recently has not had active thoughts of harming herself. Has decision making capacity and would like to remain DNR. Denies hx of depression.   - Consider palliative care consult   # Possible Urinary Retention Patient endorses increasing difficulties initiating urination recently, stating she needs to stand to urinate. Endorses intermittent urinary incontinence recently. Denies dysuria, hematuria. No CVA tenderness on examination.   - External Catheter in place  - Check post-void residual to r/o retention   # Hypothyroidism Patient endorses fatigue, cold intolerance, dry skin, and decreased appetite, presenting with CHF exacerbation.   - Continue home levothyroxine daily - Check TSH   # Possible Shingles  Endorses chronic pain and burning over right upper back without obvious rash, most consistent with post-herpetic neuralgia. Was given Valtrex 1000mg  once. Pain resolved with lidocaine 5% patch.   - Continue lidocaine patches daily PRN  - Evaluate on morning exam  # Hx of Seizures  Patient denies any recent history of seizures.   - Continue home Vimpat 100mg  twice dialy   # HLD  - Continue home atorvastatin 40mg  daily   # GERD  - Continue  Protonix 40mg  daily   Code Status: DNR Diet: Heart  DVT PPx: On Eliquis 2.5mg  BID chronically  IVF: None  Dispo: Admit patient to Inpatient with expected length of stay greater than 2 midnights.  Signed: , MD 01/14/2021, 2:52 AM  Pager: [phone] After 5pm on weekdays and 1pm on weekends: On Call pager: [phone]

## 2021-01-14 NOTE — ED Notes (Signed)
SDU Breakfast Ordered 

## 2021-01-15 DIAGNOSIS — R0603 Acute respiratory distress: Secondary | ICD-10-CM

## 2021-01-15 DIAGNOSIS — I5031 Acute diastolic (congestive) heart failure: Secondary | ICD-10-CM

## 2021-01-15 DIAGNOSIS — Z7189 Other specified counseling: Secondary | ICD-10-CM

## 2021-01-15 DIAGNOSIS — J810 Acute pulmonary edema: Secondary | ICD-10-CM

## 2021-01-15 DIAGNOSIS — Z515 Encounter for palliative care: Secondary | ICD-10-CM

## 2021-01-15 DIAGNOSIS — Z66 Do not resuscitate: Secondary | ICD-10-CM

## 2021-01-15 LAB — MAGNESIUM: Magnesium: 1.8 mg/dL (ref 1.7–2.4)

## 2021-01-15 LAB — BASIC METABOLIC PANEL
Anion gap: 12 (ref 5–15)
BUN: 13 mg/dL (ref 8–23)
CO2: 29 mmol/L (ref 22–32)
Calcium: 8.7 mg/dL — ABNORMAL LOW (ref 8.9–10.3)
Chloride: 98 mmol/L (ref 98–111)
Creatinine, Ser: 1.01 mg/dL — ABNORMAL HIGH (ref 0.44–1.00)
GFR, Estimated: 51 mL/min — ABNORMAL LOW (ref >60)
Glucose, Bld: 121 mg/dL — ABNORMAL HIGH (ref 70–99)
Potassium: 3.2 mmol/L — ABNORMAL LOW (ref 3.5–5.1)
Sodium: 139 mmol/L (ref 135–145)

## 2021-01-15 MED ORDER — MAGNESIUM SULFATE 2 GM/50ML IV SOLN
2.0000 g | Freq: Once | INTRAVENOUS | Status: AC
  Administered 2021-01-15: 2 g via INTRAVENOUS
  Filled 2021-01-15: qty 50

## 2021-01-15 MED ORDER — POTASSIUM CHLORIDE CRYS ER 20 MEQ PO TBCR
40.0000 meq | EXTENDED_RELEASE_TABLET | Freq: Two times a day (BID) | ORAL | Status: AC
  Administered 2021-01-15 (×2): 40 meq via ORAL
  Filled 2021-01-15 (×2): qty 2

## 2021-01-15 NOTE — Progress Notes (Signed)
Civil engineer, contracting [facility])  Received request from PMT for hospice services at home after discharge.  Chart and pt information under review by [facility] physician.  Hospice eligibility pending at this time.  Hospital liaison left a message with daughter [name] to initiate education related to hospice philosophy and services and to offer to answer any questions at this time.  [facility] liaisons will follow up with pt and family tomorrow.  Per Amil Amen, NP, pt will need hospital bed, overbed table, supplemental O2, and BSC.    [facility] information and contact numbers left in voicemail for daughter [name].  Please call with any questions or concerns.  Thank you for the opportunity to participate in this pt's care.  Gillian Scarce, BSN, RN [employer] [phone] (24h on call)

## 2021-01-15 NOTE — Consult Note (Signed)
Consultation Note Date: 01/15/2021   Patient Name: Raven Peterson  DOB: 1922-05-29  MRN: [id number]  Age / Sex: 87 y.o., female  PCP: Patient, No Pcp Per Referring Physician: Axel Filler, *  Reason for Consultation: Establishing goals of care  HPI/Patient Profile: 87 y.o. 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 She was recently hospitalized 12/31-1/4 for hypervolemia and CIVOD-19 PNA . She presented to the emergency department on 01/13/2021 with shortness of breath. She reported ongoing worsening shortness of breath and bilateral lower extremity swelling since her discharge from the hospital earlier this month.  In the ED, she developed acute onset respiratory distress with hypoxia and inspiratory wheezing and rales. She was placed on [place] and then BiPAP. Chest x-ray showed mild diffuse interstitial edema consistent with CHF exacerbation with very small bilateral pleural effusions and atherosclerotic calcifications of the ascending and descending aorta. She was admitted to Internal Medicine TS with acute hypoxic respiratory failure and acute on chronic diastolic CHF exacerbation, and hypertensive emergency.   Clinical Assessment and Goals of Care: I have reviewed medical records including EPIC notes, labs and imaging, examined the patient and met at bedside with patient and daughter [name] to discuss diagnosis, prognosis, GOC, EOL wishes, disposition, and options.  I introduced Palliative Medicine as specialized medical care for people living with serious illness. It focuses on providing relief from the symptoms and stress of a serious illness.   We discussed a brief life review of the patient. She was born in [place]. After immigrating to the [place], she spent most of her life in [place]. She has 4 children: 1 son in  [place], 1 son in [place], and 1 daughter in a group home in [place]. Her youngest daughter [name] lives in [place]. Her husband passed away 20 years ago.   Patient has lived at [facility] since October 2017.  As far as functional status, at baseline she is ambulatory with a walker, independent with most ADLs although does require assistance with bathing. [name] states they "pay extra" at [facility] to provide her mother with extra help.   We discussed her current illness and what it means in the larger context of her ongoing co-morbidities.  Natural disease trajectory of heart failure was discussed, emphasizing that it is progressive disease process with recurrent exacerbations.   Patient has expressed that she is ready to die and that she misses her husband. She laments that she is not able to do the things she used to.   The difference between aggressive medical intervention and comfort care was considered in light of the patient's goals of care.  I introduced the concept of a comfort path to patient and daughter and encouraged them  To focus on helping the patient feel as best she can for as long as she can with the goal of comfort rather than prolonging life. Introduced hospice philosophy and provided information on home hospice services.   Discussed that hospice  services would be beneficial for patient by promoting quality of life, managing her exacerbation symptoms when they occur, and avoiding hospitalization. Patient and daughter agree.    Questions and concerns were addressed. The family was encouraged to call with questions or concerns.   Primary decision maker: Patient.  Patient states she would want her surrogate decision maker to be [name] [phone]    SUMMARY OF RECOMMENDATIONS    DNR/DNI as previously documented  Continue current medical care  Plan is for back to [place] with hospice when patient is medically stable - family prefers  [facility] and I have spoken with their liaison  Would discharge patient with oxygen to use prn (for comfort)  PMT will continue to follow  Code Status/Advance Care Planning:  DNR  Palliative Prophylaxis:   Frequent Pain Assessment and Turn Reposition  Additional Recommendations (Limitations, Scope, Preferences):  No aggressive interventions or life-prolonging measures  Psycho-social/Spiritual:   Created space and opportunity for patient and family to express thoughts and feelings regarding patient's current medical situation.   Emotional support provided   Prognosis:   < 6 months  Discharge Planning: Home (back to [place]) with hospice     Primary Diagnoses: Present on Admission: . Acute heart failure with preserved ejection fraction (HFpEF) ([place])   I have reviewed the medical record, interviewed the patient and family, and examined the patient. The following aspects are pertinent.  Past Medical History:  Diagnosis Date  . Acute ischemic stroke ([place]) 10/02/2014   right cerebellar and left posterior temporal CVA: This was confirmed on MRI. We suspected an embolic source given the distribution on MRI. She has no history of atrial fib and no atrial fib was seen on telemetry. She was seen by neurology in consultation and underwent carotid US which showed mild right carotid stenosis (<50%) and moderate left carotid stenosis (50-69%). She was treated with aspiri  . CAP (community acquired pneumonia) 03/17/2015  . Chronic back pain    "used to get shots in my back by a Pain Management doctor"  . Hypertension   . Inferior pubic ramus fracture, right, with routine healing, subsequent encounter   . Seizure ([place]) 10/02/2014  . Thyroid disease   . UTI from E.coli 2018  . Vertebral compression fracture (HCC)     Family History  Problem Relation Age of Onset  . Healthy Mother   . Asthma Father    Scheduled Meds: . apixaban  2.5 mg Oral BID  . atorvastatin  40 mg Oral q1800   . fluticasone  1 spray Each Nare QHS  . furosemide  40 mg Intravenous BID  . labetalol  10 mg Intravenous Once  . lacosamide  100 mg Oral BID  . latanoprost  1 drop Both Eyes Q48H  . levothyroxine  25 mcg Oral QAC breakfast  . lidocaine  1 patch Transdermal Q24H  . lisinopril  5 mg Oral Daily  . montelukast  10 mg Oral QHS  . pantoprazole  40 mg Oral Daily  . polyethylene glycol  17 g Oral Daily  . potassium chloride  40 mEq Oral BID  . senna-docusate  1 tablet Oral QHS  . sodium chloride flush  3 mL Intravenous Q12H   Continuous Infusions: . sodium chloride    . magnesium sulfate bolus IVPB 2 g (01/15/21 1211)   PRN Meds:.sodium chloride, acetaminophen, liver oil-zinc oxide, methocarbamol, ondansetron (ZOFRAN) IV, sodium chloride flush Medications Prior to Admission:  Prior to Admission medications   Medication Sig Start  Date End Date Taking? Authorizing Provider  acetaminophen (TYLENOL) 500 MG tablet Take 500-1,000 mg by mouth See admin instructions. Take 500 mg by mouth at 8 AM and 8 PM, 1,000 mg at 2 PM, and an additional 500 mg once a day as needed for pain   Yes [provider]  apixaban (ELIQUIS) 2.5 MG TABS tablet Take 2.5 mg by mouth in the morning and at bedtime. Reported on 04/03/2016 06/16/15 01/13/21 Yes [provider]  atorvastatin (LIPITOR) 40 MG tablet Take 40 mg by mouth daily.   Yes [provider]  benzonatate (TESSALON) 100 MG capsule Take 1 capsule (100 mg total) by mouth every 8 (eight) hours. Patient taking differently: Take 100 mg by mouth every 8 (eight) hours as needed for cough. 07/07/18  Yes Burky, Malachy Moan, NP  fluticasone (FLONASE) 50 MCG/ACT nasal spray Place 1 spray into both nostrils at bedtime.   Yes [provider]  HYDROcodone-acetaminophen (NORCO/VICODIN) 5-325 MG tablet Take 1 tablet by mouth 3 (three) times daily as needed for moderate pain. Patient taking differently: Take 1 tablet by mouth 3 (three) times daily.  06/03/19  Yes Arrien, Jimmy Picket, MD  Lacosamide 100 MG TABS Take 100 mg by mouth in the morning and at bedtime. 07/13/15 01/13/21 Yes [provider]  latanoprost (XALATAN) 0.005 % ophthalmic solution Place 1 drop into both eyes See admin instructions. Instill 1 drop into both eyes every other night at bedtime   Yes [provider]  levothyroxine (SYNTHROID, LEVOTHROID) 25 MCG tablet Take 25 mcg by mouth daily before breakfast.  07/13/15  Yes [provider]  lisinopril (PRINIVIL,ZESTRIL) 5 MG tablet Take 5 mg by mouth daily.    Yes [provider]  methocarbamol (ROBAXIN) 500 MG tablet Take 500 mg by mouth 2 (two) times daily as needed for muscle spasms.   Yes [provider]  montelukast (SINGULAIR) 10 MG tablet Take 10 mg by mouth at bedtime.   Yes [provider]  omeprazole (PRILOSEC) 20 MG capsule Take 20 mg by mouth at bedtime.   Yes [provider]  polyethylene glycol (MIRALAX / GLYCOLAX) 17 g packet Take 17 g by mouth daily. 06/03/19  Yes Arrien, Jimmy Picket, MD  SENNA PLUS 50-8.6 MG CAPS Take 1 capsule by mouth at bedtime. 12/20/20  Yes [provider]  Skin Protectants, Misc. (BAZA PROTECT EX) Apply 1 application topically as needed (to buttocks- for redness).   Yes [provider]  valACYclovir (VALTREX) 1000 MG tablet Take 1 tablet (1,000 mg total) by mouth 3 (three) times daily for 7 days. 01/13/21 01/20/21 Yes Camila Li, MD   Allergies  Allergen Reactions  . Cimetidine Palpitations and Other (See Comments)    "Allergic," per MAR  . Naproxen Sodium Other (See Comments)    Affects glaucoma medication  . Fluzone [Influenza Virus Vaccine] Other (See Comments)    "Allergic," per MAR  . Fosamax [Alendronate Sodium] Other (See Comments)    "Allergic," per MAR  . Loratadine Other (See Comments)    "Allergic," per MAR  . Iodinated Diagnostic Agents Nausea Only    Physical Exam Vitals reviewed.   Constitutional:      General: She is not in acute distress.    Comments: Frail, chronically ill-appearing  Cardiovascular:     Rate and Rhythm: Normal rate.  Pulmonary:     Effort: Pulmonary effort is normal.  Skin:    General: Skin is warm and dry.  Neurological:  Mental Status: She is alert and oriented to person, place, and time.     Vital Signs: BP 114/65 (BP Location: Right Arm)   Pulse 80   Temp 98.4 F (36.9 C) (Oral)   Resp (!) 22   Ht $R'5\' 4"'Ey$  (1.626 m)   Wt 66.4 kg   SpO2 96%   BMI 25.13 kg/m  Pain Scale: 0-10   Pain Score: 0-No pain   SpO2: SpO2: 96 % O2 Device:SpO2: 96 % O2 Flow Rate: .O2 Flow Rate (L/min): 2 L/min  IO: Intake/output summary:   Intake/Output Summary (Last 24 hours) at 01/15/2021 1300 Last data filed at 01/14/2021 2207 Gross per 24 hour  Intake 423 ml  Output 1200 ml  Net -777 ml    LBM: Last BM Date: 01/15/21 Baseline Weight: Weight: 65.8 kg Most recent weight: Weight: 66.4 kg      Palliative Assessment/Data: PPS 40%     Time In: 13:00 Time Out: 14:12 Time Total: 72 minutes Greater than 50%  of this time was spent counseling and coordinating care related to the above assessment and plan.  Signed by: Lavena Bullion, NP   Please contact Palliative Medicine Team phone at [phone] for questions and concerns.  For individual provider: See Shea Evans

## 2021-01-15 NOTE — Progress Notes (Signed)
Pt refused to go on CPAP tonight. Pt VS stable. Will continue to monitor.

## 2021-01-15 NOTE — Progress Notes (Signed)
    HD#2 Subjective:   Refused echocardiogram yesterday stating she just wanted to sleep.  She does not have any complaints this morning. Notes that she doesn't want to be in the hospital however she does want [place] to treat whatever is making her [place] of breath, which we discussed was related to her heart failure. Discussed that we have asked palliative care to come around to discuss GOC with her.  Objective:  Vital signs in last 24 hours: Vitals:   01/14/21 1546 01/14/21 2028 01/15/21 0034 01/15/21 0335  BP: (!) 154/87 128/76 (!) 104/50 (!) 145/59  Pulse:  91 82 80  Resp: 19 (!) 22 (!) 23 (!) 21  Temp: 98.2 F (36.8 C) 98.6 F (37 C) 98.4 F (36.9 C) 97.8 F (36.6 C)  TempSrc: Oral Oral Oral Oral  SpO2: 98% 96% 91% 95%  Weight: 67.2 kg  66.4 kg   Height: 5\' 4"  (1.626 m)      Supplemental O2: Nasal Cannula SpO2: 95 % O2 Flow Rate (L/min): 2 L/min FiO2 (%): 100 %   Physical Exam:   General: well appearing  Cardiac: regular rate. +2 pitting edema in the bilateral lower extremities extending above the knees Pulm: breathing comfortably on 2L supplemental oxygen. Lungs clear anteriorly   [facility] Weights   01/13/21 1936 01/14/21 1546 01/15/21 0034  Weight: 65.8 kg 67.2 kg 66.4 kg    Intake/Output Summary (Last 24 hours) at 01/15/2021 0525 Last data filed at 01/14/2021 2207 Gross per 24 hour  Intake 423 ml  Output 3050 ml  Net -2627 ml   Net IO Since Admission: -2,777 mL [01/15/21 0525]   Pertinent Labs: BMP Latest Ref Rng & Units 01/15/2021 01/14/2021 01/13/2021  Glucose 70 - 99 mg/dL 03/13/2021) 562(Z) -  BUN 8 - 23 mg/dL 13 9 -  Creatinine 308(M - 1.00 mg/dL 5.78) 4.69(G -  Sodium 135 - 145 mmol/L 139 139 139  Potassium 3.5 - 5.1 mmol/L 3.2(L) 3.7 3.4(L)  Chloride 98 - 111 mmol/L 98 104 -  CO2 22 - 32 mmol/L 29 22 -  Calcium 8.9 - 10.3 mg/dL 2.95) 2.8(U) -     Assessment/Plan:   Active Problems:   Acute heart failure with preserved ejection fraction (HFpEF)  (HCC)   Patient Summary: Raven Peterson is a 87 y.o. 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 Currently getting diuresis.   AHRF 2/2 Acute on chronic diastolic HF -pt declines echocardiogram this admission -remains on 2L [place] -good UOP yesterday. Net -2.7L since admission -K 3.2 this morning Plan --Continue IV lasix 40 mg BID --replete electrolytes. Maintain K>4, Mag >2 --Strict I&Os, daily weights, tele monitoring --Daily BMPs, Mg   Goals of care. Palliative care consulted 2/4.   Hypothyroidism. Continue synthroid Hx of Seizures. Continue vimpat HLD. Continue atorvastatin 40 mg daily GERD. Continue protonix 40mg  daily COPD: continuesingulair   Diet: Heart healthy IVF: none VTE: apixaban (ELIQUIS) tablet 2.5 mg Start: 01/13/21 2345apixaban (ELIQUIS) tablet 2.5 mg  Code: DNR PT/OT: None  Dispo: suspect discharge in 2-3 days  , MD Internal Medicine Resident PGY-2 03/13/21 Internal Medicine Residency Pager: [phone] 01/15/2021 10:28 AM    Please contact the on call pager after 5 pm and on weekends at [phone].

## 2021-01-15 NOTE — Evaluation (Signed)
Physical Therapy Evaluation Patient Details Name: Raven Peterson MRN: [id number] DOB: [birth year] Today's Date: 01/15/2021   History of Present Illness  The pt is a 87 yo female presenting from [facility] with shortness of breath due to acute exacerbation of HF. PMH includes: HFpEF, hypothyroidism, HTN, HLD, CAP, ischemic stroke (2015), seizure, UTI, and recent admission for volume overload and COVID 19.    Clinical Impression  Pt in bed upon arrival of PT, agreeable to evaluation at this time. Prior to admission the pt was mobilizing with use of rollator in her apartment at [facility], receiving assist for ADLs as needed by staff. The pt now presents with limitations in functional mobility, strength, power, endurance, and stability due to above dx, and will continue to benefit from skilled PT to address these deficits. The pt was able to demo good capacity for bed mobility and seated stability, requiring only minA to supervision at times. However, the pt demos more significant deficits in OOB mobility, requiring minA to complete sit-stand and [place] ambulation in the room, even with RW. The pt was previously mobilizing on modified independent level, and will benefit from continued therapy to return to pt's desired level of independence with transfers and mobility.      Follow Up Recommendations Home health PT    Equipment Recommendations   (pt had needed equipment)    Recommendations for Other Services       Precautions / Restrictions Precautions Precautions: Fall Precaution Comments: watch O2 Restrictions Weight Bearing Restrictions: No      Mobility  Bed Mobility Overal bed mobility: Needs Assistance Bed Mobility: Supine to Sit;Sit to Supine     Supine to sit: Min guard Sit to supine: Min guard   General bed mobility comments: ming with cues for use of bed rail and slighly increased time    Transfers Overall transfer level: Needs assistance Equipment  used: Rolling walker (2 wheeled) Transfers: Sit to/from Stand Sit to Stand: Min assist         General transfer comment: minA to power up and steady, increased time needed to complete movement. pt steady with BUE support  Ambulation/Gait Ambulation/Gait assistance: Min guard Gait Distance (Feet): 15 Feet Assistive device: Rolling walker (2 wheeled) Gait Pattern/deviations: Step-through pattern;Decreased stride length;Trunk flexed Gait velocity: decreased Gait velocity interpretation: <1.31 ft/sec, indicative of household ambulator General Gait Details: sloed gait with trunk flexed. reliant on BUE support, but able to nagivate around objects in room with RW. PT for minG and line management.      Balance Overall balance assessment: Needs assistance Sitting-balance support: No upper extremity supported;Feet supported Sitting balance-Leahy Scale: Fair     Standing balance support: Bilateral upper extremity supported Standing balance-Leahy Scale: Poor                               Pertinent Vitals/Pain Pain Assessment: No/denies pain    Home Living Family/patient expects to be discharged to:: Assisted living               Home Equipment: Walker - 4 wheels;Shower seat;Hand held shower head Additional Comments: [place]    Prior Function Level of Independence: Needs assistance   Gait / Transfers Assistance Needed: rollator for walking in home and to meals (pt states 4 min walk to meal)  ADL's / Homemaking Assistance Needed: Assist for bathe/dressing; iADLs provided for pt  Comments: 1 daughter in area  Hand Dominance   Dominant Hand: Right    Extremity/Trunk Assessment   Upper Extremity Assessment Upper Extremity Assessment: Generalized weakness    Lower Extremity Assessment Lower Extremity Assessment: Generalized weakness    Cervical / Trunk Assessment Cervical / Trunk Assessment: Kyphotic  Communication   Communication: HOH   Cognition Arousal/Alertness: Awake/alert Behavior During Therapy: WFL for tasks assessed/performed Overall Cognitive Status: Within Functional Limits for tasks assessed                                 General Comments: slight decreased insight to safety, but able to follow all commands and mobilize with good awareness in surroundings without cues.      General Comments General comments (skin integrity, edema, etc.): VSS on RA, reduced from 2L O2 to RA with SpO2 at 100%, RN notified    Exercises     Assessment/Plan    PT Assessment Patient needs continued PT services  PT Problem List Decreased strength;Decreased activity tolerance;Decreased balance;Decreased mobility;Decreased coordination       PT Treatment Interventions DME instruction;Gait training;Functional mobility training;Therapeutic activities;Therapeutic exercise;Balance training;Patient/family education    PT Goals (Current goals can be found in the Care Plan section)  Acute Rehab PT Goals Patient Stated Goal: return home without therapy PT Goal Formulation: With patient Time For Goal Achievement: 01/29/21 Potential to Achieve Goals: Good    Frequency Min 3X/week    AM-PAC PT "6 Clicks" Mobility  Outcome Measure Help needed turning from your back to your side while in a flat bed without using bedrails?: A Little Help needed moving from lying on your back to sitting on the side of a flat bed without using bedrails?: A Little Help needed moving to and from a bed to a chair (including a wheelchair)?: A Little Help needed standing up from a chair using your arms (e.g., wheelchair or bedside chair)?: A Little Help needed to walk in hospital room?: A Little Help needed climbing 3-5 steps with a railing? : A Lot 6 Click Score: 17    End of Session Equipment Utilized During Treatment: Gait belt Activity Tolerance: Patient tolerated treatment well Patient left: in bed;with call bell/phone within  reach;with bed alarm set;with nursing/sitter in room;with family/visitor present Nurse Communication: Mobility status PT Visit Diagnosis: Unsteadiness on feet (R26.81);Muscle weakness (generalized) (M62.81)    Time: 0093-8182 PT Time Calculation (min) (ACUTE ONLY): 28 min   Charges:   PT Evaluation $PT Eval Low Complexity: 1 Low PT Treatments $Gait Training: 8-22 mins        Rolm Baptise, PT, DPT   Acute Rehabilitation Department Pager #: [phone]  Gaetana Michaelis 01/15/2021, 6:13 PM

## 2021-01-16 DIAGNOSIS — L8990 Pressure ulcer of unspecified site, unspecified stage: Secondary | ICD-10-CM | POA: Insufficient documentation

## 2021-01-16 LAB — CBC
HCT: 39.5 % (ref 36.0–46.0)
Hemoglobin: 13.6 g/dL (ref 12.0–15.0)
MCH: 33.7 pg (ref 26.0–34.0)
MCHC: 34.4 g/dL (ref 30.0–36.0)
MCV: 97.8 fL (ref 80.0–100.0)
Platelets: 259 10*3/uL (ref 150–400)
RBC: 4.04 MIL/uL (ref 3.87–5.11)
RDW: 13.7 % (ref 11.5–15.5)
WBC: 7 10*3/uL (ref 4.0–10.5)
nRBC: 0 % (ref 0.0–0.2)

## 2021-01-16 LAB — BASIC METABOLIC PANEL
Anion gap: 14 (ref 5–15)
BUN: 15 mg/dL (ref 8–23)
CO2: 27 mmol/L (ref 22–32)
Calcium: 8.5 mg/dL — ABNORMAL LOW (ref 8.9–10.3)
Chloride: 99 mmol/L (ref 98–111)
Creatinine, Ser: 1.09 mg/dL — ABNORMAL HIGH (ref 0.44–1.00)
GFR, Estimated: 46 mL/min — ABNORMAL LOW (ref >60)
Glucose, Bld: 119 mg/dL — ABNORMAL HIGH (ref 70–99)
Potassium: 4.5 mmol/L (ref 3.5–5.1)
Sodium: 140 mmol/L (ref 135–145)

## 2021-01-16 LAB — MAGNESIUM: Magnesium: 2 mg/dL (ref 1.7–2.4)

## 2021-01-16 MED ORDER — FUROSEMIDE 10 MG/ML IJ SOLN
40.0000 mg | Freq: Every day | INTRAMUSCULAR | Status: DC
  Administered 2021-01-17: 40 mg via INTRAVENOUS
  Filled 2021-01-16: qty 4

## 2021-01-16 NOTE — Progress Notes (Signed)
HD#3 Subjective:  Overnight Events: She refused CPAP overnight.   This AM, patient states she is doing okay. She denies any CP, palpitations or worsening SOB. She endorse some mild pain in her legs.   Objective:  Vital signs in last 24 hours: Vitals:   01/15/21 1500 01/15/21 2000 01/16/21 0100 01/16/21 0513  BP: (!) 138/58 (!) 130/52 (!) 110/45 (!) 131/57  Pulse: 83 81 83 85  Resp: (!) 24 20 16  (!) 21  Temp: 98 F (36.7 C) 99.1 F (37.3 C)  97.9 F (36.6 C)  TempSrc: Oral Oral    SpO2: 96% 93% 92% 96%  Weight:    66.3 kg  Height:       Supplemental O2: Nasal Cannula SpO2: 96 % O2 Flow Rate (L/min): 2 L/min FiO2 (%): 28 %   Physical Exam:   General: Pleasant elderly woman sitting in bed. NAD CV: RRR. III/VI early systolic murmur heard best at RUSB. Trace BLE edema.  Pulmonary: On 2LNC. Lung CTAB. No wheezing or rales. Abdominal: Soft. NT/ND. Normal BS.  Extremities: 2+ distal pulses. Normal ROM Skin: Warm and dry. Skin with wrinkles s/p diuresis.  Neuro: A&Ox3. No focal deficits.   Filed Weights   01/14/21 1546 01/15/21 0034 01/16/21 0513  Weight: 67.2 kg 66.4 kg 66.3 kg    Intake/Output Summary (Last 24 hours) at 01/16/2021 0724 Last data filed at 01/16/2021 0500 Gross per 24 hour  Intake 480 ml  Output 1800 ml  Net -1320 ml   Net IO Since Admission: -4,097 mL [01/16/21 0724]  No results for input(s): GLUCAP in the last 72 hours.   Pertinent Labs: CBC Latest Ref Rng & Units 01/16/2021 01/13/2021 01/13/2021  WBC 4.0 - 10.5 K/uL 7.0 - 5.7  Hemoglobin 12.0 - 15.0 g/dL 03/13/2021 38.1 82.9  Hematocrit 36.0 - 46.0 % 39.5 42.0 44.8  Platelets 150 - 400 K/uL 259 - 288    CMP Latest Ref Rng & Units 01/16/2021 01/15/2021 01/14/2021  Glucose 70 - 99 mg/dL 03/14/2021) 169(C) 789(F)  BUN 8 - 23 mg/dL 15 13 9   Creatinine 0.44 - 1.00 mg/dL 810(F) ) 7.51(W  Sodium 135 - 145 mmol/L 140 139 139  Potassium 3.5 - 5.1 mmol/L 4.5 3.2(L) 3.7  Chloride 98 - 111 mmol/L 99 98 104  CO2 22  - 32 mmol/L 27 29 22   Calcium 8.9 - 10.3 mg/dL 2.58(N) 2.77) )  Total Protein 6.5 - 8.1 g/dL - - -  Total Bilirubin 0.3 - 1.2 mg/dL - - -  Alkaline Phos 38 - 126 U/L - - -  AST 15 - 41 U/L - - -  ALT 0 - 44 U/L - - -    Imaging: No results found.  Assessment/Plan:   Active Problems:   Acute heart failure with preserved ejection fraction (HFpEF) (HCC)   Patient Summary: Raven Peterson is a 87 y.o. 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 Currently getting diuresis. Plan for discharge to her assisted living facility with home hospice.  AHRF 2/2 Acute on chronic diastolic HF Respiratory status improved. O2 sats above 95% on 2 L [place]. Denies chest pain or palpitations. States shortness of breath is better. Making good UOP w/ net I/O -4 L since admission. Kidney function stable. --Decrease IV Lasix from 40 BID to 40 mg daily --Strict  I&Os  --Daily weights --Daily BMPs  Goals of care Palliative care consulted to assist with goals of care conversation. Patient and daughter agreeable to home with hospice.  --Palliative care following, appreciate assistance --Plan for discharge to [facility] with hospice once medically stable --Home oxygen prn for comfort --DNR  Hypertensive Emergency, resolved BP improved on current antihypertensive regimen. Current BP 118/57.  --Continue lisinopril 5 mg daily --Daily vitals  Hypothyroidism: TSH normal. Continue Synthroid 25 mcg daily Possible Shingles: Continue lidocaine patches daily PRN Hx of Seizures: Continue Vimpat 100 mg daily HLD: Stable and chronic. Continue atorvastatin 40 mg daily GERD: Stable and chronic. Continue Protonix 40 mg daily  Diet: Heart healthy IVF: PO intake VTE: apixaban (ELIQUIS) tablet 2.5 mg Start: 01/13/21 2345apixaban  (ELIQUIS) tablet 2.5 mg  Code: DNR PT/OT: None ID:  Anti-infectives (From admission, onward)   Start     Dose/Rate Route Frequency Ordered Stop   01/13/21 1945  valACYclovir (VALTREX) tablet 1,000 mg        1,000 mg Oral  Once 01/13/21 1937 01/13/21 2025   01/13/21 0000  valACYclovir (VALTREX) 1000 MG tablet        1,000 mg Oral 3 times daily 01/13/21 2057 01/20/21 2359      Anticipated discharge to Nursing Home in 1-2 days pending diuresis and improvement in respiratory status.  Raven Rainwater, MD 01/16/2021, 7:24 AM Pager: [phone] [facility]  Please contact the on call pager after 5 pm and on weekends at [phone].

## 2021-01-17 ENCOUNTER — Encounter (HOSPITAL_COMMUNITY): Payer: Self-pay | Admitting: Internal Medicine

## 2021-01-17 LAB — BASIC METABOLIC PANEL
Anion gap: 12 (ref 5–15)
BUN: 22 mg/dL (ref 8–23)
CO2: 26 mmol/L (ref 22–32)
Calcium: 8.1 mg/dL — ABNORMAL LOW (ref 8.9–10.3)
Chloride: 95 mmol/L — ABNORMAL LOW (ref 98–111)
Creatinine, Ser: 1.31 mg/dL — ABNORMAL HIGH (ref 0.44–1.00)
GFR, Estimated: 37 mL/min — ABNORMAL LOW (ref >60)
Glucose, Bld: 112 mg/dL — ABNORMAL HIGH (ref 70–99)
Potassium: 4 mmol/L (ref 3.5–5.1)
Sodium: 133 mmol/L — ABNORMAL LOW (ref 135–145)

## 2021-01-17 LAB — MAGNESIUM: Magnesium: 1.9 mg/dL (ref 1.7–2.4)

## 2021-01-17 MED ORDER — VALACYCLOVIR HCL 1 G PO TABS: 1000.0000 mg | ORAL_TABLET | Freq: Three times a day (TID) | ORAL | 0 refills | Status: DC

## 2021-01-17 MED ORDER — FUROSEMIDE 40 MG PO TABS: 40.0000 mg | ORAL_TABLET | Freq: Every day | ORAL | 11 refills | Status: DC

## 2021-01-17 NOTE — Progress Notes (Signed)
Report called to RN at [facility].

## 2021-01-17 NOTE — Progress Notes (Signed)
Physical Therapy Treatment Patient Details Name: Raven Peterson MRN: [id number] DOB: 11/29/1922 Today's Date: 01/17/2021    History of Present Illness The pt is a 87 yo female presenting from [facility] with shortness of breath due to acute exacerbation of HF. PMH includes: HFpEF, hypothyroidism, HTN, HLD, CAP, ischemic stroke (2015), seizure, UTI, and recent admission for volume overload and COVID 19.    PT Comments    Pt progressing steadily towards her physical therapy goals. Ambulating x 100 feet with a Rollator at a min guard assist level. SpO2 94-97% on RA. D/c plan remains appropriate.    Follow Up Recommendations  Home health PT     Equipment Recommendations   (pt had needed equipment)    Recommendations for Other Services       Precautions / Restrictions Precautions Precautions: Fall Restrictions Weight Bearing Restrictions: No    Mobility  Bed Mobility Overal bed mobility: Needs Assistance Bed Mobility: Supine to Sit     Supine to sit: Min assist     General bed mobility comments: light minA to progress trunk to upright  Transfers Overall transfer level: Needs assistance Equipment used: 4-wheeled walker Transfers: Sit to/from Stand Sit to Stand: Min guard         General transfer comment: Min guard for safety  Ambulation/Gait Ambulation/Gait assistance: Min guard Gait Distance (Feet): 100 Feet Assistive device: Rolling walker (2 wheeled) Gait Pattern/deviations: Step-through pattern;Decreased stride length;Trunk flexed Gait velocity: decreased   General Gait Details: Min guard for safety, able to navigate around objects in room   Stairs             Wheelchair Mobility    Modified Rankin (Stroke Patients Only)       Balance Overall balance assessment: Needs assistance Sitting-balance support: No upper extremity supported;Feet supported Sitting balance-Leahy Scale: Fair     Standing balance support: Bilateral upper  extremity supported Standing balance-Leahy Scale: Poor                              Cognition Arousal/Alertness: Awake/alert Behavior During Therapy: WFL for tasks assessed/performed Overall Cognitive Status: Difficult to assess                                 General Comments: Pleasant, agreeable to therapy      Exercises General Exercises - Lower Extremity Long Arc Quad: Both;10 reps;Seated    General Comments        Pertinent Vitals/Pain Pain Assessment: Faces Faces Pain Scale: Hurts a little bit Pain Location: left foot Pain Descriptors / Indicators: Guarding Pain Intervention(s): Monitored during session    Home Living                      Prior Function            PT Goals (current goals can now be found in the care plan section) Acute Rehab PT Goals Patient Stated Goal: go home PT Goal Formulation: With patient Time For Goal Achievement: 01/29/21 Potential to Achieve Goals: Good Progress towards PT goals: Progressing toward goals    Frequency    Min 3X/week      PT Plan Current plan remains appropriate    Co-evaluation              AM-PAC PT "6 Clicks" Mobility   Outcome Measure  Help needed  turning from your back to your side while in a flat bed without using bedrails?: None Help needed moving from lying on your back to sitting on the side of a flat bed without using bedrails?: A Little Help needed moving to and from a bed to a chair (including a wheelchair)?: A Little Help needed standing up from a chair using your arms (e.g., wheelchair or bedside chair)?: A Little Help needed to walk in hospital room?: A Little Help needed climbing 3-5 steps with a railing? : A Lot 6 Click Score: 18    End of Session Equipment Utilized During Treatment: Gait belt Activity Tolerance: Patient tolerated treatment well Patient left: with call bell/phone within reach;in chair;with chair alarm set Nurse Communication:  Mobility status PT Visit Diagnosis: Unsteadiness on feet (R26.81);Muscle weakness (generalized) (M62.81)     Time: 1791-5056 PT Time Calculation (min) (ACUTE ONLY): 20 min  Charges:  $Therapeutic Activity: 8-22 mins                     Lillia Pauls, PT, DPT Acute Rehabilitation Services Pager [phone] Office [phone]    Norval Morton 01/17/2021, 9:23 AM

## 2021-01-17 NOTE — Progress Notes (Signed)
RN attempted to wean pt back to room air before bedtime meds given. Pt with recent desats to 87% on room air while sleeping. 1L nasal cannula replaced. Sats 94-95%.

## 2021-01-17 NOTE — Progress Notes (Signed)
Pt discharge paperwork printed and placed for PTAR to review IV removed. Telemetry removed. Attempted to call report to [facility] x2; No response. Will continue to make attempt;

## 2021-01-17 NOTE — Progress Notes (Incomplete)
HD#4 Subjective:  Overnight Events:    Objective:  Vital signs in last 24 hours: Vitals:   01/17/21 0300 01/17/21 0400 01/17/21 0436 01/17/21 0500  BP:   119/61   Pulse:   80   Resp:   (!) 24   Temp:   (!) 97.5 F (36.4 C)   TempSrc:   Oral   SpO2: 95% 95% 95% 97%  Weight:   64.2 kg   Height:       Supplemental O2: Nasal Cannula SpO2: 97 % O2 Flow Rate (L/min): 1 L/min FiO2 (%): 24 %   Physical Exam:   General: Pleasant elderly woman sitting in bed. NAD CV: RRR. III/VI early systolic murmur heard best at RUSB. Trace BLE edema.  Pulmonary: On 2LNC. Lung CTAB. No wheezing or rales. Abdominal: Soft. NT/ND. Normal BS.  Extremities: 2+ distal pulses. Normal ROM Skin: Warm and dry. Skin with wrinkles s/p diuresis.  Neuro: A&Ox3. No focal deficits.   Filed Weights   01/15/21 0034 01/16/21 0513 01/17/21 0436  Weight: 66.4 kg 66.3 kg 64.2 kg    Intake/Output Summary (Last 24 hours) at 01/17/2021 0644 Last data filed at 01/17/2021 0440 Gross per 24 hour  Intake 360 ml  Output 1204 ml  Net -844 ml   Net IO Since Admission: -4,941 mL [01/17/21 0644]  No results for input(s): GLUCAP in the last 72 hours.   Pertinent Labs: CBC Latest Ref Rng & Units 01/16/2021 01/13/2021 01/13/2021  WBC 4.0 - 10.5 K/uL 7.0 - 5.7  Hemoglobin 12.0 - 15.0 g/dL 16.9 67.8 93.8  Hematocrit 36.0 - 46.0 % 39.5 42.0 44.8  Platelets 150 - 400 K/uL 259 - 288    CMP Latest Ref Rng & Units 01/17/2021 01/16/2021 01/15/2021  Glucose 70 - 99 mg/dL 101(B) 510(C) 585(I)  BUN 8 - 23 mg/dL 22 15 13   Creatinine 0.44 - 1.00 mg/dL ) 7.78(E) 4.23(N)  Sodium 135 - 145 mmol/L 133(L) 140 139  Potassium 3.5 - 5.1 mmol/L 4.0 4.5 3.2(L)  Chloride 98 - 111 mmol/L 95(L) 99 98  CO2 22 - 32 mmol/L 26 27 29   Calcium 8.9 - 10.3 mg/dL 8.1(L) 8.5(L) 8.7(L)  Total Protein 6.5 - 8.1 g/dL - - -  Total Bilirubin 0.3 - 1.2 mg/dL - - -  Alkaline Phos 38 - 126 U/L - - -  AST 15 - 41 U/L - - -  ALT 0 - 44 U/L - - -     Imaging: No results found.  Assessment/Plan:   Active Problems:   Acute heart failure with preserved ejection fraction (HFpEF) (HCC)   Pressure injury of skin   Patient Summary: Raven Peterson is a 87 y.o. 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 Currently getting diuresis. Plan for discharge to her assisted living facility with home hospice.  AHRF 2/2 Acute on chronic diastolic HF Respiratory status improved. O2 sats above 95% on 2 L [place]. Denies chest pain or palpitations. States shortness of breath is better. Making good UOP w/ net I/O -4 L since admission. Kidney function stable. --Decrease IV Lasix from 40 BID to 40 mg daily --Strict I&Os  --Daily weights --Daily BMPs  Goals of care Palliative care consulted to assist with goals of care conversation. Patient and daughter agreeable to home with hospice.  --Palliative care following, appreciate  assistance --Plan for discharge to [facility] with hospice once medically stable --Home oxygen prn for comfort --DNR  Hypertensive Emergency, resolved BP improved on current antihypertensive regimen. Current BP 118/57.  --Continue lisinopril 5 mg daily --Daily vitals  Hypothyroidism: TSH normal. Continue Synthroid 25 mcg daily Possible Shingles: Continue lidocaine patches daily PRN Hx of Seizures: Continue Vimpat 100 mg daily HLD: Stable and chronic. Continue atorvastatin 40 mg daily GERD: Stable and chronic. Continue Protonix 40 mg daily  Diet: Heart healthy IVF: PO intake VTE: apixaban (ELIQUIS) tablet 2.5 mg Start: 01/13/21 2345apixaban (ELIQUIS) tablet 2.5 mg  Code: DNR PT/OT: None ID:  Anti-infectives (From admission, onward)   Start     Dose/Rate Route Frequency Ordered Stop   01/13/21 1945  valACYclovir (VALTREX) tablet 1,000  mg        1,000 mg Oral  Once 01/13/21 1937 01/13/21 2025   01/13/21 0000  valACYclovir (VALTREX) 1000 MG tablet        1,000 mg Oral 3 times daily 01/13/21 2057 01/20/21 2359      Anticipated discharge to Nursing Home in 1-2 days pending diuresis and improvement in respiratory status.  Steffanie Rainwater, MD 01/17/2021, 6:44 AM Pager: [phone] [facility]  Please contact the on call pager after 5 pm and on weekends at [phone].

## 2021-01-17 NOTE — TOC Progression Note (Addendum)
Transition of Care [facility]) - Progression Note    Patient Details  Name: Raven Peterson MRN: [id number] Date of Birth: 1922/10/02  Transition of Care [facility]) CM/SW Contact  Erin Sons, [place] Phone Number: 01/17/2021, 10:27 AM  Clinical Narrative:     CSW informed that pt is medically stable to discharge back to ALF today. CSW left message with [name] to confirm pt can return; requested call back. CSW is informed by [facility] that pt would need to wait on DME to be delivered to ALF prior to d/c.  1130: [facility] Called CSW back and confirmed pt can return today. CSW is informed that no new covid test is needed.   Expected Discharge Plan: Assisted Living Barriers to Discharge: Equipment Delay  Expected Discharge Plan and Services Expected Discharge Plan: Assisted Living       Living arrangements for the past 2 months: Assisted Living Facility                                       Social Determinants of Health (SDOH) Interventions    Readmission Risk Interventions No flowsheet data found.

## 2021-01-17 NOTE — Discharge Summary (Signed)
Name: Raven Peterson MRN: [id number] DOB: Jan 11, 1922 87 y.o. PCP: Patient, No Pcp Per  Date of Admission: 01/13/2021  4:43 PM Date of Discharge: 01/17/21 Attending Physician: Tyson Alias, *  Discharge Diagnosis: 1. Acute hypoxic respiratory failure 2. Acute on chronic diastolic heart failure 3. Hypertensive emergency (resolved) 4. Goals of care  Discharge Medications: Allergies as of 01/17/2021      Reactions   Cimetidine Palpitations, Other (See Comments)   "Allergic," per MAR   Naproxen Sodium Other (See Comments)   Affects glaucoma medication   Fluzone [influenza Virus Vaccine] Other (See Comments)   "Allergic," per MAR   Fosamax [alendronate Sodium] Other (See Comments)   "Allergic," per MAR   Loratadine Other (See Comments)   "Allergic," per [facility]   Iodinated Diagnostic Agents Nausea Only      Medication List    TAKE these medications   acetaminophen 500 MG tablet Commonly known as: TYLENOL Take 500-1,000 mg by mouth See admin instructions. Take 500 mg by mouth at 8 AM and 8 PM, 1,000 mg at 2 PM, and an additional 500 mg once a day as needed for pain   apixaban 2.5 MG Tabs tablet Commonly known as: ELIQUIS Take 2.5 mg by mouth in the morning and at bedtime. Reported on 04/03/2016   atorvastatin 40 MG tablet Commonly known as: LIPITOR Take 40 mg by mouth daily.   BAZA PROTECT EX Apply 1 application topically as needed (to buttocks- for redness).   benzonatate 100 MG capsule Commonly known as: TESSALON Take 1 capsule (100 mg total) by mouth every 8 (eight) hours. What changed:   when to take this  reasons to take this   fluticasone 50 MCG/ACT nasal spray Commonly known as: FLONASE Place 1 spray into both nostrils at bedtime.   furosemide 40 MG tablet Commonly known as: Lasix Take 1 tablet (40 mg total) by mouth daily.   HYDROcodone-acetaminophen 5-325 MG tablet Commonly known as: NORCO/VICODIN Take 1 tablet by mouth 3 (three) times daily as  needed for moderate pain. What changed: when to take this   Lacosamide 100 MG Tabs Take 100 mg by mouth in the morning and at bedtime.   latanoprost 0.005 % ophthalmic solution Commonly known as: XALATAN Place 1 drop into both eyes See admin instructions. Instill 1 drop into both eyes every other night at bedtime   levothyroxine 25 MCG tablet Commonly known as: SYNTHROID Take 25 mcg by mouth daily before breakfast.   lisinopril 5 MG tablet Commonly known as: ZESTRIL Take 5 mg by mouth daily.   methocarbamol 500 MG tablet Commonly known as: ROBAXIN Take 500 mg by mouth 2 (two) times daily as needed for muscle spasms.   montelukast 10 MG tablet Commonly known as: SINGULAIR Take 10 mg by mouth at bedtime.   omeprazole 20 MG capsule Commonly known as: PRILOSEC Take 20 mg by mouth at bedtime.   polyethylene glycol 17 g packet Commonly known as: MIRALAX / GLYCOLAX Take 17 g by mouth daily.   Senna Plus 8.6-50 MG Caps Generic drug: Sennosides-Docusate Sodium Take 1 capsule by mouth at bedtime.   valACYclovir 1000 MG tablet Commonly known as: VALTREX Take 1 tablet (1,000 mg total) by mouth 3 (three) times daily for 7 days.       Disposition and follow-up:   [name] was discharged from [facility] in Stable condition.  At the hospital follow up visit please address:  Acute on chronic diastolic heart failure.  Medication  changes: START lasix 40mg  daily Follow up recommendations: continue to monitor volume status and electrolytes  Goals of Care: discharged to hospice with [facility]    Labs / imaging needed at time of follow-up: BMP  Pending labs/ test needing follow-up: none  Follow-up Appointments: PCP   Hospital Course: 25 yof with diastolic heart failure who presented to 94 ED for acute respiratory failure 2/2 flash pulmonary edema in the setting of hypertensive emergency. She was placed on a nitro drip and BiPAP with significant  improvement in symptoms. She had a good response to lasix and was transitioned to oral lasix on the day prior to discharge.  Palliative care was consulted for discussions on goals of care. She wished to be discharged with hospice.  Day of discharge subjective: she is feeling weak this morning but has no other complaints. Discussed plans for discharge later today pending transportation availability  Discharge Vitals:   BP 119/61   Pulse 80   Temp (!) 97.5 F (36.4 C) (Oral)   Resp (!) 24   Ht 5\' 4"  (1.626 m)   Wt 64.2 kg   SpO2 97%   BMI 24.29 kg/m    Subjective Patient ready to leave the hospital. Denies any pain, SOB, or CP.   Physical Exam:  General: sitting up in chair Cardiac: RRR, extremities warm Pulm: breathing comfortably on room air, lungs clear Abd: Soft, NT/ND. Normal BS. Extremities: 2+ distal pulses.  Neuro: A&Ox3. No focal deficits  Pertinent Labs, Studies, and Procedures:  CBC Latest Ref Rng & Units 01/16/2021 01/13/2021 01/13/2021  WBC 4.0 - 10.5 K/uL 7.0 - 5.7  Hemoglobin 12.0 - 15.0 g/dL 03/13/2021 03/13/2021 50.5  Hematocrit 36.0 - 46.0 % 39.5 42.0 44.8  Platelets 150 - 400 K/uL 259 - 288   BMP Latest Ref Rng & Units 01/17/2021 01/16/2021 01/15/2021  Glucose 70 - 99 mg/dL 03/16/2021) 03/15/2021) 016(P)  BUN 8 - 23 mg/dL 22 15 13   Creatinine 0.44 - 1.00 mg/dL 537(S) 827(M) )  Sodium 135 - 145 mmol/L 133(L) 140 139  Potassium 3.5 - 5.1 mmol/L 4.0 4.5 3.2(L)  Chloride 98 - 111 mmol/L 95(L) 99 98  CO2 22 - 32 mmol/L 26 27 29   Calcium 8.9 - 10.3 mg/dL 8.1(L) 8.5(L) 8.7(L)     Signed:  7.86(L, MD Internal Medicine Resident PGY-2 5.44(B Internal Medicine Residency Pager: [phone] 01/17/2021 5:49 AM

## 2021-01-17 NOTE — TOC Transition Note (Signed)
Transition of Care [facility]) - CM/SW Discharge Note   Patient Details  Name: Raven Peterson MRN: [id number] Date of Birth: 04-06-1922  Transition of Care [facility]) CM/SW Contact:  Erin Sons, LCSW Phone Number: 01/17/2021, 3:46 PM   Clinical Narrative:     Patient will DC to: [facility] Anticipated DC date: 01/17/21 Family notified: Ozella Rocks (Daughter)  [phone] (Mobile) Transport by: Sharin Mons   Per MD patient ready for DC to [facility]. RN, patient, patient's family, and facility notified of DC. Discharge Summary and FL2 sent to facility. RN to call report prior to discharge [phone] ). DC packet on chart. Ambulance transport requested for patient.   CSW will sign off for now as social work intervention is no longer needed. Please consult [place] again if new needs arise.   Final next level of care: Assisted Living Barriers to Discharge: No Barriers Identified   Patient Goals and CMS Choice        Discharge Placement              Patient chooses bed at:  ([facility]) Patient to be transferred to facility by: [facility] Name of family member notified: Ozella Rocks (Daughter)   [phone] (Mobile)t Patient and family notified of of transfer: 01/17/21  Discharge Plan and Services                                     Social Determinants of Health (SDOH) Interventions     Readmission Risk Interventions No flowsheet data found.

## 2021-01-17 NOTE — Progress Notes (Signed)
Civil engineer, contracting [facility])  Received request from PMT for hospice services at [facility] after discharge.   Hospital liaison spoke to daughter [name] to initiate education related to hospice philosophy and services and to offer to answer any questions at this time. Per discussion daughter would like patient to go back to [facility] with [facility] hospice services. She has been approved for hospice.   DME has been ordered: Hospital bed, BSC, OTB table, PRN 02. Will be delivered to [facility]. Please let [facility] know if patient will not return back to facility.   Per discussion, will need [facility] for transport.   Please send signed and completed DNR form home with patient/family. Patient will need prescriptions for discharge comfort medications.    [facility] information and contact numbers given to [name].  Please call with any questions or concerns.  Thank you for the opportunity to participate in this pt's care.  Yolande Jolly, BSN, [employer] [phone]

## 2021-01-17 NOTE — NC FL2 (Signed)
[place] MEDICAID FL2 LEVEL OF CARE SCREENING TOOL     IDENTIFICATION  Patient Name: Raven Peterson Birthdate: 19-Jul-1922 Sex: female Admission Date (Current Location): 01/13/2021  [facility] and [place] Number:      Facility and Address:  [facility], [address], [place], [place] [address]      Provider Number: [id number]  Attending Physician Name and Address:  Reymundo Poll, MD  Relative Name and Phone Number:  Leontine Locket [phone]    Current Level of Care: Hospital Recommended Level of Care: Assisted Living Facility Prior Approval Number:    Date Approved/Denied:   PASRR Number:    Discharge Plan: Other (Comment) (ALF)    Current Diagnoses: Patient Active Problem List   Diagnosis Date Noted  . Pressure injury of skin 01/16/2021  . Acute heart failure with preserved ejection fraction (HFpEF) (HCC) 01/13/2021  . Pneumonia due to COVID-19 virus 12/10/2020  . Acute on chronic heart failure with preserved ejection fraction (HFpEF) (HCC)   . Dyspnea 06/01/2019  . Chronic anticoagulation 05/30/2019  . Hypothyroidism 05/30/2019  . Hyperlipidemia 05/30/2019  . Atherosclerosis of abdominal aorta (HCC) 05/30/2019  . Ileus (HCC) 05/30/2019  . SBO (small bowel obstruction) (HCC) 05/30/2019  . Elevated troponin level 05/30/2019  . Dependent on walker for ambulation 05/30/2019  . Essential hypertension 10/02/2014  . GERD (gastroesophageal reflux disease) 10/02/2014  . Glaucoma 10/02/2014  . History of stroke 05/29/2014    Orientation RESPIRATION BLADDER Height & Weight     Self,Time,Situation,Place  O2 (1L) Incontinent Weight: 141 lb 8.6 oz (64.2 kg) Height:  5\' 4"  (162.6 cm)  BEHAVIORAL SYMPTOMS/MOOD NEUROLOGICAL BOWEL NUTRITION STATUS      Continent Diet (Regular diet)  AMBULATORY STATUS COMMUNICATION OF NEEDS Skin   Limited Assist Verbally PU Stage and Appropriate Care (Coccyx)                       Personal Care  Assistance Level of Assistance  Bathing,Feeding,Dressing Bathing Assistance: Limited assistance Feeding assistance: Independent Dressing Assistance: Limited assistance     Functional Limitations Info  Sight,Hearing,Speech Sight Info: Impaired Hearing Info: Impaired Speech Info: Adequate    SPECIAL CARE FACTORS FREQUENCY                       Contractures Contractures Info: Not present    Additional Factors Info  Code Status,Allergies Code Status Info: DNR Allergies Info: Cimetidine, naproxone sodium, Fluzone (influenza Virus Vaccine), Fosamax (alendronate Sodium), Loratadine, Iodinated Diagnostic Agents             Discharge Medications: Medication List    TAKE these medications   acetaminophen 500 MG tablet Commonly known as: TYLENOL Take 500-1,000 mg by mouth See admin instructions. Take 500 mg by mouth at 8 AM and 8 PM, 1,000 mg at 2 PM, and an additional 500 mg once a day as needed for pain   apixaban 2.5 MG Tabs tablet Commonly known as: ELIQUIS Take 2.5 mg by mouth in the morning and at bedtime. Reported on 04/03/2016   atorvastatin 40 MG tablet Commonly known as: LIPITOR Take 40 mg by mouth daily.   BAZA PROTECT EX Apply 1 application topically as needed (to buttocks- for redness).   benzonatate 100 MG capsule Commonly known as: TESSALON Take 1 capsule (100 mg total) by mouth every 8 (eight) hours. What changed:   when to take this  reasons to take this   fluticasone 50  MCG/ACT nasal spray Commonly known as: FLONASE Place 1 spray into both nostrils at bedtime.   furosemide 40 MG tablet Commonly known as: Lasix Take 1 tablet (40 mg total) by mouth daily.   HYDROcodone-acetaminophen 5-325 MG tablet Commonly known as: NORCO/VICODIN Take 1 tablet by mouth 3 (three) times daily as needed for moderate pain. What changed: when to take this   Lacosamide 100 MG Tabs Take 100 mg by mouth in the morning and at bedtime.   latanoprost  0.005 % ophthalmic solution Commonly known as: XALATAN Place 1 drop into both eyes See admin instructions. Instill 1 drop into both eyes every other night at bedtime   levothyroxine 25 MCG tablet Commonly known as: SYNTHROID Take 25 mcg by mouth daily before breakfast.   lisinopril 5 MG tablet Commonly known as: ZESTRIL Take 5 mg by mouth daily.   methocarbamol 500 MG tablet Commonly known as: ROBAXIN Take 500 mg by mouth 2 (two) times daily as needed for muscle spasms.   montelukast 10 MG tablet Commonly known as: SINGULAIR Take 10 mg by mouth at bedtime.   omeprazole 20 MG capsule Commonly known as: PRILOSEC Take 20 mg by mouth at bedtime.   polyethylene glycol 17 g packet Commonly known as: MIRALAX / GLYCOLAX Take 17 g by mouth daily.   Senna Plus 8.6-50 MG Caps Generic drug: Sennosides-Docusate Sodium Take 1 capsule by mouth at bedtime.   valACYclovir 1000 MG tablet Commonly known as: VALTREX Take 1 tablet (1,000 mg total) by mouth 3 (three) times daily for 7 days.     Relevant Imaging Results:  Relevant Lab Results:   Additional Information SS# [id number]  [facility], LCSW

## 2021-01-18 NOTE — Progress Notes (Signed)
Pt discharged via [facility]. IV removed, cardiac telemetry removed, and pt's paperwork/personal belongings sent with pt.

## 2021-09-27 ENCOUNTER — Emergency Department (HOSPITAL_COMMUNITY)
Admission: EM | Admit: 2021-09-27 | Discharge: 2021-09-27 | Disposition: A | Discharge status: Home / Self Care | Payer: Medicare Other | Attending: Emergency Medicine | Admitting: Emergency Medicine

## 2021-09-27 ENCOUNTER — Emergency Department (HOSPITAL_COMMUNITY): Payer: Medicare Other

## 2021-09-27 DIAGNOSIS — Z7982 Long term (current) use of aspirin: Secondary | ICD-10-CM | POA: Insufficient documentation

## 2021-09-27 DIAGNOSIS — I509 Heart failure, unspecified: Secondary | ICD-10-CM | POA: Insufficient documentation

## 2021-09-27 DIAGNOSIS — I110 Hypertensive heart disease with heart failure: Secondary | ICD-10-CM | POA: Insufficient documentation

## 2021-09-27 DIAGNOSIS — W19XXXA Unspecified fall, initial encounter: Secondary | ICD-10-CM

## 2021-09-27 DIAGNOSIS — S0990XA Unspecified injury of head, initial encounter: Secondary | ICD-10-CM | POA: Diagnosis not present

## 2021-09-27 DIAGNOSIS — Y92129 Unspecified place in nursing home as the place of occurrence of the external cause: Secondary | ICD-10-CM | POA: Diagnosis not present

## 2021-09-27 DIAGNOSIS — S51811A Laceration without foreign body of right forearm, initial encounter: Secondary | ICD-10-CM | POA: Diagnosis not present

## 2021-09-27 DIAGNOSIS — R109 Unspecified abdominal pain: Secondary | ICD-10-CM | POA: Insufficient documentation

## 2021-09-27 DIAGNOSIS — Z79899 Other long term (current) drug therapy: Secondary | ICD-10-CM | POA: Insufficient documentation

## 2021-09-27 DIAGNOSIS — W01198A Fall on same level from slipping, tripping and stumbling with subsequent striking against other object, initial encounter: Secondary | ICD-10-CM | POA: Insufficient documentation

## 2021-09-27 DIAGNOSIS — M549 Dorsalgia, unspecified: Secondary | ICD-10-CM

## 2021-09-27 DIAGNOSIS — E039 Hypothyroidism, unspecified: Secondary | ICD-10-CM | POA: Diagnosis not present

## 2021-09-27 DIAGNOSIS — S59911A Unspecified injury of right forearm, initial encounter: Secondary | ICD-10-CM | POA: Diagnosis present

## 2021-09-27 DIAGNOSIS — Z8616 Personal history of COVID-19: Secondary | ICD-10-CM | POA: Diagnosis not present

## 2021-09-27 MED ORDER — ACETAMINOPHEN 325 MG PO TABS
650.0000 mg | ORAL_TABLET | Freq: Once | ORAL | Status: DC
  Filled 2021-09-27: qty 2

## 2021-09-27 NOTE — ED Triage Notes (Signed)
Pt reports 'feeling stupid in the head' for some time.

## 2021-09-27 NOTE — ED Notes (Signed)
EMS called for transport. Report attempted x1, facility will call back

## 2021-09-27 NOTE — ED Notes (Signed)
Pt required 1 assist to get off stretcher due to height but ambulated with walker 75ft around room with no assistance. Gait steady. Also able to stand for brief change in room.

## 2021-09-27 NOTE — ED Provider Notes (Signed)
[facility]EMERGENCY DEPT Provider Note   CSN: [id number] Arrival date & time: 09/27/21  8341     History Chief Complaint  Patient presents with   Raven Peterson is a 87 y.o. female.  She presents due to unwitnessed mechanical fall this morning.  She states that she was trying to hang up her nightgown in the back of her door when she became off balance and fell on her right side.  She hit her right forearm and right hip due to the fall.  She denies hitting her head during the fall.  She currently lives at [facility] assisted living facility with [facility] care hospice following.    Fall Pertinent negatives include no abdominal pain and no headaches.      Past Medical History:  Diagnosis Date   Acute ischemic stroke (HCC) 10/02/2014   right cerebellar and left posterior temporal CVA: This was confirmed on MRI. We suspected an embolic source given the distribution on MRI. She has no history of atrial fib and no atrial fib was seen on telemetry. She was seen by neurology in consultation and underwent carotid US which showed mild right carotid stenosis (<50%) and moderate left carotid stenosis (50-69%). She was treated with aspiri   CAP (community acquired pneumonia) 03/17/2015   Chronic back pain    "used to get shots in my back by a Pain Management doctor"   COVID-19    Dyspnea    GERD (gastroesophageal reflux disease)    Glaucoma    Hyperlipidemia    Hypertension    Hypothyroidism    Inferior pubic ramus fracture, right, with routine healing, subsequent encounter    Seizure (HCC) 10/02/2014   Small bowel obstruction (HCC)    Thyroid disease    UTI from E.coli 2018   Vertebral compression fracture [facility])     Patient Active Problem List   Diagnosis Date Noted   Pressure injury of skin 01/16/2021   Acute heart failure with preserved ejection fraction (HFpEF) (HCC) 01/13/2021   Pneumonia due to COVID-19 virus 12/10/2020   Acute on chronic heart  failure with preserved ejection fraction (HFpEF) (HCC)    Dyspnea 06/01/2019   Chronic anticoagulation 05/30/2019   Hypothyroidism 05/30/2019   Hyperlipidemia 05/30/2019   Atherosclerosis of abdominal aorta (HCC) 05/30/2019   Ileus (HCC) 05/30/2019   SBO (small bowel obstruction) (HCC) 05/30/2019   Elevated troponin level 05/30/2019   Dependent on walker for ambulation 05/30/2019   Essential hypertension 10/02/2014   GERD (gastroesophageal reflux disease) 10/02/2014   Glaucoma 10/02/2014   History of stroke 05/29/2014    Past Surgical History:  Procedure Laterality Date   CATARACT EXTRACTION, BILATERAL     CHOLECYSTECTOMY       OB History   No obstetric history on file.     Family History  Problem Relation Age of Onset   Healthy Mother    Asthma Father     Social History   Tobacco Use   Smoking status: Never   Smokeless tobacco: Never  Vaping Use   Vaping Use: Never used  Substance Use Topics   Alcohol use: No   Drug use: No    Home Medications Prior to Admission medications   Medication Sig Start Date End Date Taking? Authorizing Provider  aspirin 81 MG chewable tablet Chew 81 mg by mouth daily.   Yes [provider]  atorvastatin (LIPITOR) 40 MG tablet Take 40 mg by mouth daily.   Yes [provider]  benzonatate (TESSALON) 100 MG capsule Take 1 capsule (100 mg total) by mouth every 8 (eight) hours. Patient taking differently: Take 100 mg by mouth every 8 (eight) hours as needed for cough. 07/07/18  Yes Burky, Barron Alvine, NP  Ensure (ENSURE) Take 237 mLs by mouth 3 (three) times daily between meals.   Yes [provider]  fluticasone (FLONASE) 50 MCG/ACT nasal spray Place 1 spray into both nostrils at bedtime.   Yes [provider]  furosemide (LASIX) 40 MG tablet Take 1 tablet (40 mg total) by mouth daily. 01/17/21 01/17/22 Yes Steffanie Rainwater, MD  gabapentin (NEURONTIN) 100 MG capsule Take 100 mg by mouth at bedtime.   Yes  [provider]  HYDROcodone-acetaminophen (NORCO/VICODIN) 5-325 MG tablet Take 1 tablet by mouth 3 (three) times daily as needed for moderate pain. Patient taking differently: Take 1 tablet by mouth every 4 (four) hours. 06/03/19  Yes Arrien, York Ram, MD  levETIRAcetam (KEPPRA) 500 MG tablet Take 500 mg by mouth 2 (two) times daily.   Yes [provider]  levothyroxine (SYNTHROID, LEVOTHROID) 25 MCG tablet Take 25 mcg by mouth daily before breakfast.  07/13/15  Yes [provider]  LORazepam (ATIVAN) 0.5 MG tablet Take 0.25 mg by mouth See admin instructions. 0.25mg  oral in the evening And 0.25mg  oral every 12 hours as needed for breakthrough anxiety   Yes [provider]  methocarbamol (ROBAXIN) 500 MG tablet Take 500 mg by mouth 2 (two) times daily as needed for muscle spasms.   Yes [provider]  miconazole (MICOTIN) 2 % cream Apply 1 application topically as needed (gluteal redness).   Yes [provider]  montelukast (SINGULAIR) 10 MG tablet Take 10 mg by mouth at bedtime.   Yes [provider]  Morphine Sulfate (MORPHINE CONCENTRATE) 10 mg / 0.5 ml concentrated solution Take 5 mg by mouth every 4 (four) hours as needed for severe pain (dyspnea).   Yes [provider]  omeprazole (PRILOSEC) 20 MG capsule Take 20 mg by mouth at bedtime.   Yes [provider]  ondansetron (ZOFRAN) 4 MG tablet Take 4 mg by mouth every 6 (six) hours as needed for nausea or vomiting.   Yes [provider]  polyethylene glycol (MIRALAX / GLYCOLAX) 17 g packet Take 17 g by mouth daily. 06/03/19  Yes Arrien, York Ram, MD  SENNA PLUS 50-8.6 MG CAPS Take 1 capsule by mouth at bedtime. 12/20/20  Yes [provider]  sertraline (ZOLOFT) 50 MG tablet Take 50 mg by mouth daily.   Yes [provider]  apixaban (ELIQUIS) 2.5 MG TABS tablet Take 2.5 mg by mouth in the morning and at bedtime. Reported on  04/03/2016 06/16/15 01/13/21  [provider]    Allergies    Cimetidine, Naproxen sodium, Fluzone [influenza virus vaccine], Fosamax [alendronate sodium], Loratadine, and Iodinated diagnostic agents  Review of Systems   Review of Systems  Gastrointestinal:  Negative for abdominal pain.  Genitourinary:  Negative for pelvic pain.  Skin:  Positive for wound.  Neurological:  Negative for weakness and headaches.   Physical Exam Updated Vital Signs BP (!) 176/81   Pulse 76   Temp 97.9 F (36.6 C) (Oral)   Resp 18   SpO2 97%   Physical Exam Constitutional:      Comments: Chronically ill-appearing  HENT:     Head: Normocephalic and atraumatic.     Mouth/Throat:     Mouth: Mucous membranes are moist.  Pharynx: Oropharynx is clear.  Cardiovascular:     Rate and Rhythm: Normal rate and regular rhythm.  Pulmonary:     Effort: Pulmonary effort is normal.     Breath sounds: Normal breath sounds.  Abdominal:     General: Abdomen is flat. Bowel sounds are normal.     Palpations: Abdomen is soft.     Tenderness: There is no abdominal tenderness.  Skin:    Findings: Lesion present.     Comments: 4cm vertical distal skin tear on right forearm, 6 cm vertical proximal skin tear on right forearm.   Neurological:     General: No focal deficit present.     Mental Status: She is alert and oriented to person, place, and time. Mental status is at baseline.  Psychiatric:        Mood and Affect: Mood normal.        Behavior: Behavior normal.    ED Results / Procedures / Treatments   Labs (all labs ordered are listed, but only abnormal results are displayed) Labs Reviewed - No data to display  EKG None  Radiology No results found.  Procedures Procedures   Medications Ordered in ED Medications - No data to display  ED Course  I have reviewed the triage vital signs and the nursing notes.  Pertinent labs & imaging results that were available during my care of the patient  were reviewed by me and considered in my medical decision making (see chart for details).    MDM Rules/Calculators/A&P                          Patient is 87 year old with past medical history of CVA on Eliquis, glaucoma, HTN, seizure, and thyroid disease presents due to unwitnessed mechanical fall this morning.  No change in mental status and patient is oriented x 4.  Physical exam significant for 2 small skin laceration on right forearm and tenderness to right hip and right flank.    Work up today with CT head, CT abd/ pelvis, L-spine and hip x-ray performed.  CT head negative.  CT L-spine showed stable remote compression fractures at L3 and L4.  CT abdomen pelvis showed subacute unhealed L1 vertebral body compression fracture.  Patient was able to ambulate with her walker and moderate assistance from nursing staff.  No need for further emergent work up at this time. Discussed results with patient and her daughter.   At this time, patient is stable for discharge.  Discharge instructions concerning home care of right forearm lesions given.  The patient is stable and is discharged to assisted living facility in good condition.  Discussed this case with my attending physician who cosigned this note including patient's presenting symptoms, physical exam plan diagnostic interventions.  Attending physician is in agreement with plan and made changes which were implemented.  Attending physician assessed patient at bedside.  Final Clinical Impression(s) / ED Diagnoses Final diagnoses:  None    Rx / DC Orders ED Discharge Orders     None        Emmeline Winebarger, Florentina Addison, DO 09/27/21 1308    Melene Plan, DO 09/27/21 1346

## 2021-09-27 NOTE — ED Triage Notes (Signed)
Pt BIBA from [facility] for unwitnessed mechanical fall while putting clothing away. Ambulates with walker. Denies head trauma but EMS noted small hematoma to right side of head, undetermined age. No deformities, PERRLA. Skin tear to right forearm. No blood thinners. Denies pain, dizziness, LOC. A&Ox4.  BP 140/96 HR 80 SpO2 95% RA CBG 116

## 2021-09-27 NOTE — Discharge Instructions (Signed)
Ms. Tsao, You brought to the ED because we were concerned after your fall this morning.  We did imaging of your head and chest and hips to make sure there were no bone fractures.  We did not see any bone fractures on the imaging.  You also had 2 cuts on your right arm.  We did not place any stitches, but did apply Steri-Strips to bring the skin back together.  Please use Xeroform dressing and gauze to keep your wound clean and covered.  It was so nice to speak with you today and a pleasure to meet you. Thank you, Rudene Christians, DO

## 2022-02-14 ENCOUNTER — Other Ambulatory Visit: Payer: Self-pay

## 2022-02-14 ENCOUNTER — Non-Acute Institutional Stay: Payer: Medicare Other | Admitting: Family Medicine

## 2022-02-14 ENCOUNTER — Encounter: Payer: Self-pay | Admitting: Family Medicine

## 2022-02-14 VITALS — BP 140/68 | HR 84 | Temp 97.7°F | Resp 16

## 2022-02-14 DIAGNOSIS — I5032 Chronic diastolic (congestive) heart failure: Secondary | ICD-10-CM

## 2022-02-14 DIAGNOSIS — M545 Low back pain, unspecified: Secondary | ICD-10-CM

## 2022-02-14 DIAGNOSIS — G8929 Other chronic pain: Secondary | ICD-10-CM | POA: Insufficient documentation

## 2022-02-14 DIAGNOSIS — Z515 Encounter for palliative care: Secondary | ICD-10-CM | POA: Insufficient documentation

## 2022-02-14 NOTE — Progress Notes (Signed)
? ? ?Manufacturing engineer ?Community Palliative Care Consult Note ?Telephone: [phone]  ?Fax: [phone]  ? ? ?Date of encounter: 02/14/22 ?10:49 AM ?PATIENT NAME: Raven Peterson ?[place] [address]   ?[phone] (home)  ?DOB: [birth year] ?MRN: [id number] ?PRIMARY CARE PROVIDER:    ?Patient, No Pcp Per (Inactive),  ?No address on file ?None ? ?REFERRING PROVIDER:   ?No referring provider defined for this encounter. ?N/A ? ?RESPONSIBLE PARTY:    ?Contact Information   ? ? Name Relation Home Work Mobile  ? [place] [employer] Daughter [phone]  [phone]  ? ?  ? ? ? ?I met face to face with patient in [place]. Palliative Care was asked to follow this patient by consultation request of  No ref. provider found to address advance care planning and complex medical decision making. This is a follow up visit. ? ?                                 ASSESSMENT, SYMPTOM MANAGEMENT AND PLAN / RECOMMENDATIONS:  ? ? Palliative Care Encounter ?Transitioned from Hospice due to improved prognosis to Palliative Service  ?Address goals of care with daughter ? ?Chronic midline low back pain ?Typical end of day back pain, recommend scheduled pain med-verify that pt is receiving scheduled Hydrocodone Acetaminophen 5/325 mg Q 4 hrs.   ?Continue Gabapentin 100 mg QHS. ? ?Chronic heart failure with preserved EF ?Appears euvolemic.  ?Continue Lasix 40 mg daily. ? ?Advance Care Planning/Goals of Care: Goals include to maximize quality of life and symptom management.  Will revisit with  ? ?CODE STATUS: ?DNR ? ? ? ? ?Follow up Palliative Care Visit: Palliative care will continue to follow for complex medical decision making, advance care planning, and clarification of goals. Return 4 weeks or prn. ? ? ?This visit was coded based on medical decision making (MDM). ? ?PPS: 50% ? ?HOSPICE ELIGIBILITY/DIAGNOSIS: TBD ? ?Chief Complaint:  ?[facility] received a  referral to follow up with patient for chronic disease management after improved prognosis with d/c from Hospice care. Palliative Care to assess advance directive and defining/refining goals of care with patient. ? ? ?HISTORY OF PRESENT ILLNESS:  Raven Peterson is a 88 y.o. year old female with chronic heart failure with preserved EF, chronic low back pain, hypothyroidism, HTN, atherosclerosis, hx of stroke/seizure/dyspnea/SBO who was on chronic anticoagulation with Eliquis and likely taken off due to falls.  Pt states she is unable to bathe and dress herself independently, ambulates with a rollator walker, sleeps well, reports poor appetite and good mood.  She has some urge incontinence of urine, has bowel control and reports no recent falls.  She has 4 children but her daughter lives nearby and visits her frequently.  She states her biggest problem is her back pain and that at times she doesn't get pain meds at bedtime but once she gets to sleep she does pretty good.  Has had some occasional atrial tachycardia and hx of ischemic stroke but no noted residual deficits, no noted AFIB or valvular disease. Last CBC/CMP 1 year ago with normal CBC and albumin mildly low at 3.2. ? ?History obtained from review of EMR, discussion with Ms. Bassin.  ?I reviewed available labs, medications, imaging, studies and related documents from the EMR.  Records reviewed and summarized above.  ? ?ROS ?General: NAD ?EYES: denies vision changes ?ENMT: denies dysphagia ?  Cardiovascular: denies chest pain, denies DOE ?Pulmonary: denies cough, denies increased SOB ?Abdomen: endorses fair appetite, denies constipation, endorses continence of bowel ?GU: denies dysuria, endorses urge incontinence of urine ?MSK:  denies increased weakness, no recent falls reported ?Skin: denies rashes or wounds ?Neurological: denies insomnia, has midline low back pain particularly problematic in evenings ?Psych: Endorses positive mood ?Heme/lymph/immuno: denies  bruises, abnormal bleeding ? ?Physical Exam: ?Current and past weights: Last weight documented 141 lbs 8.6 oz as of 01/17/21 ?Constitutional: NAD ?General: thin ?EYES: anicteric sclera, lids intact, no discharge  ?ENMT: mildly hard of hearing, oral mucous membranes moist, dentition intact except missing front lower tooth ?CV: S1S2, RRR with holosystolic murmur RSB, no LE edema ?Pulmonary: CTAB, no increased work of breathing, no cough, room air ?Abdomen: normo-active BS + 4 quadrants, soft and non tender, no ascites ?GU: deferred ?MSK: no sarcopenia, moves all extremities, ambulatory with rollator walker ?Skin: warm and dry, no rashes or wounds on visible skin ?Neuro:  no generalized weakness, no cognitive impairment ?Psych: non-anxious affect, A and O x 3 ?Hem/lymph/immuno: no widespread bruising ? ? ?Thank you for the opportunity to participate in the care of Ms. Zunker.  The palliative care team will continue to follow. Please call our office at [phone] if we can be of additional assistance.  ? ?Marijo Conception, FNP-C  ? ?COVID-19 PATIENT SCREENING TOOL ?Asked and negative response unless otherwise noted:  ? ?Have you had symptoms of covid, tested positive or been in contact with someone with symptoms/positive test in the past 5-10 days? No ? ?

## 2022-09-07 ENCOUNTER — Emergency Department (HOSPITAL_COMMUNITY): Payer: Medicare Other

## 2022-09-07 ENCOUNTER — Inpatient Hospital Stay (HOSPITAL_COMMUNITY)
Admission: EM | Admit: 2022-09-07 | Discharge: 2022-09-11 | DRG: 061 | Disposition: A | Discharge status: Home Health | Payer: Medicare Other | Attending: Neurology | Admitting: Neurology

## 2022-09-07 DIAGNOSIS — Z7982 Long term (current) use of aspirin: Secondary | ICD-10-CM

## 2022-09-07 DIAGNOSIS — R270 Ataxia, unspecified: Secondary | ICD-10-CM | POA: Diagnosis present

## 2022-09-07 DIAGNOSIS — H9190 Unspecified hearing loss, unspecified ear: Secondary | ICD-10-CM | POA: Diagnosis present

## 2022-09-07 DIAGNOSIS — Z91041 Radiographic dye allergy status: Secondary | ICD-10-CM

## 2022-09-07 DIAGNOSIS — R471 Dysarthria and anarthria: Secondary | ICD-10-CM | POA: Diagnosis present

## 2022-09-07 DIAGNOSIS — E785 Hyperlipidemia, unspecified: Secondary | ICD-10-CM | POA: Diagnosis present

## 2022-09-07 DIAGNOSIS — E039 Hypothyroidism, unspecified: Secondary | ICD-10-CM | POA: Diagnosis present

## 2022-09-07 DIAGNOSIS — B9620 Unspecified Escherichia coli [E. coli] as the cause of diseases classified elsewhere: Secondary | ICD-10-CM | POA: Diagnosis present

## 2022-09-07 DIAGNOSIS — Y92239 Unspecified place in hospital as the place of occurrence of the external cause: Secondary | ICD-10-CM | POA: Diagnosis not present

## 2022-09-07 DIAGNOSIS — I651 Occlusion and stenosis of basilar artery: Secondary | ICD-10-CM | POA: Diagnosis present

## 2022-09-07 DIAGNOSIS — I5032 Chronic diastolic (congestive) heart failure: Secondary | ICD-10-CM | POA: Diagnosis present

## 2022-09-07 DIAGNOSIS — Z66 Do not resuscitate: Secondary | ICD-10-CM | POA: Diagnosis present

## 2022-09-07 DIAGNOSIS — I10 Essential (primary) hypertension: Secondary | ICD-10-CM | POA: Diagnosis present

## 2022-09-07 DIAGNOSIS — Z79899 Other long term (current) drug therapy: Secondary | ICD-10-CM

## 2022-09-07 DIAGNOSIS — I63531 Cerebral infarction due to unspecified occlusion or stenosis of right posterior cerebral artery: Principal | ICD-10-CM

## 2022-09-07 DIAGNOSIS — Z79890 Hormone replacement therapy (postmenopausal): Secondary | ICD-10-CM

## 2022-09-07 DIAGNOSIS — I110 Hypertensive heart disease with heart failure: Secondary | ICD-10-CM | POA: Diagnosis present

## 2022-09-07 DIAGNOSIS — I4891 Unspecified atrial fibrillation: Secondary | ICD-10-CM | POA: Diagnosis present

## 2022-09-07 DIAGNOSIS — I480 Paroxysmal atrial fibrillation: Secondary | ICD-10-CM | POA: Diagnosis present

## 2022-09-07 DIAGNOSIS — I4821 Permanent atrial fibrillation: Secondary | ICD-10-CM | POA: Diagnosis not present

## 2022-09-07 DIAGNOSIS — N390 Urinary tract infection, site not specified: Secondary | ICD-10-CM | POA: Diagnosis present

## 2022-09-07 DIAGNOSIS — I639 Cerebral infarction, unspecified: Secondary | ICD-10-CM | POA: Diagnosis present

## 2022-09-07 DIAGNOSIS — G8929 Other chronic pain: Secondary | ICD-10-CM | POA: Diagnosis present

## 2022-09-07 DIAGNOSIS — G40909 Epilepsy, unspecified, not intractable, without status epilepticus: Secondary | ICD-10-CM

## 2022-09-07 DIAGNOSIS — H409 Unspecified glaucoma: Secondary | ICD-10-CM | POA: Diagnosis present

## 2022-09-07 DIAGNOSIS — Z825 Family history of asthma and other chronic lower respiratory diseases: Secondary | ICD-10-CM

## 2022-09-07 DIAGNOSIS — Z8616 Personal history of COVID-19: Secondary | ICD-10-CM

## 2022-09-07 DIAGNOSIS — M549 Dorsalgia, unspecified: Secondary | ICD-10-CM | POA: Diagnosis present

## 2022-09-07 DIAGNOSIS — Z8673 Personal history of transient ischemic attack (TIA), and cerebral infarction without residual deficits: Secondary | ICD-10-CM

## 2022-09-07 DIAGNOSIS — F32A Depression, unspecified: Secondary | ICD-10-CM | POA: Diagnosis present

## 2022-09-07 DIAGNOSIS — R4701 Aphasia: Secondary | ICD-10-CM | POA: Diagnosis present

## 2022-09-07 DIAGNOSIS — R29710 NIHSS score 10: Secondary | ICD-10-CM | POA: Diagnosis present

## 2022-09-07 DIAGNOSIS — T45615A Adverse effect of thrombolytic drugs, initial encounter: Secondary | ICD-10-CM | POA: Diagnosis not present

## 2022-09-07 DIAGNOSIS — I6389 Other cerebral infarction: Secondary | ICD-10-CM | POA: Diagnosis not present

## 2022-09-07 DIAGNOSIS — R29810 Facial weakness: Secondary | ICD-10-CM | POA: Diagnosis present

## 2022-09-07 DIAGNOSIS — Z7901 Long term (current) use of anticoagulants: Secondary | ICD-10-CM

## 2022-09-07 DIAGNOSIS — I611 Nontraumatic intracerebral hemorrhage in hemisphere, cortical: Secondary | ICD-10-CM | POA: Diagnosis not present

## 2022-09-07 DIAGNOSIS — I482 Chronic atrial fibrillation, unspecified: Secondary | ICD-10-CM | POA: Diagnosis not present

## 2022-09-07 DIAGNOSIS — Z888 Allergy status to other drugs, medicaments and biological substances status: Secondary | ICD-10-CM

## 2022-09-07 DIAGNOSIS — I6523 Occlusion and stenosis of bilateral carotid arteries: Secondary | ICD-10-CM | POA: Diagnosis present

## 2022-09-07 DIAGNOSIS — I6312 Cerebral infarction due to embolism of basilar artery: Secondary | ICD-10-CM | POA: Diagnosis not present

## 2022-09-07 DIAGNOSIS — N3000 Acute cystitis without hematuria: Secondary | ICD-10-CM | POA: Diagnosis not present

## 2022-09-07 DIAGNOSIS — E782 Mixed hyperlipidemia: Secondary | ICD-10-CM | POA: Diagnosis not present

## 2022-09-07 DIAGNOSIS — K219 Gastro-esophageal reflux disease without esophagitis: Secondary | ICD-10-CM | POA: Diagnosis present

## 2022-09-07 LAB — DIFFERENTIAL
Abs Immature Granulocytes: 0.05 10*3/uL (ref 0.00–0.07)
Basophils Absolute: 0 10*3/uL (ref 0.0–0.1)
Basophils Relative: 0 %
Eosinophils Absolute: 0 10*3/uL (ref 0.0–0.5)
Eosinophils Relative: 0 %
Immature Granulocytes: 1 %
Lymphocytes Relative: 7 %
Lymphs Abs: 0.7 10*3/uL (ref 0.7–4.0)
Monocytes Absolute: 0.4 10*3/uL (ref 0.1–1.0)
Monocytes Relative: 4 %
Neutro Abs: 8.9 10*3/uL — ABNORMAL HIGH (ref 1.7–7.7)
Neutrophils Relative %: 88 %

## 2022-09-07 LAB — I-STAT CHEM 8, ED
BUN: 28 mg/dL — ABNORMAL HIGH (ref 8–23)
Calcium, Ion: 0.86 mmol/L — CL (ref 1.15–1.40)
Chloride: 105 mmol/L (ref 98–111)
Creatinine, Ser: 1 mg/dL (ref 0.44–1.00)
Glucose, Bld: 128 mg/dL — ABNORMAL HIGH (ref 70–99)
HCT: 45 % (ref 36.0–46.0)
Hemoglobin: 15.3 g/dL — ABNORMAL HIGH (ref 12.0–15.0)
Potassium: 4.3 mmol/L (ref 3.5–5.1)
Sodium: 135 mmol/L (ref 135–145)
TCO2: 24 mmol/L (ref 22–32)

## 2022-09-07 LAB — COMPREHENSIVE METABOLIC PANEL
ALT: 9 U/L (ref 0–44)
AST: 19 U/L (ref 15–41)
Albumin: 3.8 g/dL (ref 3.5–5.0)
Alkaline Phosphatase: 62 U/L (ref 38–126)
Anion gap: 11 (ref 5–15)
BUN: 21 mg/dL (ref 8–23)
CO2: 26 mmol/L (ref 22–32)
Calcium: 9 mg/dL (ref 8.9–10.3)
Chloride: 99 mmol/L (ref 98–111)
Creatinine, Ser: 1.09 mg/dL — ABNORMAL HIGH (ref 0.44–1.00)
GFR, Estimated: 46 mL/min — ABNORMAL LOW (ref >60)
Glucose, Bld: 149 mg/dL — ABNORMAL HIGH (ref 70–99)
Potassium: 4 mmol/L (ref 3.5–5.1)
Sodium: 136 mmol/L (ref 135–145)
Total Bilirubin: 0.7 mg/dL (ref 0.3–1.2)
Total Protein: 6.7 g/dL (ref 6.5–8.1)

## 2022-09-07 LAB — ETHANOL: Alcohol, Ethyl (B): <10 mg/dL (ref <10)

## 2022-09-07 LAB — APTT: aPTT: 25 seconds (ref 24–36)

## 2022-09-07 LAB — HEMOGLOBIN A1C
Hgb A1c MFr Bld: 5.7 % — ABNORMAL HIGH (ref 4.8–5.6)
Mean Plasma Glucose: 116.89 mg/dL

## 2022-09-07 LAB — CBC
HCT: 47 % — ABNORMAL HIGH (ref 36.0–46.0)
Hemoglobin: 15.8 g/dL — ABNORMAL HIGH (ref 12.0–15.0)
MCH: 32.5 pg (ref 26.0–34.0)
MCHC: 33.6 g/dL (ref 30.0–36.0)
MCV: 96.7 fL (ref 80.0–100.0)
Platelets: 212 10*3/uL (ref 150–400)
RBC: 4.86 MIL/uL (ref 3.87–5.11)
RDW: 12.8 % (ref 11.5–15.5)
WBC: 10.1 10*3/uL (ref 4.0–10.5)
nRBC: 0 % (ref 0.0–0.2)

## 2022-09-07 LAB — CBG MONITORING, ED: Glucose-Capillary: 126 mg/dL — ABNORMAL HIGH (ref 70–99)

## 2022-09-07 LAB — PROTIME-INR
INR: 1 (ref 0.8–1.2)
Prothrombin Time: 12.8 seconds (ref 11.4–15.2)

## 2022-09-07 MED ORDER — ACETAMINOPHEN 650 MG RE SUPP: 650.0000 mg | RECTAL | Status: DC | PRN

## 2022-09-07 MED ORDER — ONDANSETRON HCL 4 MG/2ML IJ SOLN
INTRAMUSCULAR | Status: AC
  Administered 2022-09-07: 4 mg via INTRAVENOUS
  Filled 2022-09-07: qty 2

## 2022-09-07 MED ORDER — SODIUM CHLORIDE 0.9% FLUSH: 3.0000 mL | Freq: Once | INTRAVENOUS | Status: DC

## 2022-09-07 MED ORDER — SODIUM CHLORIDE 0.9 % IV SOLN: INTRAVENOUS | Status: DC

## 2022-09-07 MED ORDER — NICARDIPINE HCL IN NACL 20-0.86 MG/200ML-% IV SOLN: 0.0000 mg/h | INTRAVENOUS | Status: DC | PRN

## 2022-09-07 MED ORDER — PANTOPRAZOLE SODIUM 40 MG IV SOLR
40.0000 mg | Freq: Every day | INTRAVENOUS | Status: DC
  Administered 2022-09-08 (×2): 40 mg via INTRAVENOUS
  Filled 2022-09-07: qty 10

## 2022-09-07 MED ORDER — IOHEXOL 350 MG/ML SOLN
75.0000 mL | Freq: Once | INTRAVENOUS | Status: AC | PRN
  Administered 2022-09-07: 75 mL via INTRAVENOUS

## 2022-09-07 MED ORDER — ACETAMINOPHEN 160 MG/5ML PO SOLN: 650.0000 mg | ORAL | Status: DC | PRN

## 2022-09-07 MED ORDER — ACETAMINOPHEN 325 MG PO TABS: 650.0000 mg | ORAL_TABLET | ORAL | Status: DC | PRN

## 2022-09-07 MED ORDER — STROKE: EARLY STAGES OF RECOVERY BOOK: Freq: Once | Status: AC

## 2022-09-07 MED ORDER — TENECTEPLASE FOR STROKE
0.2500 mg/kg | PACK | Freq: Once | INTRAVENOUS | Status: AC
  Administered 2022-09-07: 17 mg via INTRAVENOUS
  Filled 2022-09-07: qty 10

## 2022-09-07 MED ORDER — LABETALOL HCL 5 MG/ML IV SOLN: 10.0000 mg | Freq: Once | INTRAVENOUS | Status: DC | PRN

## 2022-09-07 MED ORDER — ONDANSETRON HCL 4 MG/2ML IJ SOLN: 4.0000 mg | Freq: Once | INTRAMUSCULAR | Status: AC

## 2022-09-07 NOTE — Code Documentation (Signed)
  Stroke Response Nurse Documentation  Code Documentation  Raven Peterson is a 88 y.o. female arriving to [facility]  via [facility] on 09/07/22 with past medical hx of stroke, afib- not on anticoagulation. Code stroke was activated by EMS. Patient from [facility] on [place] where she was LKW at 1830 and found face down unresponsive with L gaze at 1930.   Stroke team at the bedside on patient arrival. Labs drawn and patient cleared for CT. Patient to CT with team. NIHSS 6, see documentation for details and code stroke times. Patient with disoriented, right arm weakness, Expressive aphasia , and dysarthria  on exam. The following imaging was completed:  CT Head and CTA. Patient is a candidate for IV Thrombolytic due to LKW 1830, CTA- Positive for emergent large vessel occlusion. Patient is not a candidate for IR due to DNR status and does not want aggressive procedural interventions. TNK given at 2150.  Pt with episode of emesis in CT. Zofran given at 2046.   Care Plan: Post TNK vitals and NIHSS. Admit to 4N-ICU  Bedside handoff with ED RN Paden.    Sherilyn Dacosta  Rapid/Stroke Response RN

## 2022-09-07 NOTE — H&P (Signed)
Neurology H&P  CC: Right-sided weakness  History is obtained from: Patient  HPI: Raven Peterson is a 88 y.o. female with a history of stroke and atrial fibrillation who is not on anticoagulation due to falls.  She was in her normal state of health earlier, and went to dinner at 5 PM.  She was normal at that time.  Subsequently she was discovered at 7 PM to be unsteady, and had to be assisted to get back in bed which is very unusual for her.  There was initial report of 7 PM on the code stroke activation as her last known well, but this was established as her first seen abnormal.   Subsequently at the facility she had decompensation and was found to have left gaze deviation with complete unresponsiveness.  EMS was called at that time, on EMS arrival she was improving and was able to tell me her name, though she was dysarthric and some of her speech did not make sense.  This is further complicated by the fact that she is extremely hard of hearing.  My initial attempts to contact the facility failed, as well as my initial attempt to contact her daughter.  Her chart from the facility listed stroke with aphasia is one of her problems and so her baseline was unclear and she has is clearly rapidly improving and etiology of the unresponsiveness with gaze deviation was unclear.  A CT/CTA was performed which did demonstrate a distal basilar occlusion, but last known well was still markedly unclear at this point.  I was finally able to get a hold of the facility to confirm that she did attend dinner from 5-6 30 and was normal at 630 when she returned to her room.  Following this additional information, I discussed with her daughter the risks, benefits, and alternatives to IV tenecteplase and decision was made to proceed with the medication.   LKW: 6:30 PM TNK given?:  Yes, there was delay due to IR Thrombectomy? No, DNR, does not want aggressive procedural interventions. Modified Rankin Scale: 2-Slight  disability-UNABLE to perform all activities but does not need assistance  NIHSS score: 10 1A: Level of Consciousness - 0 1B: Ask Month and Age - 1 1C: 'Blink Eyes' & 'Squeeze Hands' - 0 2: Test Horizontal Extraocular Movements - 0 3: Test Visual Fields - 2(though this did wax and wane, she initially appeared to have a right hemianopia) 4: Test Facial Palsy - 1(left facial droop) 5A: Test Left Arm Motor Drift - 0 5B: Test Right Arm Motor Drift - 1 6A: Test Left Leg Motor Drift - 0 6B: Test Right Leg Motor Drift - 0 7: Test Limb Ataxia - 1 8: Test Sensation - 0 9: Test Language/Aphasia- 2 10: Test Dysarthria - 2 11: Test Extinction/Inattention - 0     Past Medical History:  Diagnosis Date   Acute heart failure with preserved ejection fraction (HFpEF) ([place]) 01/13/2021   Acute ischemic stroke ([place]) 10/02/2014   right cerebellar and left posterior temporal CVA: This was confirmed on MRI. We suspected an embolic source given the distribution on MRI. She has no history of atrial fib and no atrial fib was seen on telemetry. She was seen by neurology in consultation and underwent carotid US which showed mild right carotid stenosis (<50%) and moderate left carotid stenosis (50-69%). She was treated with aspiri   Acute on chronic heart failure with preserved ejection fraction (HFpEF) ([place])    CAP (community acquired pneumonia) 03/17/2015  Chronic back pain    "used to get shots in my back by a Pain Management doctor"   COVID-19    Dyspnea    GERD (gastroesophageal reflux disease)    Glaucoma    Hyperlipidemia    Hypertension    Hypothyroidism    Ileus ([place]) 05/30/2019   Inferior pubic ramus fracture, right, with routine healing, subsequent encounter    Pneumonia due to COVID-19 virus 12/10/2020   SBO (small bowel obstruction) ([place]) 05/30/2019   Seizure ([place]) 10/02/2014   Small bowel obstruction ([place])    Thyroid disease    UTI from E.coli 2018   Vertebral compression fracture ([place])       Family History  Problem Relation Age of Onset   Healthy Mother    Asthma Father      Social History:  reports that she has never smoked. She has never used smokeless tobacco. She reports that she does not drink alcohol and does not use drugs.   Prior to Admission medications   Medication Sig Start Date End Date Taking? Authorizing Provider  apixaban (ELIQUIS) 2.5 MG TABS tablet Take 2.5 mg by mouth in the morning and at bedtime. Reported on 04/03/2016 06/16/15 01/13/21  [provider]  aspirin 81 MG chewable tablet Chew 81 mg by mouth daily.    [provider]  atorvastatin (LIPITOR) 40 MG tablet Take 40 mg by mouth daily.    [provider]  benzonatate (TESSALON) 100 MG capsule Take 1 capsule (100 mg total) by mouth every 8 (eight) hours. Patient taking differently: Take 100 mg by mouth every 8 (eight) hours as needed for cough. 07/07/18   Zigmund Gottron, NP  Ensure (ENSURE) Take 237 mLs by mouth 3 (three) times daily between meals.    [provider]  fluticasone (FLONASE) 50 MCG/ACT nasal spray Place 1 spray into both nostrils at bedtime.    [provider]  furosemide (LASIX) 40 MG tablet Take 1 tablet (40 mg total) by mouth daily. 01/17/21 01/17/22  Lacinda Axon, MD  gabapentin (NEURONTIN) 100 MG capsule Take 100 mg by mouth at bedtime.    [provider]  HYDROcodone-acetaminophen (NORCO/VICODIN) 5-325 MG tablet Take 1 tablet by mouth 3 (three) times daily as needed for moderate pain. Patient taking differently: Take 1 tablet by mouth every 4 (four) hours. 06/03/19   Arrien, Jimmy Picket, MD  levETIRAcetam (KEPPRA) 500 MG tablet Take 500 mg by mouth 2 (two) times daily.    [provider]  levothyroxine (SYNTHROID, LEVOTHROID) 25 MCG tablet Take 25 mcg by mouth daily before breakfast.  07/13/15   [provider]  LORazepam (ATIVAN) 0.5 MG tablet Take 0.25 mg by mouth See admin instructions. 0.25mg  oral in  the evening And 0.25mg  oral every 12 hours as needed for breakthrough anxiety    [provider]  methocarbamol (ROBAXIN) 500 MG tablet Take 500 mg by mouth 2 (two) times daily as needed for muscle spasms.    [provider]  miconazole (MICOTIN) 2 % cream Apply 1 application topically as needed (gluteal redness).    [provider]  montelukast (SINGULAIR) 10 MG tablet Take 10 mg by mouth at bedtime.    [provider]  Morphine Sulfate (MORPHINE CONCENTRATE) 10 mg / 0.5 ml concentrated solution Take 5 mg by mouth every 4 (four) hours as needed for severe pain (dyspnea).    [provider]  omeprazole (PRILOSEC) 20 MG capsule Take 20 mg by mouth at  bedtime.    [provider]  ondansetron (ZOFRAN) 4 MG tablet Take 4 mg by mouth every 6 (six) hours as needed for nausea or vomiting.    [provider]  polyethylene glycol (MIRALAX / GLYCOLAX) 17 g packet Take 17 g by mouth daily. 06/03/19   Arrien, Jimmy Picket, MD  SENNA PLUS 50-8.6 MG CAPS Take 1 capsule by mouth at bedtime. 12/20/20   [provider]  sertraline (ZOLOFT) 50 MG tablet Take 50 mg by mouth daily.    [provider]     Exam: Current vital signs: BP (!) 173/78   Pulse 93   Resp 20   Ht 5\' 4"  (1.626 m)   Wt 69.4 kg   SpO2 94%   BMI 26.26 kg/m    Physical Exam  Constitutional: Appears well-developed and well-nourished.  Psych: Affect appropriate to situation Eyes: No scleral injection HENT: No OP obstrucion Head: Normocephalic.  Cardiovascular: Normal rate and regular rhythm.  Respiratory: Effort normal and breath sounds normal to anterior ascultation GI: Soft.  No distension. There is no tenderness.  Skin: WDI  Neuro: Mental Status: Patient is awake, alert, she is severely dysarthric in addition to an accident, is able to tell me her name and gives her age is 45.  She does not answer many questions, and appears to be aphasic, with  expressive difficulty, not just dysarthria Cranial Nerves: II: She does not cooperate with visual field testing, but does not appear to blink to threat from the right, but does reliably blink from the left. Pupils are equal, round, and reactive to light.   III,IV, VI: EOMI with significant saccadic intrusion into smooth pursuit V: Facial sensation is symmetric to temperature VII: Facial movement with left facial weakness VIII: hearing is intact to voice Motor: She is able to hold all extremities aloft without drift Sensory: Sensation is symmetric to light touch and temperature in the arms and legs. 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   Secondary Diagnosis: Paroxysmal atrial fibrillation   Impression: 88 year old female that she of atrial fibrillation not on anticoagulation due to falls who presents with distal basilar occlusion.  She received IV tenecteplase after discussion of the risks and benefits and alternatives with the patient's daughter, with delays for the reasons noted above.  Following TNK administration, she has had significant improvement.  Plan: - HgbA1c, fasting lipid panel - MRI of the brain without contrast - Frequent neuro checks - Echocardiogram - Prophylactic therapy-none for 24 hours - Risk factor modification - Telemetry monitoring - PT consult, OT consult, Speech consult - Stroke team to follow    This patient is critically ill and at significant risk of neurological worsening, death and care requires constant monitoring of vital signs, hemodynamics,respiratory and cardiac monitoring, neurological assessment, discussion with family, other specialists and medical decision making of high  complexity. I spent 60 minutes of neurocritical care time  in the care of  this patient. This was time spent independent of any time provided by nurse practitioner or PA.  Roland Rack, MD [facility] [phone]  If 7pm- 7am, please page neurology on call as listed in [place].

## 2022-09-07 NOTE — Progress Notes (Signed)
PHARMACIST CODE STROKE RESPONSE  Notified to mix TNK at 21:44 by Dr. Leonel Ramsay TNK preparation completed at 21:48  TNK dose = 17 mg IV over 5 seconds.   Issues/delays encountered (if applicable):  Decision to administer TNK was delayed due to difficulty determining last known well time, awaiting CT perfusion read, and determination of basilar occlusion. Pharmacist notified to mix at 21:44, however once TNK was mixed, last BP was taken at 21:30 and BP cuff had been disconnected. BP was 172/85 at 21:50, so TNK was administered at that time.   Luisa Hart, PharmD, BCPS Clinical Pharmacist 09/07/2022 9:59 PM

## 2022-09-07 NOTE — ED Provider Notes (Addendum)
[facility] DEPARTMENT Provider Note   CSN: [id number] Arrival date & time: 09/07/22  2031  An emergency department physician performed an initial assessment on this suspected stroke patient at 2035.  History  Chief Complaint  Patient presents with   Code Stroke    Raven Peterson is a 88 y.o. female.  Patient is a 88 year old female who presents as a code stroke.  She was found to be slumped over a chair about 6 PM this evening.  She had some gaze deficit and aphasia.  She did not anticoagulants.       Home Medications Prior to Admission medications   Medication Sig Start Date End Date Taking? Authorizing Provider  apixaban (ELIQUIS) 2.5 MG TABS tablet Take 2.5 mg by mouth in the morning and at bedtime. Reported on 04/03/2016 06/16/15 01/13/21  [provider]  aspirin 81 MG chewable tablet Chew 81 mg by mouth daily.    [provider]  atorvastatin (LIPITOR) 40 MG tablet Take 40 mg by mouth daily.    [provider]  benzonatate (TESSALON) 100 MG capsule Take 1 capsule (100 mg total) by mouth every 8 (eight) hours. Patient taking differently: Take 100 mg by mouth every 8 (eight) hours as needed for cough. 07/07/18   Zigmund Gottron, NP  Ensure (ENSURE) Take 237 mLs by mouth 3 (three) times daily between meals.    [provider]  fluticasone (FLONASE) 50 MCG/ACT nasal spray Place 1 spray into both nostrils at bedtime.    [provider]  furosemide (LASIX) 40 MG tablet Take 1 tablet (40 mg total) by mouth daily. 01/17/21 01/17/22  Lacinda Axon, MD  gabapentin (NEURONTIN) 100 MG capsule Take 100 mg by mouth at bedtime.    [provider]  HYDROcodone-acetaminophen (NORCO/VICODIN) 5-325 MG tablet Take 1 tablet by mouth 3 (three) times daily as needed for moderate pain. Patient taking differently: Take 1 tablet by mouth every 4 (four) hours. 06/03/19   Arrien, Jimmy Picket, MD  levETIRAcetam (KEPPRA)  500 MG tablet Take 500 mg by mouth 2 (two) times daily.    [provider]  levothyroxine (SYNTHROID, LEVOTHROID) 25 MCG tablet Take 25 mcg by mouth daily before breakfast.  07/13/15   [provider]  LORazepam (ATIVAN) 0.5 MG tablet Take 0.25 mg by mouth See admin instructions. 0.25mg  oral in the evening And 0.25mg  oral every 12 hours as needed for breakthrough anxiety    [provider]  methocarbamol (ROBAXIN) 500 MG tablet Take 500 mg by mouth 2 (two) times daily as needed for muscle spasms.    [provider]  miconazole (MICOTIN) 2 % cream Apply 1 application topically as needed (gluteal redness).    [provider]  montelukast (SINGULAIR) 10 MG tablet Take 10 mg by mouth at bedtime.    [provider]  Morphine Sulfate (MORPHINE CONCENTRATE) 10 mg / 0.5 ml concentrated solution Take 5 mg by mouth every 4 (four) hours as needed for severe pain (dyspnea).    [provider]  omeprazole (PRILOSEC) 20 MG capsule Take 20 mg by mouth at bedtime.    [provider]  ondansetron (ZOFRAN) 4 MG tablet Take 4 mg by mouth every 6 (six) hours as needed for nausea or vomiting.    [provider]  polyethylene glycol (MIRALAX / GLYCOLAX) 17 g packet Take 17 g by mouth daily. 06/03/19   Arrien, Jimmy Picket, MD  SENNA PLUS 50-8.6 MG CAPS Take  1 capsule by mouth at bedtime. 12/20/20   [provider]  sertraline (ZOLOFT) 50 MG tablet Take 50 mg by mouth daily.    [provider]      Allergies    Cimetidine, Naproxen sodium, Fluzone [influenza virus vaccine], Fosamax [alendronate sodium], Loratadine, and Iodinated contrast media    Review of Systems   Review of Systems  Unable to perform ROS: Acuity of condition    Physical Exam Updated Vital Signs BP 111/63   Pulse 92   Temp (!) 97.1 F (36.2 C)   Resp (!) 24   Ht 5\' 4"  (1.626 m)   Wt 69.4 kg   SpO2 99%   BMI 26.26 kg/m  Physical  Exam Constitutional:      Appearance: She is well-developed.  HENT:     Head: Normocephalic and atraumatic.  Eyes:     Pupils: Pupils are equal, round, and reactive to light.  Cardiovascular:     Rate and Rhythm: Normal rate and regular rhythm.     Heart sounds: Murmur heard.  Pulmonary:     Effort: Pulmonary effort is normal. No respiratory distress.     Breath sounds: Normal breath sounds. No wheezing or rales.  Chest:     Chest wall: No tenderness.  Abdominal:     General: Bowel sounds are normal.     Palpations: Abdomen is soft.     Tenderness: There is no abdominal tenderness. There is no guarding or rebound.  Musculoskeletal:        General: Normal range of motion.     Cervical back: Normal range of motion and neck supple.  Lymphadenopathy:     Cervical: No cervical adenopathy.  Skin:    General: Skin is warm and dry.     Findings: No rash.  Neurological:     Mental Status: She is alert.     Comments: Aphasia present mild facial drooping.  No pronator drift.  Motor function seems symmetric.     ED Results / Procedures / Treatments   Labs (all labs ordered are listed, but only abnormal results are displayed) Labs Reviewed  CBC - Abnormal; Notable for the following components:      Result Value   Hemoglobin 15.8 (*)    HCT 47.0 (*)    All other components within normal limits  DIFFERENTIAL - Abnormal; Notable for the following components:   Neutro Abs 8.9 (*)    All other components within normal limits  COMPREHENSIVE METABOLIC PANEL - Abnormal; Notable for the following components:   Glucose, Bld 149 (*)    Creatinine, Ser 1.09 (*)    GFR, Estimated 46 (*)    All other components within normal limits  HEMOGLOBIN A1C - Abnormal; Notable for the following components:   Hgb A1c MFr Bld 5.7 (*)    All other components within normal limits  I-STAT CHEM 8, ED - Abnormal; Notable for the following components:   BUN 28 (*)    Glucose, Bld 128 (*)    Calcium, Ion  0.86 (*)    Hemoglobin 15.3 (*)    All other components within normal limits  CBG MONITORING, ED - Abnormal; Notable for the following components:   Glucose-Capillary 126 (*)    All other components within normal limits  PROTIME-INR  APTT  ETHANOL  LIPID PANEL    EKG None  Radiology DG Chest Portable 1 View  Result Date: 09/07/2022 CLINICAL DATA:  Possible aspiration EXAM: PORTABLE CHEST 1 VIEW COMPARISON:  01/13/2021 FINDINGS: Heart is normal size. Diffuse interstitial prominence throughout the lungs, similar prior study. Favor chronic interstitial lung disease. No effusions or acute bony abnormality. Aortic atherosclerosis. IMPRESSION: Diffuse interstitial prominence throughout the lungs, favor chronic interstitial lung disease. Electronically Signed   By: Rolm Baptise M.D.   On: 09/07/2022 22:09   CT ANGIO HEAD NECK W WO CM (CODE STROKE)  Result Date: 09/07/2022 CLINICAL DATA:  Neuro deficit, acute, stroke suspected. EXAM: CT ANGIOGRAPHY HEAD AND NECK TECHNIQUE: Multidetector CT imaging of the head and neck was performed using the standard protocol during bolus administration of intravenous contrast. Multiplanar CT image reconstructions and MIPs were obtained to evaluate the vascular anatomy. Carotid stenosis measurements (when applicable) are obtained utilizing NASCET criteria, using the distal internal carotid diameter as the denominator. RADIATION DOSE REDUCTION: This exam was performed according to the departmental dose-optimization program which includes automated exposure control, adjustment of the mA and/or kV according to patient size and/or use of iterative reconstruction technique. CONTRAST:  22mL OMNIPAQUE IOHEXOL 350 MG/ML SOLN COMPARISON:  None Available. FINDINGS: CTA NECK FINDINGS Aortic arch: Standard 3 vessel aortic arch with moderately extensive calcified and soft plaque. No flow limiting stenosis of the arch vessel origins. Right carotid system: Patent with a moderate  amount of calcified plaque at the carotid bifurcation resulting in severe stenosis of the ECA origin. No significant common or internal carotid artery stenosis. Left carotid system: Patent with a small amount of calcified plaque at the carotid bifurcation. No evidence of a significant stenosis or dissection. Vertebral arteries: Patent with the left being strongly dominant. Mild atherosclerotic irregularity of the left vertebral artery without significant stenosis. Severe stenosis of the right vertebral origin. Mild right V3 segment stenosis. Skeleton: Moderate lower cervical disc degeneration. Moderately advanced mid upper cervical facet arthrosis with bilateral facet ankylosis at C2-3. Other neck: No evidence of cervical lymphadenopathy or mass. Upper chest: Mosaic attenuation, mild interlobular septal thickening, and bronchial wall thickening in the included upper lobes. Review of the MIP images confirms the above findings CTA HEAD FINDINGS Anterior circulation: The internal carotid arteries are patent from skull base to carotid termini with calcified plaque resulting in mild cavernous and moderate paraclinoid stenosis bilaterally. ACAs and MCAs are patent with relatively widespread atherosclerosis involving the branch vessels including severe bilateral M2 and A2 stenoses. There are also severe right A1 and mild left M1 stenoses. No aneurysm is identified. Posterior circulation: The intracranial left vertebral artery is patent and supplies the basilar. The right vertebral artery ends in PICA. The basilar artery is patent proximally, however there is a filling defect in the distal basilar artery consistent with thrombus which extends into the proximal right P1 segment. There is reconstitution of the right P1 segment more distally, however there is additional occlusion of the right PCA at the P1-P2 junction with distal reconstitution of the distal P2 segment and P3 branches. The left PCA is patent, however its  origin is severely narrowed by the distal basilar thrombus, and there are also severe left P2 and P3 stenoses. No aneurysm is identified. Venous sinuses: As permitted by contrast timing, patent. Anatomic variants: None. Review of the MIP images confirms the above findings These results were communicated to Dr. Leonel Ramsay at 9:15 pm on 09/07/2022 by text page via the [facility] messaging system. IMPRESSION: 1. 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 2. Additional occlusion of the right PCA at the P1-P2 junction with distal  reconstitution. 3. Intracranial atherosclerosis including severe bilateral M2 and A2 stenoses and mild-to-moderate ICA stenoses. 4. Severe stenosis of the origin of the nondominant right vertebral artery. 5.  Aortic Atherosclerosis (ICD10-I70.0). Electronically Signed   By: Logan Bores M.D.   On: 09/07/2022 21:34   CT HEAD CODE STROKE WO CONTRAST  Result Date: 09/07/2022 CLINICAL DATA:  Code stroke. EXAM: CT HEAD WITHOUT CONTRAST TECHNIQUE: Contiguous axial images were obtained from the base of the skull through the vertex without intravenous contrast. RADIATION DOSE REDUCTION: This exam was performed according to the departmental dose-optimization program which includes automated exposure control, adjustment of the mA and/or kV according to patient size and/or use of iterative reconstruction technique. COMPARISON:  09/27/2021 FINDINGS: Brain: No evidence of acute infarction, hemorrhage, cerebral edema, mass, mass effect, or midline shift. No hydrocephalus or extra-axial collection. Periventricular white matter changes, likely the sequela of chronic small vessel ischemic disease. Remote lacunar infarct in the right lentiform nucleus. Vascular: No hyperdense vessel. Atherosclerotic calcifications in the intracranial carotid and vertebral arteries. Skull: Negative for fracture or focal lesion. Sinuses/Orbits: No acute  finding. Other: The mastoid air cells are well aerated. ASPECTS [facility] Stroke Program Early CT Score) - Ganglionic level infarction (caudate, lentiform nuclei, internal capsule, insula, M1-M3 cortex): 7 - Supraganglionic infarction (M4-M6 cortex): 3 Total score (0-10 with 10 being normal): 10 IMPRESSION: 1. No acute intracranial process. 2. ASPECTS is 10 Code stroke imaging results were communicated on 09/07/2022 at 8:47 pm to provider Dr. Leonel Ramsay via secure text paging. Electronically Signed   By: Merilyn Baba M.D.   On: 09/07/2022 20:48    Procedures Procedures    Medications Ordered in ED Medications  sodium chloride flush (NS) 0.9 % injection 3 mL (has no administration in time range)   stroke: early stages of recovery book (has no administration in time range)  0.9 %  sodium chloride infusion (has no administration in time range)  acetaminophen (TYLENOL) tablet 650 mg (has no administration in time range)    Or  acetaminophen (TYLENOL) 160 MG/5ML solution 650 mg (has no administration in time range)    Or  acetaminophen (TYLENOL) suppository 650 mg (has no administration in time range)  labetalol (NORMODYNE) injection 10 mg (has no administration in time range)    And  nicardipine (CARDENE) 20mg  in 0.86% saline 293ml IV infusion (0.1 mg/ml) (has no administration in time range)  pantoprazole (PROTONIX) injection 40 mg (has no administration in time range)  ondansetron (ZOFRAN) injection 4 mg (4 mg Intravenous Given 09/07/22 2110)  iohexol (OMNIPAQUE) 350 MG/ML injection 75 mL (75 mLs Intravenous Contrast Given 09/07/22 2101)  tenecteplase (TNKASE) injection for Stroke 17 mg (17 mg Intravenous Given 09/07/22 2150)    ED Course/ Medical Decision Making/ A&P                           Medical Decision Making Amount and/or Complexity of Data Reviewed Labs: ordered. Radiology: ordered.  Risk Decision regarding hospitalization.   Patient is 88 year old who presents as a code  stroke.  She was evaluated by neurology.  Her imaging studies reveal a large vessel occlusion.  Labs reviewed and are nonconcerning.  Chest x-ray was interpreted by me and confirmed by radiology to show no acute abnormality.  She was given tPA in the ED.  Will be admitted to the neurosurgical ICU.  CRITICAL CARE Performed by: Malvin Johns Total critical care time: 50 minutes Critical care time was exclusive  of separately billable procedures and treating other patients. Critical care was necessary to treat or prevent imminent or life-threatening deterioration. Critical care was time spent personally by me on the following activities: development of treatment plan with patient and/or surrogate as well as nursing, discussions with consultants, evaluation of patient's response to treatment, examination of patient, obtaining history from patient or surrogate, ordering and performing treatments and interventions, ordering and review of laboratory studies, ordering and review of radiographic studies, pulse oximetry and re-evaluation of patient's condition.   Final Clinical Impression(s) / ED Diagnoses Final diagnoses:  Cerebrovascular accident (CVA), unspecified mechanism [facility])    Rx / [place] Orders ED Discharge Orders     None         Malvin Johns, MD 09/07/22 2348    Malvin Johns, MD 09/07/22 2349

## 2022-09-08 ENCOUNTER — Inpatient Hospital Stay (HOSPITAL_COMMUNITY): Payer: Medicare Other

## 2022-09-08 DIAGNOSIS — I6312 Cerebral infarction due to embolism of basilar artery: Secondary | ICD-10-CM | POA: Diagnosis not present

## 2022-09-08 DIAGNOSIS — I6389 Other cerebral infarction: Secondary | ICD-10-CM | POA: Diagnosis not present

## 2022-09-08 LAB — ECHOCARDIOGRAM COMPLETE
AR max vel: 0.81 cm2
AV Area VTI: 0.72 cm2
AV Area mean vel: 0.76 cm2
AV Mean grad: 13.5 mmHg
AV Peak grad: 20.8 mmHg
Ao pk vel: 2.28 m/s
Area-P 1/2: 3.97 cm2
Height: 64 in
MV VTI: 1.31 cm2
S' Lateral: 3.5 cm
Weight: 2447.99 oz

## 2022-09-08 MED ORDER — LEVOTHYROXINE SODIUM 50 MCG PO TABS
50.0000 ug | ORAL_TABLET | Freq: Every day | ORAL | Status: DC
  Administered 2022-09-08 – 2022-09-11 (×4): 50 ug via ORAL
  Filled 2022-09-08: qty 2
  Filled 2022-09-08 (×3): qty 1

## 2022-09-08 MED ORDER — LEVETIRACETAM 500 MG PO TABS
500.0000 mg | ORAL_TABLET | Freq: Two times a day (BID) | ORAL | Status: DC
  Administered 2022-09-08 – 2022-09-11 (×7): 500 mg via ORAL
  Filled 2022-09-08 (×8): qty 1

## 2022-09-08 MED ORDER — SERTRALINE HCL 50 MG PO TABS
50.0000 mg | ORAL_TABLET | Freq: Every day | ORAL | Status: DC
  Administered 2022-09-08 – 2022-09-11 (×4): 50 mg via ORAL
  Filled 2022-09-08 (×4): qty 1

## 2022-09-08 NOTE — Progress Notes (Addendum)
STROKE TEAM PROGRESS NOTE   INTERVAL HISTORY Patient is seen in her room with her daughter at the bedside.  Yesterday, she was found to be unsteady and quickly developed unresponsiveness with left gaze deviation.  She was found to have a distal basilar occlusion on CTA and was given TNK with improvement in her symptoms.  She reportedly would not have wished to have aggressive interventions such as thrombectomy. MRI scan of the brain is pending.  Blood pressure adequately controlled.  Neurological exam is a lot improved. Vitals:   09/08/22 0900 09/08/22 1030 09/08/22 1130 09/08/22 1230  BP: (!) 160/70 (!) 159/72 (!) 144/69 (!) 136/101  Pulse: 94 91 89 84  Resp: 18 20 18 20   Temp:  97.7 F (36.5 C)    TempSrc:  Oral    SpO2: 98% 95% 95% 97%  Weight:      Height:       CBC:  Recent Labs  Lab 09/07/22 2054 09/07/22 2238  WBC  --  10.1  NEUTROABS  --  8.9*  HGB 15.3* 15.8*  HCT 45.0 47.0*  MCV  --  96.7  PLT  --  99991111   Basic Metabolic Panel:  Recent Labs  Lab 09/07/22 2054 09/07/22 2238  NA 135 136  K 4.3 4.0  CL 105 99  CO2  --  26  GLUCOSE 128* 149*  BUN 28* 21  CREATININE 1.00 1.09*  CALCIUM  --  9.0   Lipid Panel: No results for input(s): "CHOL", "TRIG", "HDL", "CHOLHDL", "VLDL", "LDLCALC" in the last 168 hours. HgbA1c:  Recent Labs  Lab 09/07/22 2238  HGBA1C 5.7*   Urine Drug Screen: No results for input(s): "LABOPIA", "COCAINSCRNUR", "LABBENZ", "AMPHETMU", "THCU", "LABBARB" in the last 168 hours.  Alcohol Level  Recent Labs  Lab 09/07/22 2238  ETH <10    IMAGING past 24 hours ECHOCARDIOGRAM COMPLETE  Result Date: 09/08/2022    ECHOCARDIOGRAM REPORT   Patient Name:   Raven Peterson Date of Exam: 09/08/2022 Medical Rec #:  [id number]    Height:       64.0 in Accession #:    [id number]   Weight:       153.0 lb Date of Birth:  1922/05/01    BSA:          1.746 m Patient Age:    88 years     BP:           164/73 mmHg Patient Gender: F            HR:           93  bpm. Exam Location:  Inpatient Procedure: 2D Echo, Cardiac Doppler and Color Doppler Indications:    Stroke 434.91 / I163.9  History:        Patient has prior history of Echocardiogram examinations, most                 recent 05/31/2019. Stroke, Signs/Symptoms:Dyspnea; Risk                 Factors:Hypertension and Dyslipidemia.  Sonographer:    Ronny Flurry Sonographer#2:  Melissa Morford RDCS (AE, PE) Referring Phys: [phone] MCNEILL P KIRKPATRICK  Sonographer Comments: Technically difficult study due to poor echo windows, suboptimal parasternal window and suboptimal apical window. IMPRESSIONS  1. Left ventricular ejection fraction, by estimation, is 60 to 65%. The left ventricle has normal function. The left ventricle has no regional wall motion abnormalities. There is severe left ventricular hypertrophy. Left  ventricular diastolic parameters  are indeterminate.  2. Right ventricular systolic function is normal. The right ventricular size is normal.  3. The mitral valve is normal in structure. Mild mitral valve regurgitation. No evidence of mitral stenosis. Severe mitral annular calcification.  4. The aortic valve has an indeterminant number of cusps. Aortic valve regurgitation is not visualized. Mild aortic valve stenosis.  5. The inferior vena cava is normal in size with greater than 50% respiratory variability, suggesting right atrial pressure of 3 mmHg. FINDINGS  Left Ventricle: Left ventricular ejection fraction, by estimation, is 60 to 65%. The left ventricle has normal function. The left ventricle has no regional wall motion abnormalities. The left ventricular internal cavity size was normal in size. There is  severe left ventricular hypertrophy. Left ventricular diastolic parameters are indeterminate. Right Ventricle: The right ventricular size is normal. Right ventricular systolic function is normal. Left Atrium: Left atrial size was normal in size. Right Atrium: Right atrial size was normal in size.  Pericardium: There is no evidence of pericardial effusion. Mitral Valve: The mitral valve is normal in structure. Severe mitral annular calcification. Mild mitral valve regurgitation. No evidence of mitral valve stenosis. MV peak gradient, 6.6 mmHg. The mean mitral valve gradient is 4.0 mmHg. Tricuspid Valve: The tricuspid valve is normal in structure. Tricuspid valve regurgitation is trivial. No evidence of tricuspid stenosis. Aortic Valve: The aortic valve has an indeterminant number of cusps. Aortic valve regurgitation is not visualized. Mild aortic stenosis is present. Aortic valve mean gradient measures 13.5 mmHg. Aortic valve peak gradient measures 20.8 mmHg. Aortic valve  area, by VTI measures 0.72 cm. Pulmonic Valve: The pulmonic valve was not well visualized. Pulmonic valve regurgitation is trivial. No evidence of pulmonic stenosis. Aorta: The aortic root is normal in size and structure. Venous: The inferior vena cava is normal in size with greater than 50% respiratory variability, suggesting right atrial pressure of 3 mmHg. IAS/Shunts: No atrial level shunt detected by color flow Doppler.  LEFT VENTRICLE PLAX 2D LVIDd:         3.80 cm   Diastology LVIDs:         3.50 cm   LV e' medial:    2.70 cm/s LV PW:         1.10 cm   LV E/e' medial:  56.9 LV IVS:        1.60 cm   LV e' lateral:   4.35 cm/s LVOT diam:     1.80 cm   LV E/e' lateral: 35.3 LV SV:         36 LV SV Index:   21 LVOT Area:     2.54 cm  RIGHT VENTRICLE RV S prime:     7.35 cm/s TAPSE (M-mode): 1.4 cm LEFT ATRIUM             Index        RIGHT ATRIUM           Index LA diam:        3.80 cm 2.18 cm/m   RA Area:     11.75 cm LA Vol (A2C):   39.5 ml 22.63 ml/m  RA Volume:   23.45 ml  13.43 ml/m LA Vol (A4C):   67.0 ml 38.38 ml/m LA Biplane Vol: 53.7 ml 30.76 ml/m  AORTIC VALVE AV Area (Vmax):    0.81 cm AV Area (Vmean):   0.76 cm AV Area (VTI):     0.72 cm AV Vmax:  228.00 cm/s AV Vmean:          175.000 cm/s AV VTI:             0.494 m AV Peak Grad:      20.8 mmHg AV Mean Grad:      13.5 mmHg LVOT Vmax:         72.47 cm/s LVOT Vmean:        52.533 cm/s LVOT VTI:          0.141 m LVOT/AV VTI ratio: 0.28  AORTA Ao Root diam: 2.70 cm Ao Asc diam:  3.00 cm MITRAL VALVE MV Area (PHT): 3.97 cm     SHUNTS MV Area VTI:   1.31 cm     Systemic VTI:  0.14 m MV Peak grad:  6.6 mmHg     Systemic Diam: 1.80 cm MV Mean grad:  4.0 mmHg MV Vmax:       1.28 m/s MV Vmean:      94.1 cm/s MV Decel Time: 191 msec MV E velocity: 153.50 cm/s MV A velocity: 112.00 cm/s MV E/A ratio:  1.37 Kirk Ruths MD Electronically signed by Kirk Ruths MD Signature Date/Time: 09/08/2022/12:42:29 PM    Final    DG Chest Portable 1 View  Result Date: 09/07/2022 CLINICAL DATA:  Possible aspiration EXAM: PORTABLE CHEST 1 VIEW COMPARISON:  01/13/2021 FINDINGS: Heart is normal size. Diffuse interstitial prominence throughout the lungs, similar prior study. Favor chronic interstitial lung disease. No effusions or acute bony abnormality. Aortic atherosclerosis. IMPRESSION: Diffuse interstitial prominence throughout the lungs, favor chronic interstitial lung disease. Electronically Signed   By: Rolm Baptise M.D.   On: 09/07/2022 22:09   CT ANGIO HEAD NECK W WO CM (CODE STROKE)  Result Date: 09/07/2022 CLINICAL DATA:  Neuro deficit, acute, stroke suspected. EXAM: CT ANGIOGRAPHY HEAD AND NECK TECHNIQUE: Multidetector CT imaging of the head and neck was performed using the standard protocol during bolus administration of intravenous contrast. Multiplanar CT image reconstructions and MIPs were obtained to evaluate the vascular anatomy. Carotid stenosis measurements (when applicable) are obtained utilizing NASCET criteria, using the distal internal carotid diameter as the denominator. RADIATION DOSE REDUCTION: This exam was performed according to the departmental dose-optimization program which includes automated exposure control, adjustment of the mA and/or kV according to  patient size and/or use of iterative reconstruction technique. CONTRAST:  35mL OMNIPAQUE IOHEXOL 350 MG/ML SOLN COMPARISON:  None Available. FINDINGS: CTA NECK FINDINGS Aortic arch: Standard 3 vessel aortic arch with moderately extensive calcified and soft plaque. No flow limiting stenosis of the arch vessel origins. Right carotid system: Patent with a moderate amount of calcified plaque at the carotid bifurcation resulting in severe stenosis of the ECA origin. No significant common or internal carotid artery stenosis. Left carotid system: Patent with a small amount of calcified plaque at the carotid bifurcation. No evidence of a significant stenosis or dissection. Vertebral arteries: Patent with the left being strongly dominant. Mild atherosclerotic irregularity of the left vertebral artery without significant stenosis. Severe stenosis of the right vertebral origin. Mild right V3 segment stenosis. Skeleton: Moderate lower cervical disc degeneration. Moderately advanced mid upper cervical facet arthrosis with bilateral facet ankylosis at C2-3. Other neck: No evidence of cervical lymphadenopathy or mass. Upper chest: Mosaic attenuation, mild interlobular septal thickening, and bronchial wall thickening in the included upper lobes. Review of the MIP images confirms the above findings CTA HEAD FINDINGS Anterior circulation: The internal carotid arteries are patent from skull base to carotid termini with  calcified plaque resulting in mild cavernous and moderate paraclinoid stenosis bilaterally. ACAs and MCAs are patent with relatively widespread atherosclerosis involving the branch vessels including severe bilateral M2 and A2 stenoses. There are also severe right A1 and mild left M1 stenoses. No aneurysm is identified. Posterior circulation: The intracranial left vertebral artery is patent and supplies the basilar. The right vertebral artery ends in PICA. The basilar artery is patent proximally, however there is a  filling defect in the distal basilar artery consistent with thrombus which extends into the proximal right P1 segment. There is reconstitution of the right P1 segment more distally, however there is additional occlusion of the right PCA at the P1-P2 junction with distal reconstitution of the distal P2 segment and P3 branches. The left PCA is patent, however its origin is severely narrowed by the distal basilar thrombus, and there are also severe left P2 and P3 stenoses. No aneurysm is identified. Venous sinuses: As permitted by contrast timing, patent. Anatomic variants: None. Review of the MIP images confirms the above findings These results were communicated to Dr. Leonel Ramsay at 9:15 pm on 09/07/2022 by text page via the [facility] messaging system. IMPRESSION: 1. 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 2. Additional occlusion of the right PCA at the P1-P2 junction with distal reconstitution. 3. Intracranial atherosclerosis including severe bilateral M2 and A2 stenoses and mild-to-moderate ICA stenoses. 4. Severe stenosis of the origin of the nondominant right vertebral artery. 5.  Aortic Atherosclerosis (ICD10-I70.0). Electronically Signed   By: Logan Bores M.D.   On: 09/07/2022 21:34   CT HEAD CODE STROKE WO CONTRAST  Result Date: 09/07/2022 CLINICAL DATA:  Code stroke. EXAM: CT HEAD WITHOUT CONTRAST TECHNIQUE: Contiguous axial images were obtained from the base of the skull through the vertex without intravenous contrast. RADIATION DOSE REDUCTION: This exam was performed according to the departmental dose-optimization program which includes automated exposure control, adjustment of the mA and/or kV according to patient size and/or use of iterative reconstruction technique. COMPARISON:  09/27/2021 FINDINGS: Brain: No evidence of acute infarction, hemorrhage, cerebral edema, mass, mass effect, or midline shift. No hydrocephalus or  extra-axial collection. Periventricular white matter changes, likely the sequela of chronic small vessel ischemic disease. Remote lacunar infarct in the right lentiform nucleus. Vascular: No hyperdense vessel. Atherosclerotic calcifications in the intracranial carotid and vertebral arteries. Skull: Negative for fracture or focal lesion. Sinuses/Orbits: No acute finding. Other: The mastoid air cells are well aerated. ASPECTS [facility] Stroke Program Early CT Score) - Ganglionic level infarction (caudate, lentiform nuclei, internal capsule, insula, M1-M3 cortex): 7 - Supraganglionic infarction (M4-M6 cortex): 3 Total score (0-10 with 10 being normal): 10 IMPRESSION: 1. No acute intracranial process. 2. ASPECTS is 10 Code stroke imaging results were communicated on 09/07/2022 at 8:47 pm to provider Dr. Leonel Ramsay via secure text paging. Electronically Signed   By: Merilyn Baba M.D.   On: 09/07/2022 20:48    PHYSICAL EXAM General:  Alert, well-developed, well-nourished elderly Caucasian lady in no acute distress Respiratory:  Regular, unlabored respirations on room air  NEURO:  Mental Status: AA&Ox2 Speech/Language: speech is without dysarthria or aphasia.  Fluency, and comprehension intact but hard of hearing  Cranial Nerves:  II: PERRL. Visual fields full.  III, IV, VI: EOMI. Eyelids elevate symmetrically.  V: Sensation is intact to light touch and symmetrical to face.  VII: Smile is symmetrical.   VIII: hearing intact to voice. IX, X: Phonation is normal.  XII: tongue is midline without fasciculations. Motor: 5/5 strength to all muscle groups tested.  Tone: is normal and bulk is normal Sensation- Intact to light touch bilaterally. Extinction absent to light touch to DSS.  Coordination: FTN intact bilaterally  Gait- deferred   ASSESSMENT/PLAN Raven Peterson is a 88 y.o. female with history of atrial fibrillation not on anticoagulation and stroke presenting after she was found to be  unsteady, requiring assistance to get into bed, and then quickly developed unresponsiveness with left gaze deviation.  She was found to have a distal basilar occlusion on CTA and was given TNK with improvement in her symptoms.  She reportedly would not have wished to have aggressive interventions such as thrombectomy.  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 ASPECTS 10.    CTA head & neck thrombus in distal basilar artery and proximal right P1 segment, occlusion of right PCA at P1-P2 junction, severe bilateral M2 and A2 stenoses MRI  pending 2D Echo EF 60-65%, severe LVH, no atrial level shunt LDL pending HgbA1c 5.7 VTE prophylaxis - SCDs    Diet   Diet NPO time specified   aspirin 81 mg daily prior to admission, now on No antithrombotic as she is <24 hours from TNK administration Therapy recommendations:  pending Disposition:  pending  Hypertension Home meds:  none Stable Keep BP <180/105 Long-term BP goal normotensive  Hyperlipidemia Home meds:  none LDL No results found for requested labs within last 1095 days., goal < 70 Add statin if LDL >70  Continue statin at discharge  Atrial fibrillation Patient has a history of a-fib Not on anticoagulation at home due to fall risk  Other Stroke Risk Factors Advanced Age >/= 29  Hx stroke  Other Active Problems none  Hospital day # [place] , MSN, AGACNP-BC [facility] See Amion for schedule and pager information 09/08/2022 1:18 PM   STROKE MD NOTE :  I have personally obtained history,examined this patient, reviewed notes, independently viewed imaging studies, participated in medical decision making and plan of care.ROS completed by me personally and pertinent positives fully documented  I have made any additions or  clarifications directly to the above note. Agree with note above.  She presented with sudden onset of dysarthria and ataxia due to distal basilar artery embolism likely from A-fib.  She was treated with IV thrombolysis with TNK with excellent clinical improvement.  Family refused mechanical thrombectomy.  Recommend close neurological observation and strict blood pressure control as per post TNK protocol.  Mobilize out of bed.  Therapy consults.  Continue ongoing stroke work-up.  Will need to discuss risk-benefit of anticoagulation with family carefully and make decision on starting it.  Check MRI scan of the brain later today.  Long discussion with daughter at the bedside and answered questions.This patient is critically ill and at significant risk of neurological worsening, death and care requires constant monitoring of vital signs, hemodynamics,respiratory and cardiac monitoring, extensive review of multiple databases, frequent neurological assessment, discussion with family, other specialists and medical decision making of high complexity.I have made any additions or clarifications directly to the above note.This critical care time does not reflect procedure time, or teaching time or supervisory time of PA/NP/Med Resident etc but could involve care discussion time.  I spent 30 minutes of neurocritical care time  in the care of  this patient.      Antony Contras, MD Medical Director [facility] Stroke Center Pager: [phone] 09/08/2022 4:13 PM   To contact Stroke Continuity provider, please refer to [email or web address] After hours, contact General Neurology

## 2022-09-08 NOTE — ED Notes (Signed)
Patient is awake and alert, able to verbalize her needs of being cold and the urge to void, warm blanket provided, purewick in place, patient able to follow commands, responds appropriately, is alert and oriented, at times slow to respond but due to hearing deficit, patient states her swallowing feels normal and is able to maintain her airway and clear secretions, will continue to monitor.

## 2022-09-08 NOTE — ED Notes (Signed)
Patient has been awake and alert and talking with family, she has been repositioned, fresh linen, voided, had a bowel movement, no s/s of distress, no new complaints, will continue to monitor.

## 2022-09-09 ENCOUNTER — Inpatient Hospital Stay (HOSPITAL_COMMUNITY): Payer: Medicare Other

## 2022-09-09 DIAGNOSIS — N3000 Acute cystitis without hematuria: Secondary | ICD-10-CM

## 2022-09-09 DIAGNOSIS — E782 Mixed hyperlipidemia: Secondary | ICD-10-CM | POA: Diagnosis not present

## 2022-09-09 DIAGNOSIS — I6312 Cerebral infarction due to embolism of basilar artery: Secondary | ICD-10-CM | POA: Diagnosis not present

## 2022-09-09 DIAGNOSIS — I482 Chronic atrial fibrillation, unspecified: Secondary | ICD-10-CM | POA: Diagnosis not present

## 2022-09-09 LAB — URINALYSIS, ROUTINE W REFLEX MICROSCOPIC
Bilirubin Urine: NEGATIVE
Glucose, UA: NEGATIVE mg/dL
Hgb urine dipstick: NEGATIVE
Ketones, ur: NEGATIVE mg/dL
Nitrite: NEGATIVE
Protein, ur: NEGATIVE mg/dL
Specific Gravity, Urine: 1.019 (ref 1.005–1.030)
pH: 6 (ref 5.0–8.0)

## 2022-09-09 LAB — BASIC METABOLIC PANEL
Anion gap: 10 (ref 5–15)
BUN: 15 mg/dL (ref 8–23)
CO2: 25 mmol/L (ref 22–32)
Calcium: 9.1 mg/dL (ref 8.9–10.3)
Chloride: 103 mmol/L (ref 98–111)
Creatinine, Ser: 1.1 mg/dL — ABNORMAL HIGH (ref 0.44–1.00)
GFR, Estimated: 46 mL/min — ABNORMAL LOW (ref >60)
Glucose, Bld: 111 mg/dL — ABNORMAL HIGH (ref 70–99)
Potassium: 4.6 mmol/L (ref 3.5–5.1)
Sodium: 138 mmol/L (ref 135–145)

## 2022-09-09 LAB — LIPID PANEL
Cholesterol: 230 mg/dL — ABNORMAL HIGH (ref 0–200)
HDL: 67 mg/dL (ref >40)
LDL Cholesterol: 148 mg/dL — ABNORMAL HIGH (ref 0–99)
Total CHOL/HDL Ratio: 3.4 RATIO
Triglycerides: 75 mg/dL (ref <150)
VLDL: 15 mg/dL (ref 0–40)

## 2022-09-09 LAB — CBC
HCT: 42.8 % (ref 36.0–46.0)
Hemoglobin: 14.6 g/dL (ref 12.0–15.0)
MCH: 32.7 pg (ref 26.0–34.0)
MCHC: 34.1 g/dL (ref 30.0–36.0)
MCV: 95.7 fL (ref 80.0–100.0)
Platelets: 230 10*3/uL (ref 150–400)
RBC: 4.47 MIL/uL (ref 3.87–5.11)
RDW: 12.9 % (ref 11.5–15.5)
WBC: 9.5 10*3/uL (ref 4.0–10.5)
nRBC: 0 % (ref 0.0–0.2)

## 2022-09-09 MED ORDER — ROSUVASTATIN CALCIUM 20 MG PO TABS
20.0000 mg | ORAL_TABLET | Freq: Every day | ORAL | Status: DC
  Administered 2022-09-09 – 2022-09-11 (×3): 20 mg via ORAL
  Filled 2022-09-09 (×3): qty 1

## 2022-09-09 MED ORDER — LABETALOL HCL 5 MG/ML IV SOLN
5.0000 mg | INTRAVENOUS | Status: DC | PRN
  Administered 2022-09-09: 5 mg via INTRAVENOUS
  Filled 2022-09-09: qty 4

## 2022-09-09 MED ORDER — ASPIRIN 81 MG PO TBEC
81.0000 mg | DELAYED_RELEASE_TABLET | Freq: Every day | ORAL | Status: DC
  Administered 2022-09-09 – 2022-09-11 (×3): 81 mg via ORAL
  Filled 2022-09-09 (×3): qty 1

## 2022-09-09 MED ORDER — SODIUM CHLORIDE 0.9 % IV SOLN
1.0000 g | INTRAVENOUS | Status: DC
  Administered 2022-09-09 – 2022-09-10 (×2): 1 g via INTRAVENOUS
  Filled 2022-09-09 (×4): qty 10

## 2022-09-09 MED ORDER — PANTOPRAZOLE SODIUM 40 MG PO TBEC
40.0000 mg | DELAYED_RELEASE_TABLET | Freq: Every day | ORAL | Status: DC
  Administered 2022-09-09 – 2022-09-11 (×3): 40 mg via ORAL
  Filled 2022-09-09 (×3): qty 1

## 2022-09-09 MED ORDER — HEPARIN SODIUM (PORCINE) 5000 UNIT/ML IJ SOLN
5000.0000 [IU] | Freq: Three times a day (TID) | INTRAMUSCULAR | Status: DC
  Administered 2022-09-09 – 2022-09-11 (×6): 5000 [IU] via SUBCUTANEOUS
  Filled 2022-09-09 (×7): qty 1

## 2022-09-09 MED ORDER — CHLORHEXIDINE GLUCONATE CLOTH 2 % EX PADS
6.0000 | MEDICATED_PAD | Freq: Every day | CUTANEOUS | Status: DC
  Administered 2022-09-09 – 2022-09-10 (×2): 6 via TOPICAL

## 2022-09-09 NOTE — Evaluation (Signed)
Occupational Therapy Evaluation Patient Details Name: Raven Peterson MRN: [id number] DOB: October 20, 1922 Today's Date: 09/09/2022   History of Present Illness The pt is a 88 yo female presenting 9/28 from [place] after being found down unresponsive with L gaze preference. CT showed distal basilar occulsion. Pt given TNK. 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   Clinical Impression   Raven Peterson was evaluated s/p the above admission list, she is from [facility]. Pt is a poor historian but reports she ambulates with rollator, sponge bathes and indep completes BADLs. Upon evaluation pt had functional deficits due to impaired cognition, generalized weakness, poor balance, decreased activity tolerance and was dis-oriented despite cues. Overall she needed min A for bed mobility, min A +2 for sit<>stand transfers and Topp mobility with RW. Due to deficits listed below pt needs up to mod A for LB ADLs and min A for UB ADLs. She will benefit from OT acutely. Recommend pt d/c back to ALF with HHOT.      Recommendations for follow up therapy are one component of a multi-disciplinary discharge planning process, led by the attending physician.  Recommendations may be updated based on patient status, additional functional criteria and insurance authorization.   Follow Up Recommendations  Home health OT (At [facility])    Assistance Recommended at Discharge Frequent or constant Supervision/Assistance  Patient can return home with the following A little help with walking and/or transfers;A little help with bathing/dressing/bathroom;Assistance with cooking/housework;Assistance with feeding;Direct supervision/assist for medications management;Direct supervision/assist for financial management;Assist for transportation;Help with stairs or ramp for entrance    Functional Status Assessment  Patient has had a recent  decline in their functional status and demonstrates the ability to make significant improvements in function in a reasonable and predictable amount of time.  Equipment Recommendations  None recommended by OT    Recommendations for Other Services       Precautions / Restrictions Precautions Precautions: Fall Precaution Comments: HOH Restrictions Weight Bearing Restrictions: No      Mobility Bed Mobility Overal bed mobility: Needs Assistance Bed Mobility: Supine to Sit     Supine to sit: Min assist     General bed mobility comments: increased time, assist for trunk elevation and balance    Transfers Overall transfer level: Needs assistance Equipment used: Rolling walker (2 wheels) Transfers: Sit to/from Stand Sit to Stand: Min assist, +2 physical assistance, +2 safety/equipment                  Balance Overall balance assessment: Needs assistance Sitting-balance support: Feet supported Sitting balance-Leahy Scale: Fair     Standing balance support: Bilateral upper extremity supported, During functional activity Standing balance-Leahy Scale: Poor                             ADL either performed or assessed with clinical judgement   ADL Overall ADL's : Needs assistance/impaired Eating/Feeding: Set up;Sitting   Grooming: Set up;Sitting   Upper Body Bathing: Minimal assistance;Sitting   Lower Body Bathing: Moderate assistance;Sit to/from stand   Upper Body Dressing : Set up;Sitting   Lower Body Dressing: Moderate assistance;Sit to/from stand   Toilet Transfer: Minimal assistance;+2 for safety/equipment;Ambulation;BSC/3in1;Rolling walker (2 wheels)   Toileting- Clothing Manipulation and Hygiene: Min guard;Sitting/lateral lean       Functional mobility during ADLs: Minimal assistance;+2 for safety/equipment;+2 for  physical assistance General ADL Comments: limited by impaired cognition, generalized weakness, poor balance and activity  tolerance     Vision Baseline Vision/History: 0 No visual deficits Vision Assessment?: No apparent visual deficits Additional Comments: pt stated "I cannot complain" & denied any vision changes            Pertinent Vitals/Pain Pain Assessment Pain Assessment: Faces Faces Pain Scale: Hurts a little bit Pain Location: generalized with bed mobility Pain Descriptors / Indicators: Discomfort Pain Intervention(s): Limited activity within patient's tolerance, Monitored during session     Hand Dominance Right   Extremity/Trunk Assessment Upper Extremity Assessment Upper Extremity Assessment: RUE deficits/detail;LUE deficits/detail RUE Deficits / Details: overall WFL for tasks assessed. globally weak. likely baseline LUE Deficits / Details: painful over head ROM, globally weak. denies paraesthesias.   Lower Extremity Assessment Lower Extremity Assessment: Defer to PT evaluation   Cervical / Trunk Assessment Cervical / Trunk Assessment: Kyphotic   Communication Communication Communication: HOH   Cognition Arousal/Alertness: Awake/alert Behavior During Therapy: Anxious Overall Cognitive Status: No family/caregiver present to determine baseline cognitive functioning Area of Impairment: Attention, Memory, Following commands, Safety/judgement, Awareness, Problem solving, Orientation                 Orientation Level: Disoriented to, Place Current Attention Level: Selective Memory: Decreased Raven Peterson memory Following Commands: Follows one step commands with increased time Safety/Judgement: Decreased awareness of deficits Awareness: Emergent, Intellectual Problem Solving: Slow processing, Decreased initiation, Requires verbal cues General Comments: stated [place] as place despite re-orientation. Pleasantly confused throughout. Followed most simple commands with increased time and cues.     General Comments  VSS on RA            Home Living Family/patient expects  to be discharged to:: Assisted living               Home Equipment: Rollator (4 wheels)   Additional Comments: pt from [facility] where she gets assist and uses rollator      Prior Functioning/Environment Prior Level of Function : Needs assist             Mobility Comments: uses rollator ADLs Comments: reports staff assist with ADLs, does not get into shower        OT Problem List: Decreased strength;Decreased range of motion;Decreased activity tolerance;Impaired balance (sitting and/or standing);Decreased cognition;Decreased safety awareness      OT Treatment/Interventions: Self-care/ADL training;Therapeutic exercise;DME and/or AE instruction;Therapeutic activities;Patient/family education;Balance training    OT Goals(Current goals can be found in the care plan section) Acute Rehab OT Goals Patient Stated Goal: to sleep OT Goal Formulation: With patient Time For Goal Achievement: 09/23/22 Potential to Achieve Goals: Good ADL Goals Pt Will Perform Grooming: with modified independence;standing Pt Will Perform Upper Body Dressing: with modified independence;sitting Pt Will Perform Lower Body Dressing: with supervision;sit to/from stand Pt Will Transfer to Toilet: with supervision;ambulating;bedside commode  OT Frequency: Min 2X/week    Co-evaluation   Reason for Co-Treatment: Necessary to address cognition/behavior during functional activity;For patient/therapist safety;To address functional/ADL transfers PT goals addressed during session: Mobility/safety with mobility;Balance;Proper use of DME        AM-PAC OT "6 Clicks" Daily Activity     Outcome Measure Help from another person eating meals?: A Little Help from another person taking care of personal grooming?: A Little Help from another person toileting, which includes using toliet, bedpan, or urinal?: A Lot Help from another person bathing (including washing, rinsing, drying)?: A Lot Help from another person  to  put on and taking off regular upper body clothing?: A Little Help from another person to put on and taking off regular lower body clothing?: A Lot 6 Click Score: 15   End of Session Equipment Utilized During Treatment: Gait belt;Rolling walker (2 wheels) Nurse Communication: Mobility status  Activity Tolerance: Patient tolerated treatment well Patient left: in chair;with call bell/phone within reach;with chair alarm set  OT Visit Diagnosis: Unsteadiness on feet (R26.81);Other abnormalities of gait and mobility (R26.89);Muscle weakness (generalized) (M62.81)                Time: 7989-2119 OT Time Calculation (min): 22 min Charges:  OT General Charges $OT Visit: 1 Visit OT Evaluation $OT Eval Moderate Complexity: 1 Mod   Nyzaiah Kai D Causey 09/09/2022, 3:38 PM

## 2022-09-09 NOTE — Evaluation (Signed)
Physical Therapy Evaluation Patient Details Name: Raven Peterson MRN: [id number] DOB: 17-Dec-1921 Today's Date: 09/09/2022  History of Present Illness  The pt is a 88 yo female presenting 9/28 from [place] after being found down unresponsive with L gaze preference. CT showed distal basilar occulsion. Pt given TNK. 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   Clinical Impression  Pt in bed upon arrival of PT, agreeable to evaluation at this time. Prior to admission the pt was living at [facility] where she ambulated with use of rollator, and received assist from staff for ADLs. The pt was able to complete bed mobility and initial sit-stand transfers with minA and cues for technique. She had single posterior LOB needing minA initially, but was then able to maintain balance with BUE support on RW for [place]es bout of ambulation in room. No focal weakness or change in sensation at this time, suspect the pt is close to her functional baseline and will be safe to return to [facility] with follow-up PT when medically stable for d/c.     Recommendations for follow up therapy are one component of a multi-disciplinary discharge planning process, led by the attending physician.  Recommendations may be updated based on patient status, additional functional criteria and insurance authorization.  Follow Up Recommendations Home health PT (at [facility])      Assistance Recommended at Discharge Frequent or constant Supervision/Assistance  Patient can return home with the following  A little help with walking and/or transfers;A little help with bathing/dressing/bathroom;Assistance with cooking/housework;Direct supervision/assist for medications management;Direct supervision/assist for financial management;Assist for transportation;Help with stairs or ramp for entrance    Equipment Recommendations None recommended by PT   Recommendations for Other Services       Functional Status Assessment Patient has had a recent decline in their functional status and demonstrates the ability to make significant improvements in function in a reasonable and predictable amount of time.     Precautions / Restrictions Precautions Precautions: Fall Precaution Comments: HOH Restrictions Weight Bearing Restrictions: No      Mobility  Bed Mobility Overal bed mobility: Needs Assistance Bed Mobility: Supine to Sit     Supine to sit: Min assist     General bed mobility comments: increased time, assist for trunk elevation and balance    Transfers Overall transfer level: Needs assistance Equipment used: Rolling walker (2 wheels) Transfers: Sit to/from Stand Sit to Stand: Min assist, +2 physical assistance, +2 safety/equipment           General transfer comment: minA to rise to standing with blocking of feet initially. dependent on BUE support    Ambulation/Gait Ambulation/Gait assistance: Min assist Gait Distance (Feet): 5 Feet Assistive device: Rolling walker (2 wheels) Gait Pattern/deviations: Step-through pattern, Decreased stride length, Trunk flexed Gait velocity: decreased Gait velocity interpretation: <1.31 ft/sec, indicative of household ambulator   General Gait Details: slow but steady, dependent on BUE support   Modified Rankin (Stroke Patients Only) Modified Rankin (Stroke Patients Only) Pre-Morbid Rankin Score: Slight disability Modified Rankin: Moderately severe disability     Balance Overall balance assessment: Needs assistance Sitting-balance support: Feet supported Sitting balance-Leahy Scale: Fair     Standing balance support: Bilateral upper extremity supported, During functional activity Standing balance-Leahy Scale: Poor Standing balance comment: dependent on BUE support, initial posterior lean but corrected with continued standing  Pertinent Vitals/Pain Pain Assessment Pain Assessment: Faces Faces Pain Scale: Hurts a little bit Pain Location: generalized with bed mobility Pain Descriptors / Indicators: Discomfort Pain Intervention(s): Limited activity within patient's tolerance, Monitored during session, Repositioned    Home Living Family/patient expects to be discharged to:: Assisted living                 Home Equipment: Rollator (4 wheels) Additional Comments: pt from [facility] where she gets assist and uses rollator    Prior Function Prior Level of Function : Needs assist             Mobility Comments: uses rollator ADLs Comments: reports staff assist with ADLs, does not get into shower     Hand Dominance   Dominant Hand: Right    Extremity/Trunk Assessment   Upper Extremity Assessment Upper Extremity Assessment: Defer to OT evaluation RUE Deficits / Details: overall WFL for tasks assessed. globally weak. likely baseline LUE Deficits / Details: painful over head ROM, globally weak. denies paraesthesias.    Lower Extremity Assessment Lower Extremity Assessment: Generalized weakness (grossly functional during session with pt reporting no changes in sensation other than chronically impaired sensation in RLE)    Cervical / Trunk Assessment Cervical / Trunk Assessment: Kyphotic  Communication   Communication: HOH  Cognition Arousal/Alertness: Awake/alert Behavior During Therapy: Anxious Overall Cognitive Status: No family/caregiver present to determine baseline cognitive functioning Area of Impairment: Attention, Memory, Following commands, Safety/judgement, Awareness, Problem solving, Orientation                 Orientation Level: Disoriented to, Place Current Attention Level: Selective Memory: Decreased [place]-term memory Following Commands: Follows one step commands with increased time Safety/Judgement: Decreased awareness of deficits Awareness: Emergent,  Intellectual Problem Solving: Slow processing, Decreased initiation, Requires verbal cues General Comments: stated [place] as place despite re-orientation. Pleasantly confused throughout. Followed most simple commands with increased time and cues.        General Comments General comments (skin integrity, edema, etc.): VSS on RA        Assessment/Plan    PT Assessment Patient needs continued PT services  PT Problem List Decreased strength;Decreased activity tolerance;Decreased balance;Decreased mobility;Decreased cognition;Decreased safety awareness       PT Treatment Interventions DME instruction;Gait training;Functional mobility training;Therapeutic activities;Therapeutic exercise;Balance training;Neuromuscular re-education;Patient/family education    PT Goals (Current goals can be found in the Care Plan section)  Acute Rehab PT Goals Patient Stated Goal: return to [facility] PT Goal Formulation: With patient Time For Goal Achievement: 09/23/22 Potential to Achieve Goals: Good    Frequency Min 3X/week     Co-evaluation PT/OT/SLP Co-Evaluation/Treatment: Yes Reason for Co-Treatment: Necessary to address cognition/behavior during functional activity;For patient/therapist safety;To address functional/ADL transfers PT goals addressed during session: Mobility/safety with mobility;Balance;Proper use of DME;Strengthening/ROM         AM-PAC PT "6 Clicks" Mobility  Outcome Measure Help needed turning from your back to your side while in a flat bed without using bedrails?: A Little Help needed moving from lying on your back to sitting on the side of a flat bed without using bedrails?: A Little Help needed moving to and from a bed to a chair (including a wheelchair)?: A Little Help needed standing up from a chair using your arms (e.g., wheelchair or bedside chair)?: A Little Help needed to walk in hospital room?: A Lot (walked <20 ft) Help needed climbing 3-5 steps with a  railing? : A Lot 6 Click Score: 16    End  of Session Equipment Utilized During Treatment: Gait belt Activity Tolerance: Patient tolerated treatment well Patient left: in chair;with call bell/phone within reach;with chair alarm set Nurse Communication: Mobility status PT Visit Diagnosis: Unsteadiness on feet (R26.81);Other abnormalities of gait and mobility (R26.89);Muscle weakness (generalized) (M62.81)    Time: 4193-7902 PT Time Calculation (min) (ACUTE ONLY): 22 min   Charges:   PT Evaluation $PT Eval Moderate Complexity: 1 Mod          [place] Carbo, PT, DPT   Acute Rehabilitation Department  Sandra Cockayne 09/09/2022, 4:03 PM

## 2022-09-09 NOTE — Progress Notes (Addendum)
STROKE TEAM PROGRESS NOTE   INTERVAL HISTORY Patient is seen in her room with her daughter at the bedside.  She has been hemodynamically stable and her neurological exam is stable.  She is ready to move out of the ICU.  UA ordered to investigate low grade fever.  Upon discussion with daughter, patient had one stroke in October 2015 with some aphasia which resolved and another in April 2016 which was accompanied by a seizure.  Patient had been on Eliquis since then, but this was discontinued when she was under hospice care for a period of time.  She is reportedly pretty steady on her feet and does not have frequent falls.  Will plan to restart Eliquis in the next few days.  Vitals:   09/09/22 0900 09/09/22 1000 09/09/22 1100 09/09/22 1200  BP: (!) 152/69 (!) 160/80 (!) 147/70 (!) 149/75  Pulse: 88 (!) 101 87 85  Resp: (!) 32 20 16 (!) 27  Temp:   98.3 F (36.8 C)   TempSrc:   Oral   SpO2: 92% 94% 94% 92%  Weight:      Height:       CBC:  Recent Labs  Lab 09/07/22 2238 09/09/22 0834  WBC 10.1 9.5  NEUTROABS 8.9*  --   HGB 15.8* 14.6  HCT 47.0* 42.8  MCV 96.7 95.7  PLT 212 332    Basic Metabolic Panel:  Recent Labs  Lab 09/07/22 2238 09/09/22 0834  NA 136 138  K 4.0 4.6  CL 99 103  CO2 26 25  GLUCOSE 149* 111*  BUN 21 15  CREATININE 1.09* 1.10*  CALCIUM 9.0 9.1    Lipid Panel:  Recent Labs  Lab 09/09/22 0349  CHOL 230*  TRIG 75  HDL 67  CHOLHDL 3.4  VLDL 15  LDLCALC 148*   HgbA1c:  Recent Labs  Lab 09/07/22 2238  HGBA1C 5.7*    Urine Drug Screen: No results for input(s): "LABOPIA", "COCAINSCRNUR", "LABBENZ", "AMPHETMU", "THCU", "LABBARB" in the last 168 hours.  Alcohol Level  Recent Labs  Lab 09/07/22 2238  ETH <10     IMAGING past 24 hours CT HEAD WO CONTRAST (5MM)  Result Date: 09/09/2022 CLINICAL DATA:  Stroke follow-up EXAM: CT HEAD WITHOUT CONTRAST TECHNIQUE: Contiguous axial images were obtained from the base of the skull through the  vertex without intravenous contrast. RADIATION DOSE REDUCTION: This exam was performed according to the departmental dose-optimization program which includes automated exposure control, adjustment of the mA and/or kV according to patient size and/or use of iterative reconstruction technique. COMPARISON:  Brain MRI from yesterday FINDINGS: Brain: Recent infarcts in the right occipital cortex and bilateral thalamus by MRI. Chronic small vessel ischemia in the deep cerebral white matter. Remote perforator infarct in the right striatum. No hematoma, hydrocephalus, or shift. Vascular: Prominent distal basilar density which may relate to the CTA findings. Atheromatous calcification Skull: None Sinuses/Orbits: No acute finding. IMPRESSION: Recent posterior circulation infarcts without evidence of progression. No shift or hematoma. Electronically Signed   By: Jorje Guild M.D.   On: 09/09/2022 07:59   MR BRAIN WO CONTRAST  Result Date: 09/08/2022 CLINICAL DATA:  Stroke follow-up EXAM: MRI HEAD WITHOUT CONTRAST TECHNIQUE: Multiplanar, multiecho pulse sequences of the brain and surrounding structures were obtained without intravenous contrast. COMPARISON:  No prior MRI, correlation is made with CT head and CTA head and neck 09/07/2022 FINDINGS: Evaluation is somewhat limited by motion artifact. Brain: Restricted diffusion with ADC correlate in the medial right occipital  lobe and posterior hippocampus (series 5, images 64-70), with an additional focus of restricted diffusion in the anteromedial right thalamus (series 5, image 72), consistent with acute infarcts. The right occipital infarct is associated with gyriform hemosiderin deposition, likely hemorrhage. Surrounding T2 hyperintense signal, likely edema, with mild local mass effect. No mass, midline shift, hydrocephalus or extra-axial collection. Additional foci of hemosiderin deposition in the right thalamus and right temporal lobe, likely sequela more remote  microhemorrhages. Confluent T2 hyperintense signal in the periventricular white matter, likely the sequela of moderate chronic small vessel ischemic disease. Vascular: Normal arterial flow voids. Skull and upper cervical spine: Normal marrow signal. Sinuses/Orbits: No acute finding. Status post bilateral lens replacements. Other: The mastoids are well aerated. IMPRESSION: Acute infarcts in the medial right occipital lobe and posterior hippocampus, with an additional focus of acute infarct in the anteromedial right thalamus. The right occipital infarct is associated with gyriform hemosiderin deposition, likely hemorrhage, with mild local mass effect but no midline shift. These results will be called to the ordering clinician or representative by the Radiologist Assistant, and communication documented in the PACS or [employer]. Electronically Signed   By: Merilyn Baba M.D.   On: 09/08/2022 22:17    PHYSICAL EXAM General:  Alert, well-developed, well-nourished elderly Caucasian lady in no acute distress Respiratory:  Regular, unlabored respirations on room air CV: atrial fibrillation on monitor  NEURO:  Mental Status: AA&Ox2 Speech/Language: speech is without dysarthria or aphasia.  Fluency, and comprehension intact but hard of hearing  Cranial Nerves:  II: PERRL. Left upper quadrant visual field cut III, IV, VI: EOMI. Eyelids elevate symmetrically.  V: Sensation is intact to light touch and symmetrical to face.  VII: Smile is symmetrical.   VIII: hearing intact to voice. IX, X: Phonation is normal.  XII: tongue is midline without fasciculations. Motor: 5/5 strength to all muscle groups tested.  Tone: is normal and bulk is normal Sensation- Intact to light touch bilaterally. Extinction absent to light touch to DSS.  Coordination: FTN intact bilaterally  Gait- deferred   ASSESSMENT/PLAN [name] is a 88 y.o. female with history of atrial fibrillation not on anticoagulation and  stroke presenting after she was found to be unsteady, requiring assistance to get into bed, and then quickly developed unresponsiveness with left gaze deviation.  She was found to have a distal basilar occlusion on CTA and was given TNK with improvement in her symptoms.  She reportedly would not have wished to have aggressive interventions such as thrombectomy.  Will plan to restart Eliquis for atrial fibrillation in the next few days.  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 ASPECTS 10.    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 Will start [place] in 3-5 days if neuro stable Therapy recommendations:  HH Disposition:  pending  Afib Patient has a history of a-fib Not on anticoagulation at home as  it was discontinued when she was on hospice care Grandson denies frequent falls On aspirin now We will consider DOAC in 3 to 5 days if neuro stable  Hypertension Home meds:  none Stable Keep BP <160 given HT Long-term BP goal normotensive  Hyperlipidemia Home meds:  none LDL 148, goal < 70 Add rosuvastatin 20 mg daily  Continue statin at discharge  Fever  UTI Overnight low-grade fever Tmax 100.6 UA WBC 21-50 Put on Rocephin  Other Stroke Risk Factors Advanced Age >/= 68  Hx stroke - details not clear  Other Active Problems Presumed seizure history, on Keppra 500 PTA, continue [facility] day #  [place] , MSN, AGACNP-BC [facility] See Amion for schedule and pager information 09/09/2022 12:32 PM   ATTENDING NOTE: I reviewed above note and agree with the assessment and plan. Pt was seen and examined.   88 year old female with history of A-fib not on AC, stroke admitted for unsteady gait then became unresponsive and left gaze.  CT no acute abnormality.  Status post TNK.  CT head and neck showed distal basilar artery, right PCA occlusion, bilateral M2 and A2 severe stenosis, right VA origin stenosis.  EF 60 to 65%, LDL 148, A1c 5.7, MRI showed right PCA infarct with petechial hemorrhagic transformation.  CT 9/30 showed right PCA infarct with petechial HT but no frank hematoma.  Creatinine 1.09, Tmax 100.6, UA WBC 21-50.  On exam, grandson is at bedside. Pt awake, alert, eyes open, orientated to place, but not to time. No aphasia but paucity of speech, following all simple commands. Able to name and repeat. No gaze palsy, tracking bilaterally, visual field testing showed left upper quadrantanopia. No significant facial droop. Tongue midline. Bilateral UEs 3/5, no drift. Bilaterally LEs at least 3-/5, symmetrical. Sensation symmetrical bilaterally, b/l FTN intact grossly, gait not tested.   Etiology for patient stroke likely due to A-fib not on [facility].  Apparently patient was discontinued on [facility] when she was in hospice care.  Now on aspirin 81, will consider DOAC in 3 to 5 days due to HT.  Put on Crestor 20, continue home Keppra.  Low-grade fever and UA suggesting mild UTI, put on Rocephin.  PT/OT recommend home health.  For detailed assessment and plan, please refer to above/below as I have made changes wherever appropriate.   Rosalin Hawking, MD PhD Stroke Neurology 09/09/2022 4:56 PM  This patient is critically ill due to stroke, A-fib not on AC, mild hemorrhagic transformation, fever and at significant risk of neurological worsening, death form recurrent stroke,  hemorrhagic transformation, heart failure, sepsis. This patient's care requires constant monitoring of vital signs, hemodynamics, respiratory and cardiac monitoring, review of multiple databases, neurological assessment, discussion with family, other specialists and medical decision making of high complexity. I spent 40 minutes of neurocritical care time in the care of this patient. I had long discussion with grandson at bedside, updated pt current condition, treatment plan and potential prognosis, and answered all the questions.  He expressed understanding and appreciation.       To contact Stroke Continuity provider, please refer to [email or web address] After hours, contact General Neurology

## 2022-09-10 DIAGNOSIS — I639 Cerebral infarction, unspecified: Secondary | ICD-10-CM | POA: Diagnosis not present

## 2022-09-10 LAB — BASIC METABOLIC PANEL
Anion gap: 13 (ref 5–15)
BUN: 17 mg/dL (ref 8–23)
CO2: 24 mmol/L (ref 22–32)
Calcium: 8.6 mg/dL — ABNORMAL LOW (ref 8.9–10.3)
Chloride: 103 mmol/L (ref 98–111)
Creatinine, Ser: 1.13 mg/dL — ABNORMAL HIGH (ref 0.44–1.00)
GFR, Estimated: 44 mL/min — ABNORMAL LOW (ref >60)
Glucose, Bld: 118 mg/dL — ABNORMAL HIGH (ref 70–99)
Potassium: 4 mmol/L (ref 3.5–5.1)
Sodium: 140 mmol/L (ref 135–145)

## 2022-09-10 LAB — CBC
HCT: 40.5 % (ref 36.0–46.0)
Hemoglobin: 13.8 g/dL (ref 12.0–15.0)
MCH: 32.8 pg (ref 26.0–34.0)
MCHC: 34.1 g/dL (ref 30.0–36.0)
MCV: 96.2 fL (ref 80.0–100.0)
Platelets: 216 10*3/uL (ref 150–400)
RBC: 4.21 MIL/uL (ref 3.87–5.11)
RDW: 12.9 % (ref 11.5–15.5)
WBC: 8.2 10*3/uL (ref 4.0–10.5)
nRBC: 0 % (ref 0.0–0.2)

## 2022-09-10 NOTE — Plan of Care (Signed)
  Problem: Education: Goal: Knowledge of General Education information will improve Description: Including pain rating scale, medication(s)/side effects and non-pharmacologic comfort measures Outcome: Progressing   Problem: Health Behavior/Discharge Planning: Goal: Ability to manage health-related needs will improve Outcome: Progressing   Problem: Clinical Measurements: Goal: Ability to maintain clinical measurements within normal limits will improve Outcome: Progressing Goal: Will remain free from infection Outcome: Progressing Goal: Diagnostic test results will improve Outcome: Progressing Goal: Respiratory complications will improve Outcome: Progressing Goal: Cardiovascular complication will be avoided Outcome: Progressing   Problem: Activity: Goal: Risk for activity intolerance will decrease Outcome: Progressing   Problem: Nutrition: Goal: Adequate nutrition will be maintained Outcome: Progressing   Problem: Coping: Goal: Level of anxiety will decrease Outcome: Progressing   Problem: Elimination: Goal: Will not experience complications related to bowel motility Outcome: Progressing Goal: Will not experience complications related to urinary retention Outcome: Progressing   Problem: Pain Managment: Goal: General experience of comfort will improve Outcome: Progressing   Problem: Safety: Goal: Ability to remain free from injury will improve Outcome: Progressing   Problem: Skin Integrity: Goal: Risk for impaired skin integrity will decrease Outcome: Progressing   Problem: Education: Goal: Knowledge of disease or condition will improve Outcome: Progressing Goal: Knowledge of secondary prevention will improve (SELECT ALL) Outcome: Progressing Goal: Knowledge of patient specific risk factors will improve (INDIVIDUALIZE FOR PATIENT) Outcome: Progressing Goal: Individualized Educational Video(s) Outcome: Progressing   Problem: Education: Goal: Knowledge of  disease or condition will improve Outcome: Progressing Goal: Knowledge of secondary prevention will improve (SELECT ALL) Outcome: Progressing   

## 2022-09-10 NOTE — Progress Notes (Signed)
STROKE TEAM PROGRESS NOTE   INTERVAL HISTORY Daughter and grandson are at bedside.  Patient sitting in chair, awake alert, neuro stable.  PT/OT recommend home health in [place].  We will check with social worker in a.m. for placement.  Vitals:   09/10/22 0400 09/10/22 0740 09/10/22 1600 09/10/22 1930  BP: 133/63 128/74 128/74 (!) 149/67  Pulse:  76 85 90  Resp: 20 18 19 18   Temp: 97.8 F (36.6 C) 98 F (36.7 C) 98 F (36.7 C) 99.1 F (37.3 C)  TempSrc: Oral Oral Oral Axillary  SpO2:  94% 97% 98%  Weight:      Height:       CBC:  Recent Labs  Lab 09/07/22 2238 09/09/22 0834 09/10/22 0820  WBC 10.1 9.5 8.2  NEUTROABS 8.9*  --   --   HGB 15.8* 14.6 13.8  HCT 47.0* 42.8 40.5  MCV 96.7 95.7 96.2  PLT 212 230 [id number]   Basic Metabolic Panel:  Recent Labs  Lab 09/09/22 0834 09/10/22 0820  NA 138 140  K 4.6 4.0  CL 103 103  CO2 25 24  GLUCOSE 111* 118*  BUN 15 17  CREATININE 1.10* 1.13*  CALCIUM 9.1 8.6*   Lipid Panel:  Recent Labs  Lab 09/09/22 0349  CHOL 230*  TRIG 75  HDL 67  CHOLHDL 3.4  VLDL 15  LDLCALC 148*   HgbA1c:  Recent Labs  Lab 09/07/22 2238  HGBA1C 5.7*   Urine Drug Screen: No results for input(s): "LABOPIA", "COCAINSCRNUR", "LABBENZ", "AMPHETMU", "THCU", "LABBARB" in the last 168 hours.  Alcohol Level  Recent Labs  Lab 09/07/22 2238  ETH <10    IMAGING past 24 hours No results found.  PHYSICAL EXAM General:  Alert, well-developed, well-nourished elderly Caucasian lady in no acute distress Respiratory:  Regular, unlabored respirations on room air CV: atrial fibrillation on monitor  NEURO:  awake, alert, eyes open, orientated to place, but not to time. No aphasia but paucity of speech, following all simple commands. Able to name and repeat. No gaze palsy, tracking bilaterally, visual field testing showed left upper quadrantanopia. No significant facial droop. Tongue midline. Bilateral UEs 3/5, no drift. Bilaterally LEs at least 3-/5,  symmetrical. Sensation symmetrical bilaterally, b/l FTN intact grossly, gait not tested.    ASSESSMENT/PLAN Ms. PALYN WEDEKIND is a 88 y.o. female with history of atrial fibrillation not on anticoagulation and stroke presenting after she was found to be unsteady, requiring assistance to get into bed, and then quickly developed unresponsiveness with left gaze deviation.  She was found to have a distal basilar occlusion on CTA and was given TNK with improvement in her symptoms.  She reportedly would not have wished to have aggressive interventions such as thrombectomy.  Will plan to restart Eliquis for atrial fibrillation in the next few days.  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 ASPECTS 10.    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 Will start [place] in 2-3 days if neuro stable Therapy recommendations:  HH Disposition:  pending  Afib Patient has a history of a-fib Not on anticoagulation at home as it was discontinued when she was on hospice care Grandson denies frequent falls On aspirin now We will consider DOAC in 2-3 days if neuro stable  Hypertension Home meds:  none Stable Keep BP <160 given HT Long-term BP goal normotensive  Hyperlipidemia Home meds:  none LDL 148, goal < 70 Add rosuvastatin 20 mg daily  Continue statin at discharge  Fever  UTI Overnight low-grade fever Tmax 100.6-> afebrile UA  WBC 21-50 On Rocephin  Other Stroke Risk Factors Advanced Age >/= 53  Hx stroke - details not clear  Other Active Problems Presumed seizure history, on Keppra 500 PTA, continue [facility] day # 3  Rosalin Hawking, MD PhD Stroke Neurology 09/10/2022 11:52 PM    To contact Stroke Continuity provider, please refer to [email or web address] After hours, contact General Neurology

## 2022-09-11 ENCOUNTER — Other Ambulatory Visit (HOSPITAL_COMMUNITY): Payer: Self-pay

## 2022-09-11 DIAGNOSIS — N390 Urinary tract infection, site not specified: Secondary | ICD-10-CM | POA: Diagnosis present

## 2022-09-11 DIAGNOSIS — I63531 Cerebral infarction due to unspecified occlusion or stenosis of right posterior cerebral artery: Secondary | ICD-10-CM

## 2022-09-11 DIAGNOSIS — E785 Hyperlipidemia, unspecified: Secondary | ICD-10-CM

## 2022-09-11 DIAGNOSIS — F32A Depression, unspecified: Secondary | ICD-10-CM

## 2022-09-11 DIAGNOSIS — G40909 Epilepsy, unspecified, not intractable, without status epilepticus: Secondary | ICD-10-CM

## 2022-09-11 DIAGNOSIS — I4821 Permanent atrial fibrillation: Secondary | ICD-10-CM

## 2022-09-11 DIAGNOSIS — I10 Essential (primary) hypertension: Secondary | ICD-10-CM

## 2022-09-11 DIAGNOSIS — I4891 Unspecified atrial fibrillation: Secondary | ICD-10-CM | POA: Diagnosis present

## 2022-09-11 LAB — BASIC METABOLIC PANEL
Anion gap: 12 (ref 5–15)
BUN: 17 mg/dL (ref 8–23)
CO2: 23 mmol/L (ref 22–32)
Calcium: 8.7 mg/dL — ABNORMAL LOW (ref 8.9–10.3)
Chloride: 104 mmol/L (ref 98–111)
Creatinine, Ser: 1.06 mg/dL — ABNORMAL HIGH (ref 0.44–1.00)
GFR, Estimated: 48 mL/min — ABNORMAL LOW (ref >60)
Glucose, Bld: 116 mg/dL — ABNORMAL HIGH (ref 70–99)
Potassium: 3.9 mmol/L (ref 3.5–5.1)
Sodium: 139 mmol/L (ref 135–145)

## 2022-09-11 LAB — CBC
HCT: 40.8 % (ref 36.0–46.0)
Hemoglobin: 13.8 g/dL (ref 12.0–15.0)
MCH: 32.5 pg (ref 26.0–34.0)
MCHC: 33.8 g/dL (ref 30.0–36.0)
MCV: 96 fL (ref 80.0–100.0)
Platelets: 200 10*3/uL (ref 150–400)
RBC: 4.25 MIL/uL (ref 3.87–5.11)
RDW: 12.8 % (ref 11.5–15.5)
WBC: 8.1 10*3/uL (ref 4.0–10.5)
nRBC: 0 % (ref 0.0–0.2)

## 2022-09-11 MED ORDER — ROSUVASTATIN CALCIUM 20 MG PO TABS
20.0000 mg | ORAL_TABLET | Freq: Every day | ORAL | 0 refills | Status: AC
  Filled 2022-09-11: qty 30, 30d supply, fill #0

## 2022-09-11 MED ORDER — ASPIRIN 81 MG PO TBEC
81.0000 mg | DELAYED_RELEASE_TABLET | Freq: Every day | ORAL | 12 refills | Status: DC
  Filled 2022-09-11: qty 30, 30d supply, fill #0

## 2022-09-11 NOTE — Discharge Summary (Addendum)
Stroke Discharge Summary  Patient ID: Raven Peterson   MRN: [id number]      DOB: 1922/01/15  Date of Admission: 09/07/2022 Date of Discharge: 09/11/2022  Attending Physician:  Stroke, Md, MD, Stroke MD Patient's PCP:  Patient, No Pcp Per  DISCHARGE DIAGNOSIS:  Principal Problem:   Stroke: R PCA infarct with HT due to distal BA, right PCA occlusion s/p TNK, likely due to afib not on [facility]  Active Problems:   Essential hypertension   GERD (gastroesophageal reflux disease)   Seizure disorder ([place])   Hypothyroidism   Hyperlipidemia   Atrial fibrillation ([place])   Depression   UTI (urinary tract infection)   Allergies as of 09/11/2022       Reactions   Cimetidine Palpitations, Other (See Comments)   "Allergic," per MAR   Naproxen Sodium Other (See Comments)   Affects glaucoma medication   Fluzone [influenza Virus Vaccine] Other (See Comments)   "Allergic," per MAR   Fosamax [alendronate Sodium] Other (See Comments)   "Allergic," per MAR   Loratadine Other (See Comments)   "Allergic," per MAR   Iodinated Contrast Media Nausea Only        Medication List     STOP taking these medications    BAZA PROTECT EX   benzonatate 100 MG capsule Commonly known as: TESSALON   methocarbamol 500 MG tablet Commonly known as: ROBAXIN   ondansetron 4 MG tablet Commonly known as: ZOFRAN       TAKE these medications    aspirin EC 81 MG tablet Take 1 tablet (81 mg total) by mouth daily. Swallow whole. Start taking on: September 12, 2022   Ensure Take 237 mLs by mouth 3 (three) times daily between meals.   furosemide 40 MG tablet Commonly known as: Lasix Take 1 tablet (40 mg total) by mouth daily.   gabapentin 100 MG capsule Commonly known as: NEURONTIN Take 100 mg by mouth at bedtime.   HYDROcodone-acetaminophen 5-325 MG tablet Commonly known as: NORCO/VICODIN Take 1 tablet by mouth 3 (three) times daily as needed for moderate pain. What changed: when to take this    levETIRAcetam 500 MG tablet Commonly known as: KEPPRA Take 500 mg by mouth 2 (two) times daily.   levothyroxine 50 MCG tablet Commonly known as: SYNTHROID Take 50 mcg by mouth daily before breakfast.   LORazepam 0.5 MG tablet Commonly known as: ATIVAN Take 0.25 mg by mouth See admin instructions. 0.25mg  oral in the evening And 0.25mg  oral every 12 hours as needed for breakthrough anxiety   omeprazole 20 MG capsule Commonly known as: PRILOSEC Take 20 mg by mouth at bedtime.   polyethylene glycol 17 g packet Commonly known as: MIRALAX / GLYCOLAX Take 17 g by mouth daily.   rosuvastatin 20 MG tablet Commonly known as: CRESTOR Take 1 tablet (20 mg total) by mouth daily. Start taking on: September 12, 2022   Senna Plus 8.6-50 MG Caps Generic drug: Sennosides-Docusate Sodium Take 1 capsule by mouth at bedtime.   sertraline 50 MG tablet Commonly known as: ZOLOFT Take 50 mg by mouth daily.        LABORATORY STUDIES CBC    Component Value Date/Time   WBC 8.1 09/11/2022 0258   RBC 4.25 09/11/2022 0258   HGB 13.8 09/11/2022 0258   HCT 40.8 09/11/2022 0258   PLT 200 09/11/2022 0258   MCV 96.0 09/11/2022 0258   MCH 32.5 09/11/2022 0258   MCHC 33.8 09/11/2022 0258   RDW  12.8 09/11/2022 0258   LYMPHSABS 0.7 09/07/2022 2238   MONOABS 0.4 09/07/2022 2238   EOSABS 0.0 09/07/2022 2238   BASOSABS 0.0 09/07/2022 2238   CMP    Component Value Date/Time   NA 139 09/11/2022 0258   K 3.9 09/11/2022 0258   CL 104 09/11/2022 0258   CO2 23 09/11/2022 0258   GLUCOSE 116 (H) 09/11/2022 0258   BUN 17 09/11/2022 0258   CREATININE 1.06 (H) 09/11/2022 0258   CALCIUM 8.7 (L) 09/11/2022 0258   PROT 6.7 09/07/2022 2238   ALBUMIN 3.8 09/07/2022 2238   AST 19 09/07/2022 2238   ALT 9 09/07/2022 2238   ALKPHOS 62 09/07/2022 2238   BILITOT 0.7 09/07/2022 2238   GFRNONAA 48 (L) 09/11/2022 0258   GFRAA >60 06/03/2019 0522   COAGS Lab Results  Component Value Date   INR 1.0  09/07/2022   Lipid Panel    Component Value Date/Time   CHOL 230 (H) 09/09/2022 0349   TRIG 75 09/09/2022 0349   HDL 67 09/09/2022 0349   CHOLHDL 3.4 09/09/2022 0349   VLDL 15 09/09/2022 0349   LDLCALC 148 (H) 09/09/2022 0349   HgbA1C  Lab Results  Component Value Date   HGBA1C 5.7 (H) 09/07/2022   Urinalysis    Component Value Date/Time   COLORURINE YELLOW 09/09/2022 1230   APPEARANCEUR HAZY (A) 09/09/2022 1230   LABSPEC 1.019 09/09/2022 1230   PHURINE 6.0 09/09/2022 1230   GLUCOSEU NEGATIVE 09/09/2022 1230   HGBUR NEGATIVE 09/09/2022 1230   BILIRUBINUR NEGATIVE 09/09/2022 1230   KETONESUR NEGATIVE 09/09/2022 1230   PROTEINUR NEGATIVE 09/09/2022 1230   NITRITE NEGATIVE 09/09/2022 1230   LEUKOCYTESUR MODERATE (A) 09/09/2022 1230   Urine Drug Screen No results found for: "LABOPIA", "COCAINSCRNUR", "LABBENZ", "AMPHETMU", "THCU", "LABBARB"  Alcohol Level    Component Value Date/Time   ETH <10 09/07/2022 2238     SIGNIFICANT DIAGNOSTIC STUDIES CT HEAD WO CONTRAST (5MM)  Result Date: 09/09/2022 CLINICAL DATA:  Stroke follow-up EXAM: CT HEAD WITHOUT CONTRAST TECHNIQUE: Contiguous axial images were obtained from the base of the skull through the vertex without intravenous contrast. RADIATION DOSE REDUCTION: This exam was performed according to the departmental dose-optimization program which includes automated exposure control, adjustment of the mA and/or kV according to patient size and/or use of iterative reconstruction technique. COMPARISON:  Brain MRI from yesterday FINDINGS: Brain: Recent infarcts in the right occipital cortex and bilateral thalamus by MRI. Chronic small vessel ischemia in the deep cerebral white matter. Remote perforator infarct in the right striatum. No hematoma, hydrocephalus, or shift. Vascular: Prominent distal basilar density which may relate to the CTA findings. Atheromatous calcification Skull: None Sinuses/Orbits: No acute finding. IMPRESSION: Recent  posterior circulation infarcts without evidence of progression. No shift or hematoma. Electronically Signed   By: Jorje Guild M.D.   On: 09/09/2022 07:59   MR BRAIN WO CONTRAST  Result Date: 09/08/2022 CLINICAL DATA:  Stroke follow-up EXAM: MRI HEAD WITHOUT CONTRAST TECHNIQUE: Multiplanar, multiecho pulse sequences of the brain and surrounding structures were obtained without intravenous contrast. COMPARISON:  No prior MRI, correlation is made with CT head and CTA head and neck 09/07/2022 FINDINGS: Evaluation is somewhat limited by motion artifact. Brain: Restricted diffusion with ADC correlate in the medial right occipital lobe and posterior hippocampus (series 5, images 64-70), with an additional focus of restricted diffusion in the anteromedial right thalamus (series 5, image 72), consistent with acute infarcts. The right occipital infarct is associated with gyriform hemosiderin deposition,  likely hemorrhage. Surrounding T2 hyperintense signal, likely edema, with mild local mass effect. No mass, midline shift, hydrocephalus or extra-axial collection. Additional foci of hemosiderin deposition in the right thalamus and right temporal lobe, likely sequela more remote microhemorrhages. Confluent T2 hyperintense signal in the periventricular white matter, likely the sequela of moderate chronic small vessel ischemic disease. Vascular: Normal arterial flow voids. Skull and upper cervical spine: Normal marrow signal. Sinuses/Orbits: No acute finding. Status post bilateral lens replacements. Other: The mastoids are well aerated. IMPRESSION: Acute infarcts in the medial right occipital lobe and posterior hippocampus, with an additional focus of acute infarct in the anteromedial right thalamus. The right occipital infarct is associated with gyriform hemosiderin deposition, likely hemorrhage, with mild local mass effect but no midline shift. These results will be called to the ordering clinician or representative by  the Radiologist Assistant, and communication documented in the PACS or [employer]. Electronically Signed   By: Merilyn Baba M.D.   On: 09/08/2022 22:17   ECHOCARDIOGRAM COMPLETE  Result Date: 09/08/2022    ECHOCARDIOGRAM REPORT   Patient Name:   Raven Peterson Date of Exam: 09/08/2022 Medical Rec #:  [id number]    Height:       64.0 in Accession #:    [id number]   Weight:       153.0 lb Date of Birth:  24-Jan-1922    BSA:          1.746 m Patient Age:    84 years     BP:           164/73 mmHg Patient Gender: F            HR:           93 bpm. Exam Location:  Inpatient Procedure: 2D Echo, Cardiac Doppler and Color Doppler Indications:    Stroke 434.91 / I163.9  History:        Patient has prior history of Echocardiogram examinations, most                 recent 05/31/2019. Stroke, Signs/Symptoms:Dyspnea; Risk                 Factors:Hypertension and Dyslipidemia.  Sonographer:    Ronny Flurry Sonographer#2:  Melissa Morford RDCS (AE, PE) Referring Phys: [phone] MCNEILL P KIRKPATRICK  Sonographer Comments: Technically difficult study due to poor echo windows, suboptimal parasternal window and suboptimal apical window. IMPRESSIONS  1. Left ventricular ejection fraction, by estimation, is 60 to 65%. The left ventricle has normal function. The left ventricle has no regional wall motion abnormalities. There is severe left ventricular hypertrophy. Left ventricular diastolic parameters  are indeterminate.  2. Right ventricular systolic function is normal. The right ventricular size is normal.  3. The mitral valve is normal in structure. Mild mitral valve regurgitation. No evidence of mitral stenosis. Severe mitral annular calcification.  4. The aortic valve has an indeterminant number of cusps. Aortic valve regurgitation is not visualized. Mild aortic valve stenosis.  5. The inferior vena cava is normal in size with greater than 50% respiratory variability, suggesting right atrial pressure of 3 mmHg. FINDINGS  Left  Ventricle: Left ventricular ejection fraction, by estimation, is 60 to 65%. The left ventricle has normal function. The left ventricle has no regional wall motion abnormalities. The left ventricular internal cavity size was normal in size. There is  severe left ventricular hypertrophy. Left ventricular diastolic parameters are indeterminate. Right Ventricle: The right ventricular size is normal. Right ventricular  systolic function is normal. Left Atrium: Left atrial size was normal in size. Right Atrium: Right atrial size was normal in size. Pericardium: There is no evidence of pericardial effusion. Mitral Valve: The mitral valve is normal in structure. Severe mitral annular calcification. Mild mitral valve regurgitation. No evidence of mitral valve stenosis. MV peak gradient, 6.6 mmHg. The mean mitral valve gradient is 4.0 mmHg. Tricuspid Valve: The tricuspid valve is normal in structure. Tricuspid valve regurgitation is trivial. No evidence of tricuspid stenosis. Aortic Valve: The aortic valve has an indeterminant number of cusps. Aortic valve regurgitation is not visualized. Mild aortic stenosis is present. Aortic valve mean gradient measures 13.5 mmHg. Aortic valve peak gradient measures 20.8 mmHg. Aortic valve  area, by VTI measures 0.72 cm. Pulmonic Valve: The pulmonic valve was not well visualized. Pulmonic valve regurgitation is trivial. No evidence of pulmonic stenosis. Aorta: The aortic root is normal in size and structure. Venous: The inferior vena cava is normal in size with greater than 50% respiratory variability, suggesting right atrial pressure of 3 mmHg. IAS/Shunts: No atrial level shunt detected by color flow Doppler.  LEFT VENTRICLE PLAX 2D LVIDd:         3.80 cm   Diastology LVIDs:         3.50 cm   LV e' medial:    2.70 cm/s LV PW:         1.10 cm   LV E/e' medial:  56.9 LV IVS:        1.60 cm   LV e' lateral:   4.35 cm/s LVOT diam:     1.80 cm   LV E/e' lateral: 35.3 LV SV:         36 LV SV  Index:   21 LVOT Area:     2.54 cm  RIGHT VENTRICLE RV S prime:     7.35 cm/s TAPSE (M-mode): 1.4 cm LEFT ATRIUM             Index        RIGHT ATRIUM           Index LA diam:        3.80 cm 2.18 cm/m   RA Area:     11.75 cm LA Vol (A2C):   39.5 ml 22.63 ml/m  RA Volume:   23.45 ml  13.43 ml/m LA Vol (A4C):   67.0 ml 38.38 ml/m LA Biplane Vol: 53.7 ml 30.76 ml/m  AORTIC VALVE AV Area (Vmax):    0.81 cm AV Area (Vmean):   0.76 cm AV Area (VTI):     0.72 cm AV Vmax:           228.00 cm/s AV Vmean:          175.000 cm/s AV VTI:            0.494 m AV Peak Grad:      20.8 mmHg AV Mean Grad:      13.5 mmHg LVOT Vmax:         72.47 cm/s LVOT Vmean:        52.533 cm/s LVOT VTI:          0.141 m LVOT/AV VTI ratio: 0.28  AORTA Ao Root diam: 2.70 cm Ao Asc diam:  3.00 cm MITRAL VALVE MV Area (PHT): 3.97 cm     SHUNTS MV Area VTI:   1.31 cm     Systemic VTI:  0.14 m MV Peak grad:  6.6 mmHg     Systemic  Diam: 1.80 cm MV Mean grad:  4.0 mmHg MV Vmax:       1.28 m/s MV Vmean:      94.1 cm/s MV Decel Time: 191 msec MV E velocity: 153.50 cm/s MV A velocity: 112.00 cm/s MV E/A ratio:  1.37 Kirk Ruths MD Electronically signed by Kirk Ruths MD Signature Date/Time: 09/08/2022/12:42:29 PM    Final    DG Chest Portable 1 View  Result Date: 09/07/2022 CLINICAL DATA:  Possible aspiration EXAM: PORTABLE CHEST 1 VIEW COMPARISON:  01/13/2021 FINDINGS: Heart is normal size. Diffuse interstitial prominence throughout the lungs, similar prior study. Favor chronic interstitial lung disease. No effusions or acute bony abnormality. Aortic atherosclerosis. IMPRESSION: Diffuse interstitial prominence throughout the lungs, favor chronic interstitial lung disease. Electronically Signed   By: Rolm Baptise M.D.   On: 09/07/2022 22:09   CT ANGIO HEAD NECK W WO CM (CODE STROKE)  Result Date: 09/07/2022 CLINICAL DATA:  Neuro deficit, acute, stroke suspected. EXAM: CT ANGIOGRAPHY HEAD AND NECK TECHNIQUE: Multidetector CT imaging of  the head and neck was performed using the standard protocol during bolus administration of intravenous contrast. Multiplanar CT image reconstructions and MIPs were obtained to evaluate the vascular anatomy. Carotid stenosis measurements (when applicable) are obtained utilizing NASCET criteria, using the distal internal carotid diameter as the denominator. RADIATION DOSE REDUCTION: This exam was performed according to the departmental dose-optimization program which includes automated exposure control, adjustment of the mA and/or kV according to patient size and/or use of iterative reconstruction technique. CONTRAST:  92mL OMNIPAQUE IOHEXOL 350 MG/ML SOLN COMPARISON:  None Available. FINDINGS: CTA NECK FINDINGS Aortic arch: Standard 3 vessel aortic arch with moderately extensive calcified and soft plaque. No flow limiting stenosis of the arch vessel origins. Right carotid system: Patent with a moderate amount of calcified plaque at the carotid bifurcation resulting in severe stenosis of the ECA origin. No significant common or internal carotid artery stenosis. Left carotid system: Patent with a small amount of calcified plaque at the carotid bifurcation. No evidence of a significant stenosis or dissection. Vertebral arteries: Patent with the left being strongly dominant. Mild atherosclerotic irregularity of the left vertebral artery without significant stenosis. Severe stenosis of the right vertebral origin. Mild right V3 segment stenosis. Skeleton: Moderate lower cervical disc degeneration. Moderately advanced mid upper cervical facet arthrosis with bilateral facet ankylosis at C2-3. Other neck: No evidence of cervical lymphadenopathy or mass. Upper chest: Mosaic attenuation, mild interlobular septal thickening, and bronchial wall thickening in the included upper lobes. Review of the MIP images confirms the above findings CTA HEAD FINDINGS Anterior circulation: The internal carotid arteries are patent from skull  base to carotid termini with calcified plaque resulting in mild cavernous and moderate paraclinoid stenosis bilaterally. ACAs and MCAs are patent with relatively widespread atherosclerosis involving the branch vessels including severe bilateral M2 and A2 stenoses. There are also severe right A1 and mild left M1 stenoses. No aneurysm is identified. Posterior circulation: The intracranial left vertebral artery is patent and supplies the basilar. The right vertebral artery ends in PICA. The basilar artery is patent proximally, however there is a filling defect in the distal basilar artery consistent with thrombus which extends into the proximal right P1 segment. There is reconstitution of the right P1 segment more distally, however there is additional occlusion of the right PCA at the P1-P2 junction with distal reconstitution of the distal P2 segment and P3 branches. The left PCA is patent, however its origin is severely narrowed by the distal  basilar thrombus, and there are also severe left P2 and P3 stenoses. No aneurysm is identified. Venous sinuses: As permitted by contrast timing, patent. Anatomic variants: None. Review of the MIP images confirms the above findings These results were communicated to Dr. Leonel Ramsay at 9:15 pm on 09/07/2022 by text page via the [facility] messaging system. IMPRESSION: 1. 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 2. Additional occlusion of the right PCA at the P1-P2 junction with distal reconstitution. 3. Intracranial atherosclerosis including severe bilateral M2 and A2 stenoses and mild-to-moderate ICA stenoses. 4. Severe stenosis of the origin of the nondominant right vertebral artery. 5.  Aortic Atherosclerosis (ICD10-I70.0). Electronically Signed   By: Logan Bores M.D.   On: 09/07/2022 21:34   CT HEAD CODE STROKE WO CONTRAST  Result Date: 09/07/2022 CLINICAL DATA:  Code stroke. EXAM: CT HEAD WITHOUT  CONTRAST TECHNIQUE: Contiguous axial images were obtained from the base of the skull through the vertex without intravenous contrast. RADIATION DOSE REDUCTION: This exam was performed according to the departmental dose-optimization program which includes automated exposure control, adjustment of the mA and/or kV according to patient size and/or use of iterative reconstruction technique. COMPARISON:  09/27/2021 FINDINGS: Brain: No evidence of acute infarction, hemorrhage, cerebral edema, mass, mass effect, or midline shift. No hydrocephalus or extra-axial collection. Periventricular white matter changes, likely the sequela of chronic small vessel ischemic disease. Remote lacunar infarct in the right lentiform nucleus. Vascular: No hyperdense vessel. Atherosclerotic calcifications in the intracranial carotid and vertebral arteries. Skull: Negative for fracture or focal lesion. Sinuses/Orbits: No acute finding. Other: The mastoid air cells are well aerated. ASPECTS [facility] Stroke Program Early CT Score) - Ganglionic level infarction (caudate, lentiform nuclei, internal capsule, insula, M1-M3 cortex): 7 - Supraganglionic infarction (M4-M6 cortex): 3 Total score (0-10 with 10 being normal): 10 IMPRESSION: 1. No acute intracranial process. 2. ASPECTS is 10 Code stroke imaging results were communicated on 09/07/2022 at 8:47 pm to provider Dr. Leonel Ramsay via secure text paging. Electronically Signed   By: Merilyn Baba M.D.   On: 09/07/2022 20:48      HISTORY OF PRESENT ILLNESS Raven Peterson is a 88 y.o. female with a history of stroke and atrial fibrillation who is not on anticoagulation due to falls.  She was in her normal state of health earlier, and went to dinner at 5 PM.  She was normal at that time.  Subsequently she was discovered at 7 PM to be unsteady, and had to be assisted to get back in bed which is very unusual for her.  There was initial report of 7 PM on the code stroke activation as her last known well,  but this was established as her first seen abnormal.    Subsequently at the facility she had decompensation and was found to have left gaze deviation with complete unresponsiveness.  EMS was called at that time, on EMS arrival she was improving and was able to tell me her name, though she was dysarthric and some of her speech did not make sense.  This is further complicated by the fact that she is extremely hard of hearing.  My initial attempts to contact the facility failed, as well as my initial attempt to contact her daughter.  Her chart from the facility listed stroke with aphasia is one of her problems and so her baseline was unclear and she has is clearly rapidly improving and etiology of the unresponsiveness with gaze  deviation was unclear.   A CT/CTA was performed which did demonstrate a distal basilar occlusion, but last known well was still markedly unclear at this point.  I was finally able to get a hold of the facility to confirm that she did attend dinner from 5-6 30 and was normal at 630 when she returned to her room.   Following this additional information, I discussed with her daughter the risks, benefits, and alternatives to IV tenecteplase and decision was made to proceed with the medication.     LKW: 6:30 PM TNK given?:  Yes, there was delay due to IR Thrombectomy? No, DNR, does not want aggressive procedural interventions. Modified Rankin Scale: 2-Slight disability-UNABLE to perform all activities but does not need assistance  HOSPITAL COURSE  Raven Peterson is a 88 y.o. female with history of atrial fibrillation not on anticoagulation and stroke presenting after she was found to be unsteady, requiring assistance to get into bed, and then quickly developed unresponsiveness with left gaze deviation.  She was found to have a distal basilar occlusion on CTA and was given TNK with improvement in her symptoms.  She reportedly would not have wished to have aggressive interventions such  as thrombectomy.  Will plan to restart Eliquis for atrial fibrillation in the next few days.  Stroke: R PCA infarct with HT due to distal BA, right PCA occlusion s/p TNK, likely due to afib not on [facility] Code Stroke CT head No acute abnormality. ASPECTS 10.    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 Will start Mackay in 2 days if neuro stable Therapy recommendations:  HH Disposition:  back to [place]    Afib Patient has a history of a-fib Not on anticoagulation at home as it was discontinued when she was on hospice care Grandson denies frequent falls On aspirin now We will consider DOAC in 2 days if neuro stable   Hypertension Home meds:  none Stable Keep BP <160 given HT Long-term BP goal normotensive   Hyperlipidemia Home meds:  none LDL 148, goal < 70 Add rosuvastatin 20 mg daily  Continue statin at discharge   Fever  UTI Overnight low-grade fever Tmax 100.6-> afebrile UA WBC 21-50 On Rocephin   Other Stroke Risk Factors Advanced Age >/= 24  Hx stroke - details not clear   Other Active Problems Presumed seizure history, on Keppra 500 PTA, continue Keppra  RN Pressure Injury Documentation: Pressure Injury 01/14/21 Coccyx Medial Stage 1 -  Intact skin with non-blanchable redness of a localized area usually over a bony prominence. (Active)  01/14/21 1640  Location: Coccyx  Location Orientation: Medial  Staging: Stage 1 -  Intact skin with non-blanchable redness of a localized area usually over a  bony prominence.  Wound Description (Comments):   Present on Admission: Yes     DISCHARGE EXAM Blood pressure (!) 157/72, pulse 86, temperature 98.2 F (36.8 C), temperature source Oral, resp. rate 20, height 5\' 4"  (1.626 m), weight 69.4 kg, SpO2 97 %.  Temp:  [  97.8 F (36.6 C)-99.1 F (37.3 C)] 97.8 F (36.6 C) (10/02 1222) Pulse Rate:  [74-90] 74 (10/02 1222) Resp:  [18-20] 19 (10/02 1222) BP: (128-157)/(67-79) 138/73 (10/02 1222) SpO2:  [95 %-98 %] 95 % (10/02 1222)  General - Well nourished, well developed, in no apparent distress. Cardiovascular -A fib  Neuro awake, alert, eyes open, orientated to place, but not to time. No aphasia but paucity of speech, following all simple commands. Able to name and repeat. No gaze palsy, tracking bilaterally, visual field testing showed left upper quadrantanopia. No significant facial droop. Tongue midline. Bilateral UEs 3/5, no drift. Bilaterally LEs at least 3-/5, symmetrical. Sensation symmetrical bilaterally, b/l FTN intact grossly, gait not tested.   Discharge Diet       Diet   Diet regular Room service appropriate? Yes; Fluid consistency: Thin   liquids  DISCHARGE PLAN Disposition:  ALF  aspirin 81 mg daily for secondary stroke prevention. Consider starting AC in 3 days if neurologically stable and appropriate  Ongoing stroke risk factor control by Primary Care Physician at time of discharge Follow-up in [facility] in 8 weeks, office to schedule an appointment.   50 minutes were spent preparing discharge.  Beulah Gandy DNP, ACNPC-AG    ATTENDING NOTE: I reviewed above note and agree with the assessment and plan. Pt was seen and examined.   No family at the bedside. Pt lying in bed, AAO x3. Neuro stable. On ASA now, will start eliquis in 2 days in [place]. Continue statin. Follow up at [place] in 4 weeks.   For detailed assessment and plan, please refer to above/below as I have made changes  wherever appropriate.   Rosalin Hawking, MD PhD Stroke Neurology 09/11/2022 11:21 PM

## 2022-09-11 NOTE — TOC Transition Note (Signed)
Transition of Care [facility]) - CM/SW Discharge Note   Patient Details  Name: Raven Peterson MRN: [id number] Date of Birth: 1922/01/07  Transition of Care [facility]) CM/SW Contact:  Geralynn Ochs, LCSW Phone Number: 09/11/2022, 4:21 PM   Clinical Narrative:   CSW confirmed with Durenda Age that patient was able to return today. [name] will set up home health if orders are sent. CSW sent discharge information and home health orders, contacted [facility] to confirm they received. CSW sat on hold, unable to confirm with [name], so sent information in patient's discharge packet. CSW discussed with patient's daughter, Raven Peterson, and patient will need transport with [facility]. Transport arranged for next available.  Nurse to call report to [phone].    Final next level of care: Assisted Living Barriers to Discharge: Barriers Resolved   Patient Goals and CMS Choice Patient states their goals for this hospitalization and ongoing recovery are:: patient unable to participate in goal setting, not oriented CMS Medicare.gov Compare Post Acute Care list provided to:: Patient Represenative (must comment) Choice offered to / list presented to : Adult Children  Discharge Placement              Patient chooses bed at:  [facility]) Patient to be transferred to facility by: [place] Name of family member notified: Raven Peterson Patient and family notified of of transfer: 09/11/22  Discharge Plan and Services                                     Social Determinants of Health (SDOH) Interventions     Readmission Risk Interventions     No data to display

## 2022-09-11 NOTE — Progress Notes (Signed)
Physical Therapy Treatment Patient Details Name: Raven Peterson MRN: [id number] DOB: 27-Nov-1922 Today's Date: 09/11/2022   History of Present Illness The pt is a 88 yo female presenting 9/28 from [facility] after being found down unresponsive with L gaze preference. CT showed distal basilar occulsion. Pt given TNK. 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    PT Comments    Patient progressing slowly towards PT goals. Session focused on progressive ambulation and mobility. Pt requires Min A for standing and for gait training with use of RW for support. Noted to have weakness in BLEs but no buckling. Pt pleasantly confused throughout despite reorientation and very HOH. Plans to d/c home to [facility] today and hopefully some confusion will resolve in familiar home environment. Will follow.    Recommendations for follow up therapy are one component of a multi-disciplinary discharge planning process, led by the attending physician.  Recommendations may be updated based on patient status, additional functional criteria and insurance authorization.  Follow Up Recommendations  Home health PT (at [facility])     Assistance Recommended at Discharge Frequent or constant Supervision/Assistance  Patient can return home with the following A little help with walking and/or transfers;A little help with bathing/dressing/bathroom;Assistance with cooking/housework;Direct supervision/assist for medications management;Direct supervision/assist for financial management;Assist for transportation;Help with stairs or ramp for entrance   Equipment Recommendations  None recommended by PT    Recommendations for Other Services       Precautions / Restrictions Precautions Precautions: Fall;Other (comment) Precaution Comments: HOH Restrictions Weight Bearing Restrictions: No     Mobility  Bed Mobility Overal bed  mobility: Needs Assistance Bed Mobility: Supine to Sit, Sit to Supine     Supine to sit: Min assist, HOB elevated Sit to supine: Min assist, HOB elevated   General bed mobility comments: increased time, assist for trunk elevation and balance    Transfers Overall transfer level: Needs assistance Equipment used: Rolling walker (2 wheels) Transfers: Sit to/from Stand Sit to Stand: Min assist           General transfer comment: Min A to power to standing with cues for hand placement/technique. posterior bias initially.    Ambulation/Gait Ambulation/Gait assistance: Min assist Gait Distance (Feet): 80 Feet Assistive device: Rolling walker (2 wheels) Gait Pattern/deviations: Step-through pattern, Decreased stride length, Trunk flexed Gait velocity: decreased Gait velocity interpretation: <1.31 ft/sec, indicative of household ambulator   General Gait Details: Slow, mildly unsteady gait with assist for RW management. Bil knee instability but no buckling, Min A for balance.   Stairs             Wheelchair Mobility    Modified Rankin (Stroke Patients Only) Modified Rankin (Stroke Patients Only) Pre-Morbid Rankin Score: Slight disability Modified Rankin: Moderately severe disability     Balance Overall balance assessment: Needs assistance Sitting-balance support: Feet supported, No upper extremity supported Sitting balance-Leahy Scale: Fair     Standing balance support: During functional activity, Reliant on assistive device for balance Standing balance-Leahy Scale: Poor Standing balance comment: dependent on BUE support, initial posterior lean but corrected with continued standing                            Cognition Arousal/Alertness: Awake/alert Behavior During Therapy: WFL for tasks assessed/performed Overall Cognitive Status: No family/caregiver present to determine baseline cognitive functioning  General Comments: Confused, stating [place] he is 54. Follows simple commands. "i don't need therapy."Thinks she is at [place] despite re-orientation.        Exercises      General Comments General comments (skin integrity, edema, etc.): VSS on RA. HR up to 107 bpm.      Pertinent Vitals/Pain Pain Assessment Pain Assessment: No/denies pain    Home Living                          Prior Function            PT Goals (current goals can now be found in the care plan section) Progress towards PT goals: Progressing toward goals    Frequency    Min 3X/week      PT Plan Current plan remains appropriate    Co-evaluation              AM-PAC PT "6 Clicks" Mobility   Outcome Measure  Help needed turning from your back to your side while in a flat bed without using bedrails?: A Little Help needed moving from lying on your back to sitting on the side of a flat bed without using bedrails?: A Little Help needed moving to and from a bed to a chair (including a wheelchair)?: A Little Help needed standing up from a chair using your arms (e.g., wheelchair or bedside chair)?: A Little Help needed to walk in hospital room?: A Little Help needed climbing 3-5 steps with a railing? : A Lot 6 Click Score: 17    End of Session Equipment Utilized During Treatment: Gait belt Activity Tolerance: Patient tolerated treatment well Patient left: in bed;with call bell/phone within reach;with bed alarm set Nurse Communication: Mobility status PT Visit Diagnosis: Unsteadiness on feet (R26.81);Other abnormalities of gait and mobility (R26.89);Muscle weakness (generalized) (M62.81)     Time: [id number] PT Time Calculation (min) (ACUTE ONLY): 17 min  Charges:  $Gait Training: 8-22 mins                     Marisa Severin, PT, DPT Acute Rehabilitation Services Secure chat preferred Office [place] 09/11/2022,  1:52 PM

## 2022-09-11 NOTE — Care Management Important Message (Signed)
Important Message  Patient Details  Name: Raven Peterson MRN: [id number] Date of Birth: 08-Feb-1922   Medicare Important Message Given:  Yes     [name] [address] 09/11/2022, 4:01 PM

## 2022-09-11 NOTE — Plan of Care (Signed)
  Problem: Education: Goal: Knowledge of General Education information will improve Description: Including pain rating scale, medication(s)/side effects and non-pharmacologic comfort measures Outcome: Progressing   Problem: Health Behavior/Discharge Planning: Goal: Ability to manage health-related needs will improve Outcome: Progressing   Problem: Clinical Measurements: Goal: Ability to maintain clinical measurements within normal limits will improve Outcome: Progressing Goal: Will remain free from infection Outcome: Progressing Goal: Diagnostic test results will improve Outcome: Progressing Goal: Respiratory complications will improve Outcome: Progressing Goal: Cardiovascular complication will be avoided Outcome: Progressing   Problem: Activity: Goal: Risk for activity intolerance will decrease Outcome: Progressing   Problem: Nutrition: Goal: Adequate nutrition will be maintained Outcome: Progressing   Problem: Coping: Goal: Level of anxiety will decrease Outcome: Progressing   Problem: Elimination: Goal: Will not experience complications related to bowel motility Outcome: Progressing Goal: Will not experience complications related to urinary retention Outcome: Progressing   Problem: Pain Managment: Goal: General experience of comfort will improve Outcome: Progressing   Problem: Safety: Goal: Ability to remain free from injury will improve Outcome: Progressing   Problem: Skin Integrity: Goal: Risk for impaired skin integrity will decrease Outcome: Progressing   Problem: Education: Goal: Knowledge of disease or condition will improve Outcome: Progressing Goal: Knowledge of secondary prevention will improve (SELECT ALL) Outcome: Progressing Goal: Knowledge of patient specific risk factors will improve (INDIVIDUALIZE FOR PATIENT) Outcome: Progressing Goal: Individualized Educational Video(s) Outcome: Progressing   Problem: Education: Goal: Knowledge of  disease or condition will improve Outcome: Progressing Goal: Knowledge of secondary prevention will improve (SELECT ALL) Outcome: Progressing

## 2022-09-11 NOTE — NC FL2 (Signed)
[place] MEDICAID FL2 LEVEL OF CARE SCREENING TOOL     IDENTIFICATION  Patient Name: SMT LOKEY Birthdate: 09/14/1922 Sex: female Admission Date (Current Location): 09/07/2022  [facility] and [place] Number:  Herbalist and Address:  [facility], [place] [address] [place], [place] [address]      Provider Number: [id number]  Attending Physician Name and Address:  Stroke, Md, MD  Relative Name and Phone Number:       Current Level of Care: Hospital Recommended Level of Care: Assisted Living Facility Prior Approval Number:    Date Approved/Denied:   PASRR Number:    Discharge Plan: Other (Comment) (ALF)    Current Diagnoses: Patient Active Problem List   Diagnosis Date Noted   Atrial fibrillation ([place]) 09/11/2022   Depression 09/11/2022   UTI (urinary tract infection) 09/11/2022   Acute right arterial ischemic stroke, PCA (posterior cerebral artery) ([place]) 09/07/2022   Palliative care encounter 02/14/2022   Chronic midline low back pain 02/14/2022   Chronic heart failure with preserved ejection fraction (HFpEF) ([place]) 02/14/2022   Pressure injury of skin 01/16/2021   Dyspnea 06/01/2019   Chronic anticoagulation 05/30/2019   Hypothyroidism 05/30/2019   Hyperlipidemia 05/30/2019   Atherosclerosis of abdominal aorta ([place]) 05/30/2019   Elevated troponin level 05/30/2019   Dependent on walker for ambulation 05/30/2019   Essential hypertension 10/02/2014   GERD (gastroesophageal reflux disease) 10/02/2014   Glaucoma 10/02/2014   Seizure disorder ([place]) 10/02/2014   History of stroke 05/29/2014    Orientation RESPIRATION BLADDER Height & Weight     Self, Time, Place  Normal Incontinent Weight: 153 lb (69.4 kg) Height:  5\' 4"  (162.6 cm)  BEHAVIORAL SYMPTOMS/MOOD NEUROLOGICAL BOWEL NUTRITION STATUS      Incontinent Diet (regular)  AMBULATORY STATUS COMMUNICATION OF NEEDS Skin   Limited Assist Verbally Normal                        Personal Care Assistance Level of Assistance  Bathing, Feeding, Dressing Bathing Assistance: Limited assistance Feeding assistance: Limited assistance Dressing Assistance: Limited assistance     Functional Limitations Info             [place]  PT (By licensed PT), OT (By licensed OT)     PT Frequency: 3x/wk with home health OT Frequency: 3x/wk with home health            Contractures Contractures Info: Not present    Additional Factors Info  Code Status, Allergies, Psychotropic Code Status Info: DNR Allergies Info: Cimetidine, Naproxen Sodium, Fluzone (Influenza Virus Vaccine), Fosamax (Alendronate Sodium), Loratadine, Iodinated Contrast Media Psychotropic Info: Zoloft 50mg  daily           Discharge Medications: STOP taking these medications     BAZA PROTECT EX    benzonatate 100 MG capsule Commonly known as: TESSALON    methocarbamol 500 MG tablet Commonly known as: ROBAXIN    ondansetron 4 MG tablet Commonly known as: ZOFRAN           TAKE these medications     aspirin EC 81 MG tablet Take 1 tablet (81 mg total) by mouth daily. Swallow whole. Start taking on: September 12, 2022    Ensure Take 237 mLs by mouth 3 (three) times daily between meals.    furosemide 40 MG tablet Commonly known as: Lasix Take 1 tablet (40 mg total) by mouth daily.    gabapentin 100 MG  capsule Commonly known as: NEURONTIN Take 100 mg by mouth at bedtime.    HYDROcodone-acetaminophen 5-325 MG tablet Commonly known as: NORCO/VICODIN Take 1 tablet by mouth 3 (three) times daily as needed for moderate pain. What changed: when to take this    levETIRAcetam 500 MG tablet Commonly known as: KEPPRA Take 500 mg by mouth 2 (two) times daily.    levothyroxine 50 MCG tablet Commonly known as: SYNTHROID Take 50 mcg by mouth daily before breakfast.    LORazepam 0.5 MG tablet Commonly known as: ATIVAN Take 0.25 mg by mouth See admin  instructions. 0.25mg  oral in the evening And 0.25mg  oral every 12 hours as needed for breakthrough anxiety    omeprazole 20 MG capsule Commonly known as: PRILOSEC Take 20 mg by mouth at bedtime.    polyethylene glycol 17 g packet Commonly known as: MIRALAX / GLYCOLAX Take 17 g by mouth daily.    rosuvastatin 20 MG tablet Commonly known as: CRESTOR Take 1 tablet (20 mg total) by mouth daily. Start taking on: September 12, 2022    Senna Plus 8.6-50 MG Caps Generic drug: Sennosides-Docusate Sodium Take 1 capsule by mouth at bedtime.    sertraline 50 MG tablet Commonly known as: ZOLOFT Take 50 mg by mouth daily.    Relevant Imaging Results:  Relevant Lab Results:   Additional Information SS#: [id number]  Geralynn Ochs, LCSW

## 2022-09-11 NOTE — Care Management Important Message (Signed)
Important Message  Patient Details  Name: Raven Peterson MRN: [id number] Date of Birth: 01-May-1922   Medicare Important Message Given:  Yes     Ilse Billman 09/11/2022, 4:02 PM

## 2022-09-12 ENCOUNTER — Encounter: Payer: Self-pay | Admitting: Family Medicine

## 2022-09-12 ENCOUNTER — Non-Acute Institutional Stay: Payer: Medicare Other | Admitting: Family Medicine

## 2022-09-12 VITALS — BP 122/78 | HR 71 | Resp 20

## 2022-09-12 DIAGNOSIS — M545 Low back pain, unspecified: Secondary | ICD-10-CM

## 2022-09-12 DIAGNOSIS — Z515 Encounter for palliative care: Secondary | ICD-10-CM

## 2022-09-12 DIAGNOSIS — I63531 Cerebral infarction due to unspecified occlusion or stenosis of right posterior cerebral artery: Secondary | ICD-10-CM

## 2022-09-12 DIAGNOSIS — E785 Hyperlipidemia, unspecified: Secondary | ICD-10-CM

## 2022-09-12 NOTE — Progress Notes (Signed)
[place] Consult Note Telephone: [phone]  Fax: [phone]    Date of encounter: 09/12/22 4:33 PM PATIENT NAME: Raven Peterson [address] [place] [address]   [phone] (home)  DOB: Jul 05, 1922 MRN: [id number] PRIMARY CARE PROVIDER:    Patient, No Pcp Per,  No address on file None  REFERRING PROVIDER:   No referring provider defined for this encounter. N/A  RESPONSIBLE PARTY:    Contact Information     Name Relation Home Work [place], [place] Daughter [phone]  [phone]        I met face to face with patient in [place] and her granddaughter was visiting. Palliative Care was asked to follow this patient by consultation request of  No ref. provider found to address advance care planning and complex medical decision making. This is a follow up visit.                                   ASSESSMENT, SYMPTOM MANAGEMENT AND PLAN / RECOMMENDATIONS:    Acute right arterial ischemic stroke, PCA (posterior cerebral artery) s/p tenectaplase (TNK) with hemorrhagic transformation Recommend HH PT and OT.  Recommend urgent HH ST evaluation for dysphagia. Recommend aspiration precautions:  Upright for all intake and for 30-60 minutes after, monitor for coughing and choking after eating or drinking. Keep HOB elevated 30 degrees. Agree with Rosuvastatin 20 mg nightly, ASA 81 mg daily and keeping SBP under 160.  Ensure pt's follow up scheduled with [facility] in 4 weeks.  2.    Palliative Care Encounter Previously transitioned from Hospice to Palliative Care due to improved prognosis previously Has DNR, Address goals of care with daughter  3.  Chronic midline low back pain   Continue Gabapentin 100 mg QHS. Hydrocodone-acetaminophen decreased frequency at d/c from hospital to 5/325 mg TID prn moderate pain.    Advance Care Planning/Goals of Care: Goals include to  maximize quality of life and symptom management.    CODE STATUS: DNR     Follow up Palliative Care Visit: Palliative care will continue to follow for complex medical decision making, advance care planning, and clarification of goals. Return 4 weeks or prn.   This visit was coded based on medical decision making (MDM).  PPS: 50%  HOSPICE ELIGIBILITY/DIAGNOSIS: TBD  Chief Complaint:  [place] received a referral to follow up with patient for chronic disease management after improved prognosis with d/c from Hospice care. Palliative Care to assess advance directive and defining/refining goals of care with patient.   HISTORY OF PRESENT ILLNESS:  Raven Peterson is a 88 y.o. year old female with chronic heart failure with preserved EF, chronic low back pain, hypothyroidism, HTN, atherosclerosis, hx of stroke/seizure/dyspnea/SBO who was on chronic anticoagulation with Eliquis and likely taken off due to falls.  Staff reports that pt is independent with her ADLs.  She denies falls.  Denies nausea/vomiting, appetite has been good. Ambulance was called to transport pt to hospital as she was difficult to arouse. Pt denies pain, SOB, nausea, vomiting or constipation.  Facility staff says she has not been requesting medicine for pain which is unusual for her and that she has been a little more confused.  No records found of swallow evaluation in chart from hospital.   History obtained from review of EMR, discussion with [name].  Latest Ref Rng & Units 09/11/2022    2:58 AM 09/10/2022    8:20 AM 09/09/2022    8:34 AM  CBC  WBC 4.0 - 10.5 K/uL 8.1  8.2  9.5   Hemoglobin 12.0 - 15.0 g/dL 13.8  13.8  14.6   Hematocrit 36.0 - 46.0 % 40.8  40.5  42.8   Platelets 150 - 400 K/uL 200  216  230        Latest Ref Rng & Units 09/11/2022    2:58 AM 09/10/2022    8:20 AM 09/09/2022    8:34 AM  CMP  Glucose 70 - 99 mg/dL 116  118  111   BUN 8 - 23 mg/dL $Remove'17  17  15    'SmGRbtH$ Creatinine 0.44 - 1.00 mg/dL 1.06  1.13  1.10   Sodium 135 - 145 mmol/L 139  140  138   Potassium 3.5 - 5.1 mmol/L 3.9  4.0  4.6   Chloride 98 - 111 mmol/L 104  103  103   CO2 22 - 32 mmol/L $RemoveB'23  24  25   'nbnQZqVk$ Calcium 8.9 - 10.3 mg/dL 8.7  8.6  9.1    Lipid Panel     Component Value Date/Time   CHOL 230 (H) 09/09/2022 0349   TRIG 75 09/09/2022 0349   HDL 67 09/09/2022 0349   CHOLHDL 3.4 09/09/2022 0349   VLDL 15 09/09/2022 0349   LDLCALC 148 (H) 09/09/2022 0349   09/07/22 HGB A1c 5.7%  09/07/22 Stroke: R PCA infarct with Hemorrhagic Transformation due to distal Basilar Artery, right PCA occlusion s/p TNK, likely due to afib not on AC Follow up CT 9/30 recent posterior infarcts without midline shift or hematoma 2D Echo EF 60-65%, severe LVH, no atrial level shunt, mild aortic stenosis Pt had been d/c'd of anticoagulation when in Hospice care and due to advanced age/falls. Recommendation from d/c NP/MD was to keep BP < 160 and added Rosuvastatin 20 mg daily. She was also treated for UTI with Rocephin. Recommended ASA 81 mg daily and consideration of starting anticoagulation (eliquis). F/U with [facility] in 4 weeks.  09/07/22 CTA head and neck: IMPRESSION: 1. 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 2. Additional occlusion of the right PCA at the P1-P2 junction with distal reconstitution. 3. Intracranial atherosclerosis including severe bilateral M2 and A2 stenoses and mild-to-moderate ICA stenoses. 4. Severe stenosis of the origin of the nondominant right vertebral artery. 5.  Aortic Atherosclerosis (ICD10-I70.0). I reviewed available labs, medications, imaging, studies and related documents from the EMR.  Records reviewed and summarized above.   09/08/22 MRI brain wo contrast: IMPRESSION: Acute infarcts in the medial right occipital lobe and posterior hippocampus, with an additional focus of acute infarct  in the anteromedial right thalamus. The right occipital infarct is associated with gyriform hemosiderin deposition, likely hemorrhage, with mild local mass effect but no midline shift.   09/11/22: PT Assessment: Bed Mobility Overal bed mobility: Needs Assistance Bed Mobility: Supine to Sit, Sit to Supine Supine to sit: Min assist, HOB elevated Sit to supine: Min assist, HOB elevated General bed mobility comments: increased time, assist for trunk elevation and balance   Transfers Overall transfer level: Needs assistance Equipment used: Rolling walker (2 wheels) Transfers: Sit to/from Stand Sit to Stand: Min assist General transfer comment: Min A to power to standing with cues for hand placement/technique. posterior bias initially.   Ambulation/Gait Ambulation/Gait assistance: Min assist  Gait Distance (Feet): 80 Feet Assistive device: Rolling walker (2 wheels) Gait Pattern/deviations: Step-through pattern, Decreased stride length, Trunk flexed Gait velocity: decreased Gait velocity interpretation: <1.31 ft/sec, indicative of household ambulator General Gait Details: Slow, mildly unsteady gait with assist for RW management. Bil knee instability but no buckling, Min A for balance.     Stairs     Wheelchair Mobility   Modified Rankin (Stroke Patients Only) Modified Rankin (Stroke Patients Only) Pre-Morbid Rankin Score: Slight disability Modified Rankin: Moderately severe disability         Balance Overall balance assessment: Needs assistance Sitting-balance support: Feet supported, No upper extremity supported Sitting balance-Leahy Scale: Fair Standing balance support: During functional activity, Reliant on assistive device for balance Standing balance-Leahy Scale: Poor Standing balance comment: dependent on BUE support, initial posterior lean but corrected with continued standing   09/09/22 OT evaluation:    Harpreet was evaluated s/p the above admission list, she is from  [facility]. Pt is a poor historian but reports she ambulates with rollator, sponge bathes and indep completes BADLs. Upon evaluation pt had functional deficits due to impaired cognition, generalized weakness, poor balance, decreased activity tolerance and was dis-oriented despite cues. Overall she needed min A for bed mobility, min A +2 for sit<>stand transfers and Hellmer mobility with RW. Due to deficits listed below pt needs up to mod A for LB ADLs and min A for UB ADLs. She will benefit from OT acutely. Recommend pt d/c back to ALF with HHOT.  ADLs:   Eating/Feeding: Set up;Sitting Grooming: Set up;Sitting Upper Body Bathing: Minimal assistance;Sitting Lower Body Bathing: Moderate assistance;Sit to/from stand Upper Body Dressing : Set up;Sitting Lower Body Dressing: Moderate assistance;Sit to/from stand Toilet Transfer: Minimal assistance;+2 for safety/equipment;Ambulation;BSC/3in1;Rolling walker (2 wheels) Toileting- Clothing Manipulation and Hygiene: Min guard;Sitting/lateral lean Functional mobility during ADLs: Minimal assistance;+2 for safety/equipment;+2 for physical assistance General ADL Comments: limited by impaired cognition, generalized weakness, poor balance and activity tolerance  ROS General: NAD EYES: denies vision changes ENMT: denies dysphagia Cardiovascular: denies chest pain, denies DOE Pulmonary: denies cough, denies increased SOB Abdomen: endorses good appetite, denies constipation, endorses continence of bowel GU: denies dysuria MSK:  denies increased weakness, no recent falls reported Skin: denies rashes or wounds Neurological: denies insomnia, has midline low back pain particularly problematic in evenings Psych: Endorses positive mood Heme/lymph/immuno: denies bruises, abnormal bleeding  Physical Exam: Current and past weights: Last weight documented 153 lbs on 09/07/22, 141 lbs 8.6 oz as of 01/17/21 Constitutional: NAD General: thin ENMT: mildly hard of  hearing CV: S1S2, RRR with holosystolic murmur RSB, no LE edema Pulmonary: CTAB, no increased work of breathing, no cough, room air Abdomen: normo-active BS + 4 quadrants, soft and non tender, no ascites MSK: no sarcopenia, moves all extremities Skin: warm and dry, no rashes or wounds on visible skin Neuro:  no generalized weakness, noted cognitive impairment Psych: non-anxious affect, A and O x 3 Hem/lymph/immuno: no widespread bruising   Thank you for the opportunity to participate in the care of Ms. Cosman.  The palliative care team will continue to follow. Please call our office at [phone] if we can be of additional assistance.   Marijo Conception, FNP-C   COVID-19 PATIENT SCREENING TOOL Asked and negative response unless otherwise noted:   Have you had symptoms of covid, tested positive or been in contact with someone with symptoms/positive test in the past 5-10 days? No

## 2022-09-21 ENCOUNTER — Emergency Department (HOSPITAL_COMMUNITY): Payer: Medicare Other

## 2022-09-21 ENCOUNTER — Other Ambulatory Visit: Payer: Self-pay

## 2022-09-21 ENCOUNTER — Encounter (HOSPITAL_COMMUNITY): Payer: Self-pay | Admitting: Emergency Medicine

## 2022-09-21 ENCOUNTER — Inpatient Hospital Stay (HOSPITAL_COMMUNITY)
Admission: EM | Admit: 2022-09-21 | Discharge: 2022-09-28 | DRG: 309 | Disposition: A | Discharge status: Skilled Nursing Facility | Payer: Medicare Other | Source: Skilled Nursing Facility | Attending: Internal Medicine | Admitting: Internal Medicine

## 2022-09-21 DIAGNOSIS — G40909 Epilepsy, unspecified, not intractable, without status epilepticus: Secondary | ICD-10-CM

## 2022-09-21 DIAGNOSIS — E871 Hypo-osmolality and hyponatremia: Secondary | ICD-10-CM | POA: Diagnosis not present

## 2022-09-21 DIAGNOSIS — F32A Depression, unspecified: Secondary | ICD-10-CM | POA: Diagnosis present

## 2022-09-21 DIAGNOSIS — I4891 Unspecified atrial fibrillation: Secondary | ICD-10-CM | POA: Diagnosis not present

## 2022-09-21 DIAGNOSIS — I480 Paroxysmal atrial fibrillation: Secondary | ICD-10-CM | POA: Diagnosis not present

## 2022-09-21 DIAGNOSIS — K219 Gastro-esophageal reflux disease without esophagitis: Secondary | ICD-10-CM | POA: Diagnosis present

## 2022-09-21 DIAGNOSIS — Z887 Allergy status to serum and vaccine status: Secondary | ICD-10-CM

## 2022-09-21 DIAGNOSIS — S0990XA Unspecified injury of head, initial encounter: Secondary | ICD-10-CM

## 2022-09-21 DIAGNOSIS — I5032 Chronic diastolic (congestive) heart failure: Secondary | ICD-10-CM | POA: Diagnosis present

## 2022-09-21 DIAGNOSIS — Z91041 Radiographic dye allergy status: Secondary | ICD-10-CM

## 2022-09-21 DIAGNOSIS — E039 Hypothyroidism, unspecified: Secondary | ICD-10-CM | POA: Diagnosis present

## 2022-09-21 DIAGNOSIS — E876 Hypokalemia: Secondary | ICD-10-CM

## 2022-09-21 DIAGNOSIS — Z79890 Hormone replacement therapy (postmenopausal): Secondary | ICD-10-CM

## 2022-09-21 DIAGNOSIS — E785 Hyperlipidemia, unspecified: Secondary | ICD-10-CM | POA: Diagnosis present

## 2022-09-21 DIAGNOSIS — Z7982 Long term (current) use of aspirin: Secondary | ICD-10-CM

## 2022-09-21 DIAGNOSIS — Z886 Allergy status to analgesic agent status: Secondary | ICD-10-CM

## 2022-09-21 DIAGNOSIS — W19XXXA Unspecified fall, initial encounter: Secondary | ICD-10-CM

## 2022-09-21 DIAGNOSIS — Z66 Do not resuscitate: Secondary | ICD-10-CM | POA: Diagnosis present

## 2022-09-21 DIAGNOSIS — R54 Age-related physical debility: Secondary | ICD-10-CM | POA: Diagnosis present

## 2022-09-21 DIAGNOSIS — Z79899 Other long term (current) drug therapy: Secondary | ICD-10-CM

## 2022-09-21 DIAGNOSIS — F419 Anxiety disorder, unspecified: Secondary | ICD-10-CM

## 2022-09-21 DIAGNOSIS — I69354 Hemiplegia and hemiparesis following cerebral infarction affecting left non-dominant side: Secondary | ICD-10-CM

## 2022-09-21 DIAGNOSIS — R296 Repeated falls: Secondary | ICD-10-CM | POA: Diagnosis present

## 2022-09-21 DIAGNOSIS — I110 Hypertensive heart disease with heart failure: Secondary | ICD-10-CM | POA: Diagnosis present

## 2022-09-21 DIAGNOSIS — Z888 Allergy status to other drugs, medicaments and biological substances status: Secondary | ICD-10-CM

## 2022-09-21 DIAGNOSIS — I10 Essential (primary) hypertension: Secondary | ICD-10-CM | POA: Diagnosis present

## 2022-09-21 DIAGNOSIS — N179 Acute kidney failure, unspecified: Secondary | ICD-10-CM | POA: Diagnosis not present

## 2022-09-21 DIAGNOSIS — Z825 Family history of asthma and other chronic lower respiratory diseases: Secondary | ICD-10-CM

## 2022-09-21 DIAGNOSIS — H409 Unspecified glaucoma: Secondary | ICD-10-CM | POA: Diagnosis present

## 2022-09-21 DIAGNOSIS — Z8616 Personal history of COVID-19: Secondary | ICD-10-CM

## 2022-09-21 LAB — CBC WITH DIFFERENTIAL/PLATELET
Abs Immature Granulocytes: 0.06 10*3/uL (ref 0.00–0.07)
Basophils Absolute: 0 10*3/uL (ref 0.0–0.1)
Basophils Relative: 0 %
Eosinophils Absolute: 0.1 10*3/uL (ref 0.0–0.5)
Eosinophils Relative: 1 %
HCT: 36.2 % (ref 36.0–46.0)
Hemoglobin: 12.4 g/dL (ref 12.0–15.0)
Immature Granulocytes: 1 %
Lymphocytes Relative: 10 %
Lymphs Abs: 0.8 10*3/uL (ref 0.7–4.0)
MCH: 32.5 pg (ref 26.0–34.0)
MCHC: 34.3 g/dL (ref 30.0–36.0)
MCV: 95 fL (ref 80.0–100.0)
Monocytes Absolute: 0.8 10*3/uL (ref 0.1–1.0)
Monocytes Relative: 10 %
Neutro Abs: 6.4 10*3/uL (ref 1.7–7.7)
Neutrophils Relative %: 78 %
Platelets: 382 10*3/uL (ref 150–400)
RBC: 3.81 MIL/uL — ABNORMAL LOW (ref 3.87–5.11)
RDW: 12.2 % (ref 11.5–15.5)
WBC: 8.1 10*3/uL (ref 4.0–10.5)
nRBC: 0 % (ref 0.0–0.2)

## 2022-09-21 LAB — COMPREHENSIVE METABOLIC PANEL
ALT: 13 U/L (ref 0–44)
AST: 21 U/L (ref 15–41)
Albumin: 2.6 g/dL — ABNORMAL LOW (ref 3.5–5.0)
Alkaline Phosphatase: 62 U/L (ref 38–126)
Anion gap: 11 (ref 5–15)
BUN: 15 mg/dL (ref 8–23)
CO2: 24 mmol/L (ref 22–32)
Calcium: 8.1 mg/dL — ABNORMAL LOW (ref 8.9–10.3)
Chloride: 100 mmol/L (ref 98–111)
Creatinine, Ser: 1.07 mg/dL — ABNORMAL HIGH (ref 0.44–1.00)
GFR, Estimated: 47 mL/min — ABNORMAL LOW (ref >60)
Glucose, Bld: 121 mg/dL — ABNORMAL HIGH (ref 70–99)
Potassium: 3.1 mmol/L — ABNORMAL LOW (ref 3.5–5.1)
Sodium: 135 mmol/L (ref 135–145)
Total Bilirubin: 0.7 mg/dL (ref 0.3–1.2)
Total Protein: 6 g/dL — ABNORMAL LOW (ref 6.5–8.1)

## 2022-09-21 MED ORDER — METOPROLOL TARTRATE 5 MG/5ML IV SOLN
5.0000 mg | Freq: Once | INTRAVENOUS | Status: AC
  Administered 2022-09-21: 5 mg via INTRAVENOUS
  Filled 2022-09-21: qty 5

## 2022-09-21 MED ORDER — METOPROLOL TARTRATE 25 MG PO TABS
12.5000 mg | ORAL_TABLET | Freq: Once | ORAL | Status: AC
  Administered 2022-09-21: 12.5 mg via ORAL
  Filled 2022-09-21: qty 1

## 2022-09-21 NOTE — ED Provider Notes (Signed)
Blood pressure 121/63, pulse (!) 59, temperature (!) 97.2 F (36.2 C), temperature source Oral, resp. rate (!) 25, height 5\' 4"  (1.626 m), weight 69.4 kg, SpO2 95 %.  Assuming care from Dr. Maryan Rued.  In [place], Raven Peterson is a 88 y.o. female with a chief complaint of Fall .  Refer to the original H&P for additional details.  The current plan of care is to follow up on HR and reassess. CT imaging independently interpreted and agree with radiology interpretation.   12:55 AM  Patient reevaluated after trial of oral metoprolol for rate control after IV metoprolol seem to reduce heart rate well.  On reevaluation, patient has heart rate in the 120s again.  Plan to replace her potassium.  Have sent off magnesium and TSH.  No traumatic finding on CT. patient looking well.  Daughter at bedside.  Plan for admit for A fib rvr and K+ replacement.   CRITICAL CARE Performed by: Margette Fast Total critical care time: 35 minutes Critical care time was exclusive of separately billable procedures and treating other patients. Critical care was necessary to treat or prevent imminent or life-threatening deterioration. Critical care was time spent personally by me on the following activities: development of treatment plan with patient and/or surrogate as well as nursing, discussions with consultants, evaluation of patient's response to treatment, examination of patient, obtaining history from patient or surrogate, ordering and performing treatments and interventions, ordering and review of laboratory studies, ordering and review of radiographic studies, pulse oximetry and re-evaluation of patient's condition.  Raven Quinton, MD Emergency Medicine    EKG Interpretation  Date/Time:  Thursday September 21 2022 22:12:01 EDT Ventricular Rate:  118 PR Interval:    QRS Duration: 96 QT Interval:  328 QTC Calculation: 460 R Axis:   42 Text Interpretation: recurrent Atrial fibrillation with rapid V-rate Paired  ventricular premature complexes Probable anteroseptal infarct, recent Confirmed by Blanchie Dessert [phone]) on 09/21/2022 10:41:20 PM       Discussed patient's case with [facility] to request admission. Patient and family (if present) updated with plan. Care transferred to [facility] service.  I reviewed all nursing notes, vitals, pertinent old records, EKGs, labs, imaging (as available).     Margette Fast, MD 09/22/22 0130

## 2022-09-21 NOTE — ED Notes (Signed)
Daughter arrived, being updated by Provider.

## 2022-09-21 NOTE — ED Notes (Signed)
Trauma Response Nurse Documentation   Raven Peterson is a 88 y.o. female arriving to [facility] ED via EMS  On Eliquis (apixaban) daily. Trauma was activated as a Level 2 by ED charge RN based on the following trauma criteria Elderly patients > 65 with head trauma on anti-coagulation (excluding ASA). Trauma team at the bedside on patient arrival.   Patient cleared for CT by Dr. Maryan Rued EDP. Pt transported to CT with trauma response nurse present to monitor. RN remained with the patient throughout their absence from the department for clinical observation.   GCS 15.  History   Past Medical History:  Diagnosis Date   Acute heart failure with preserved ejection fraction (HFpEF) ([facility]) 01/13/2021   Acute ischemic stroke ([place]) 10/02/2014   right cerebellar and left posterior temporal CVA: This was confirmed on MRI. We suspected an embolic source given the distribution on MRI. She has no history of atrial fib and no atrial fib was seen on telemetry. She was seen by neurology in consultation and underwent carotid US which showed mild right carotid stenosis (<50%) and moderate left carotid stenosis (50-69%). She was treated with aspiri   Acute on chronic heart failure with preserved ejection fraction (HFpEF) ([facility])    CAP (community acquired pneumonia) 03/17/2015   Chronic back pain    "used to get shots in my back by a Pain Management doctor"   COVID-19    Dyspnea    GERD (gastroesophageal reflux disease)    Glaucoma    Hyperlipidemia    Hypertension    Hypothyroidism    Ileus ([facility]) 05/30/2019   Inferior pubic ramus fracture, right, with routine healing, subsequent encounter    Pneumonia due to COVID-19 virus 12/10/2020   SBO (small bowel obstruction) ([facility] [place]) 05/30/2019   Seizure ([place]) 10/02/2014   Small bowel obstruction ([facility])    Thyroid disease    UTI from E.coli 2018   Vertebral compression fracture [facility])      Past Surgical History:  Procedure Laterality Date   CATARACT EXTRACTION, BILATERAL      CHOLECYSTECTOMY         Initial Focused Assessment (If applicable, or please see trauma documentation): Alert and oriented female arrives via EMS from [facility] after an unwitnessed fall. Pt reports headache, states she isn't sure how she fell "just went down." Airway patent, unobstructed, BS clear No obvious uncontrolled hemorrhage GCS 15 PERRLA 75mm  CT's Completed:   CT Head   Interventions:  IV start and trauma lab draw Portable chest XRAY, pelvis deferred by Dr. Maryan Rued CT head Metoprolol IV push for tachycardia  Plan for disposition:  Discharge home anticipated  Consults completed:  none at the time of this note.  Event Summary: Patient arrives via EMS from SNF after an unwitnessed fall. Pt reports hitting her head. No obvious injuries/deformities. Escorted to CT. Trauma workup negative, anticipate d/c back to facility.  MTP Summary (If applicable): NA  Bedside handoff with ED RN Anderson Malta.    Jenesys Casseus [place]  Trauma Response RN  Please call TRN at [phone] for further assistance.

## 2022-09-21 NOTE — ED Provider Notes (Signed)
[facility] EMERGENCY DEPARTMENT Provider Note   CSN: [id number] Arrival date & time: 09/21/22  2200     History  Chief Complaint  Patient presents with   Raven Peterson is a 88 y.o. female.  Patient is a 88 year old female with a history of recent acute stroke thought to be from atrial fibrillation that was not anticoagulated due to a history of frequent falls with now new left-sided deficits who presents today as a level 2 trauma after a fall.  Patient reports that she was getting out of bed and she collapsed hitting the back of her head on the floor.  She does not know why this happened but reports she was awake during the entire event.  She initially was having a headache but reports now her head feels okay.  This happened around 7 PM this evening.  She denies any chest pain, shortness of breath, abdominal pain or extremity pain.  She thinks she is taken all of her medications this evening and reports that the facility brings them to her and she takes them regularly.  Facility reported that patient was at her baseline when she left.  She had been well today.  The history is provided by the patient and medical records.  Fall       Home Medications Prior to Admission medications   Medication Sig Start Date End Date Taking? Authorizing Provider  aspirin EC 81 MG tablet Take 1 tablet (81 mg total) by mouth daily. Swallow whole. 09/12/22   August Albino, NP  Ensure (ENSURE) Take 237 mLs by mouth 3 (three) times daily between meals.    [provider]  furosemide (LASIX) 40 MG tablet Take 1 tablet (40 mg total) by mouth daily. 01/17/21 09/09/23  Lacinda Axon, MD  gabapentin (NEURONTIN) 100 MG capsule Take 100 mg by mouth at bedtime.    [provider]  HYDROcodone-acetaminophen (NORCO/VICODIN) 5-325 MG tablet Take 1 tablet by mouth 3 (three) times daily as needed for moderate pain. Patient taking differently: Take 1 tablet by mouth every  4 (four) hours. 06/03/19   Arrien, Jimmy Picket, MD  levETIRAcetam (KEPPRA) 500 MG tablet Take 500 mg by mouth 2 (two) times daily.    [provider]  levothyroxine (SYNTHROID) 50 MCG tablet Take 50 mcg by mouth daily before breakfast. 07/13/15   [provider]  LORazepam (ATIVAN) 0.5 MG tablet Take 0.25 mg by mouth See admin instructions. 0.25mg  oral in the evening And 0.25mg  oral every 12 hours as needed for breakthrough anxiety    [provider]  omeprazole (PRILOSEC) 20 MG capsule Take 20 mg by mouth at bedtime.    [provider]  polyethylene glycol (MIRALAX / GLYCOLAX) 17 g packet Take 17 g by mouth daily. 06/03/19   Arrien, Jimmy Picket, MD  rosuvastatin (CRESTOR) 20 MG tablet Take 1 tablet (20 mg total) by mouth daily. 09/12/22   August Albino, NP  SENNA PLUS 50-8.6 MG CAPS Take 1 capsule by mouth at bedtime. 12/20/20   [provider]  sertraline (ZOLOFT) 50 MG tablet Take 50 mg by mouth daily.    [provider]      Allergies    Cimetidine, Naproxen sodium, Fluzone [influenza virus vaccine], Fosamax [alendronate sodium], Loratadine, and Iodinated contrast media    Review of Systems   Review of Systems  Physical Exam Updated Vital Signs BP (!) 110/58   Pulse 80   Temp (!)  97.2 F (36.2 C) (Oral)   Resp 18   Ht 5\' 4"  (1.626 m)   Wt 69.4 kg   SpO2 95%   BMI 26.26 kg/m  Physical Exam Vitals and nursing note reviewed.  Constitutional:      General: She is not in acute distress.    Appearance: She is well-developed.  HENT:     Head: Normocephalic and atraumatic.     Comments: No obvious signs of trauma to the head Eyes:     Pupils: Pupils are equal, round, and reactive to light.  Cardiovascular:     Rate and Rhythm: Tachycardia present. Rhythm irregularly irregular.     Heart sounds: Normal heart sounds. No murmur heard.    No friction rub.  Pulmonary:     Effort: Pulmonary effort is normal.     Breath  sounds: Normal breath sounds. No wheezing or rales.  Abdominal:     General: Bowel sounds are normal. There is no distension.     Palpations: Abdomen is soft.     Tenderness: There is no abdominal tenderness. There is no guarding or rebound.  Musculoskeletal:        General: No tenderness. Normal range of motion.     Cervical back: Normal range of motion and neck supple. No tenderness.     Right lower leg: No edema.     Left lower leg: No edema.     Comments: No edema  Skin:    General: Skin is warm and dry.     Findings: No rash.  Neurological:     Mental Status: She is alert and oriented to person, place, and time. Mental status is at baseline.     Cranial Nerves: No cranial nerve deficit.     Comments: Weakness noted to the left lower extremity, 4 out of 5 compared to the right lower extremity.  5 out of 5 strength in bilateral upper extremities  Psychiatric:        Mood and Affect: Mood normal.        Behavior: Behavior normal.     ED Results / Procedures / Treatments   Labs (all labs ordered are listed, but only abnormal results are displayed) Labs Reviewed  CBC WITH DIFFERENTIAL/PLATELET  COMPREHENSIVE METABOLIC PANEL    EKG EKG Interpretation  Date/Time:  Thursday September 21 2022 22:12:01 EDT Ventricular Rate:  118 PR Interval:    QRS Duration: 96 QT Interval:  328 QTC Calculation: 460 R Axis:   42 Text Interpretation: recurrent Atrial fibrillation with rapid V-rate Paired ventricular premature complexes Probable anteroseptal infarct, recent Confirmed by Blanchie Dessert [phone]) on 09/21/2022 10:41:20 PM  Radiology DG Chest Portable 1 View  Result Date: 09/21/2022 CLINICAL DATA:  Level 2 trauma, fall. EXAM: PORTABLE CHEST 1 VIEW COMPARISON:  09/07/2022. FINDINGS: The heart is enlarged and the mediastinal contour is stable. There is atherosclerotic calcification of the aorta. Lung volumes are low. Mild interstitial prominence is present bilaterally and likely  chronic. No consolidation, effusion, or pneumothorax. Mild apical pleural scarring is noted bilaterally. No acute osseous abnormality. IMPRESSION: No active disease. Electronically Signed   By: Brett Fairy M.D.   On: 09/21/2022 22:39    Procedures Procedures    Medications Ordered in ED Medications  metoprolol tartrate (LOPRESSOR) injection 5 mg (has no administration in time range)    ED Course/ Medical Decision Making/ A&P  Medical Decision Making Amount and/or Complexity of Data Reviewed External Data Reviewed: notes.    Details: Recent hospitalization Labs: ordered. Radiology: ordered. ECG/medicine tests: ordered and independent interpretation performed. Decision-making details documented in ED Course.  Risk Prescription drug management.   Pt with multiple medical problems and comorbidities and presenting today with a complaint that caries a high risk for morbidity and mortality.  Patient here today as a level 2 trauma due to a fall on thinners.  Patient reports she did hit her head but there is no evidence of trauma.  Patient unfortunately with recent stroke was discharged last week with new left-sided weakness.  Patient is been started on Eliquis for atrial fibrillation and stroke.  Question if patient should be on Eliquis due to falls concern for head injury.  Patient is awake and alert at this time.  She is in no acute distress.  Does not appear to have extremity fracture at this time.  We will do a head CT to ensure no evidence of intracranial bleed or delayed bleed from recent TNK.  Patient is noted to be in A-fib RVR today with heart rates up to 140.  Patient does have a history of A-fib but during last hospitalization she was in sinus rhythm.  Based on evaluation from her external medical records patient is not on any rate controlling medications.  We will give a dose of Lopressor for rate control.  Labs pending to ensure no acute abnormality.  I  independently interpreted patient's EKG which showed A-fib RVR today but no other acute changes.  Labs and imaging are pending.  Patient checked out to Dr. Laverta Baltimore         Final Clinical Impression(s) / ED Diagnoses Final diagnoses:  None    Rx / DC Orders ED Discharge Orders     None         Blanchie Dessert, MD 09/21/22 2323

## 2022-09-21 NOTE — ED Notes (Signed)
Pt and TRN to CT. 

## 2022-09-21 NOTE — ED Triage Notes (Signed)
Per EMS, pt from [facility] [address], staff stated she had a "mechanical fall that was unwitnessed."  Pt is at baseline per staff, "she has her good days and her bad days."  She is A/O X3.  She states she fell and his the right side of her head earlier today.

## 2022-09-22 ENCOUNTER — Inpatient Hospital Stay (HOSPITAL_COMMUNITY): Payer: Medicare Other

## 2022-09-22 DIAGNOSIS — Z886 Allergy status to analgesic agent status: Secondary | ICD-10-CM | POA: Diagnosis not present

## 2022-09-22 DIAGNOSIS — E876 Hypokalemia: Secondary | ICD-10-CM

## 2022-09-22 DIAGNOSIS — Z79890 Hormone replacement therapy (postmenopausal): Secondary | ICD-10-CM | POA: Diagnosis not present

## 2022-09-22 DIAGNOSIS — W19XXXA Unspecified fall, initial encounter: Secondary | ICD-10-CM

## 2022-09-22 DIAGNOSIS — Y92009 Unspecified place in unspecified non-institutional (private) residence as the place of occurrence of the external cause: Secondary | ICD-10-CM

## 2022-09-22 DIAGNOSIS — E039 Hypothyroidism, unspecified: Secondary | ICD-10-CM | POA: Diagnosis present

## 2022-09-22 DIAGNOSIS — I10 Essential (primary) hypertension: Secondary | ICD-10-CM | POA: Diagnosis not present

## 2022-09-22 DIAGNOSIS — F32A Depression, unspecified: Secondary | ICD-10-CM

## 2022-09-22 DIAGNOSIS — F419 Anxiety disorder, unspecified: Secondary | ICD-10-CM | POA: Diagnosis present

## 2022-09-22 DIAGNOSIS — G40909 Epilepsy, unspecified, not intractable, without status epilepticus: Secondary | ICD-10-CM | POA: Diagnosis present

## 2022-09-22 DIAGNOSIS — I4891 Unspecified atrial fibrillation: Secondary | ICD-10-CM | POA: Diagnosis present

## 2022-09-22 DIAGNOSIS — I110 Hypertensive heart disease with heart failure: Secondary | ICD-10-CM | POA: Diagnosis present

## 2022-09-22 DIAGNOSIS — E785 Hyperlipidemia, unspecified: Secondary | ICD-10-CM | POA: Diagnosis present

## 2022-09-22 DIAGNOSIS — Z887 Allergy status to serum and vaccine status: Secondary | ICD-10-CM | POA: Diagnosis not present

## 2022-09-22 DIAGNOSIS — Z7982 Long term (current) use of aspirin: Secondary | ICD-10-CM | POA: Diagnosis not present

## 2022-09-22 DIAGNOSIS — I480 Paroxysmal atrial fibrillation: Secondary | ICD-10-CM | POA: Diagnosis present

## 2022-09-22 DIAGNOSIS — Z91041 Radiographic dye allergy status: Secondary | ICD-10-CM | POA: Diagnosis not present

## 2022-09-22 DIAGNOSIS — Z79899 Other long term (current) drug therapy: Secondary | ICD-10-CM | POA: Diagnosis not present

## 2022-09-22 DIAGNOSIS — N179 Acute kidney failure, unspecified: Secondary | ICD-10-CM | POA: Diagnosis not present

## 2022-09-22 DIAGNOSIS — Z888 Allergy status to other drugs, medicaments and biological substances status: Secondary | ICD-10-CM | POA: Diagnosis not present

## 2022-09-22 DIAGNOSIS — E871 Hypo-osmolality and hyponatremia: Secondary | ICD-10-CM | POA: Diagnosis not present

## 2022-09-22 DIAGNOSIS — I69354 Hemiplegia and hemiparesis following cerebral infarction affecting left non-dominant side: Secondary | ICD-10-CM | POA: Diagnosis not present

## 2022-09-22 DIAGNOSIS — R54 Age-related physical debility: Secondary | ICD-10-CM | POA: Diagnosis present

## 2022-09-22 DIAGNOSIS — Z66 Do not resuscitate: Secondary | ICD-10-CM | POA: Diagnosis present

## 2022-09-22 DIAGNOSIS — Z8616 Personal history of COVID-19: Secondary | ICD-10-CM | POA: Diagnosis not present

## 2022-09-22 DIAGNOSIS — K219 Gastro-esophageal reflux disease without esophagitis: Secondary | ICD-10-CM | POA: Diagnosis present

## 2022-09-22 DIAGNOSIS — I5032 Chronic diastolic (congestive) heart failure: Secondary | ICD-10-CM | POA: Diagnosis present

## 2022-09-22 DIAGNOSIS — R296 Repeated falls: Secondary | ICD-10-CM | POA: Diagnosis present

## 2022-09-22 LAB — CBC
HCT: 35.1 % — ABNORMAL LOW (ref 36.0–46.0)
Hemoglobin: 12.1 g/dL (ref 12.0–15.0)
MCH: 32.1 pg (ref 26.0–34.0)
MCHC: 34.5 g/dL (ref 30.0–36.0)
MCV: 93.1 fL (ref 80.0–100.0)
Platelets: 388 10*3/uL (ref 150–400)
RBC: 3.77 MIL/uL — ABNORMAL LOW (ref 3.87–5.11)
RDW: 12.1 % (ref 11.5–15.5)
WBC: 9.6 10*3/uL (ref 4.0–10.5)
nRBC: 0 % (ref 0.0–0.2)

## 2022-09-22 LAB — TSH: TSH: 4.509 u[IU]/mL — ABNORMAL HIGH (ref 0.350–4.500)

## 2022-09-22 LAB — BASIC METABOLIC PANEL
Anion gap: 10 (ref 5–15)
BUN: 16 mg/dL (ref 8–23)
CO2: 26 mmol/L (ref 22–32)
Calcium: 8.1 mg/dL — ABNORMAL LOW (ref 8.9–10.3)
Chloride: 98 mmol/L (ref 98–111)
Creatinine, Ser: 1.08 mg/dL — ABNORMAL HIGH (ref 0.44–1.00)
GFR, Estimated: 47 mL/min — ABNORMAL LOW (ref >60)
Glucose, Bld: 137 mg/dL — ABNORMAL HIGH (ref 70–99)
Potassium: 3 mmol/L — ABNORMAL LOW (ref 3.5–5.1)
Sodium: 134 mmol/L — ABNORMAL LOW (ref 135–145)

## 2022-09-22 LAB — MAGNESIUM: Magnesium: 1.9 mg/dL (ref 1.7–2.4)

## 2022-09-22 MED ORDER — SERTRALINE HCL 50 MG PO TABS
50.0000 mg | ORAL_TABLET | Freq: Every day | ORAL | Status: DC
  Administered 2022-09-22 – 2022-09-28 (×7): 50 mg via ORAL
  Filled 2022-09-22 (×7): qty 1

## 2022-09-22 MED ORDER — ASPIRIN 81 MG PO TBEC
81.0000 mg | DELAYED_RELEASE_TABLET | Freq: Every day | ORAL | Status: DC
  Administered 2022-09-22: 81 mg via ORAL
  Filled 2022-09-22: qty 1

## 2022-09-22 MED ORDER — POLYETHYLENE GLYCOL 3350 17 G PO PACK
17.0000 g | PACK | Freq: Every day | ORAL | Status: DC
  Administered 2022-09-22 – 2022-09-28 (×7): 17 g via ORAL
  Filled 2022-09-22 (×7): qty 1

## 2022-09-22 MED ORDER — APIXABAN 5 MG PO TABS: 5.0000 mg | ORAL_TABLET | Freq: Two times a day (BID) | ORAL | Status: DC

## 2022-09-22 MED ORDER — POTASSIUM CHLORIDE 10 MEQ/100ML IV SOLN
10.0000 meq | INTRAVENOUS | Status: DC
  Administered 2022-09-22 (×2): 10 meq via INTRAVENOUS
  Filled 2022-09-22 (×3): qty 100

## 2022-09-22 MED ORDER — SENNOSIDES-DOCUSATE SODIUM 8.6-50 MG PO TABS
1.0000 | ORAL_TABLET | Freq: Every day | ORAL | Status: DC
  Administered 2022-09-22 – 2022-09-27 (×7): 1 via ORAL
  Filled 2022-09-22 (×7): qty 1

## 2022-09-22 MED ORDER — APIXABAN 5 MG PO TABS
5.0000 mg | ORAL_TABLET | Freq: Two times a day (BID) | ORAL | Status: DC
  Administered 2022-09-22 – 2022-09-24 (×6): 5 mg via ORAL
  Filled 2022-09-22 (×6): qty 1

## 2022-09-22 MED ORDER — METOPROLOL TARTRATE 5 MG/5ML IV SOLN
5.0000 mg | Freq: Once | INTRAVENOUS | Status: AC
  Administered 2022-09-22: 5 mg via INTRAVENOUS
  Filled 2022-09-22: qty 5

## 2022-09-22 MED ORDER — ACETAMINOPHEN 325 MG PO TABS
650.0000 mg | ORAL_TABLET | Freq: Four times a day (QID) | ORAL | Status: DC | PRN
  Administered 2022-09-24 – 2022-09-26 (×3): 650 mg via ORAL
  Filled 2022-09-22 (×3): qty 2

## 2022-09-22 MED ORDER — METOPROLOL TARTRATE 25 MG PO TABS
25.0000 mg | ORAL_TABLET | Freq: Two times a day (BID) | ORAL | Status: DC
  Administered 2022-09-22 – 2022-09-28 (×13): 25 mg via ORAL
  Filled 2022-09-22 (×13): qty 1

## 2022-09-22 MED ORDER — POTASSIUM CHLORIDE 20 MEQ PO PACK
40.0000 meq | PACK | Freq: Once | ORAL | Status: DC
  Filled 2022-09-22: qty 2

## 2022-09-22 MED ORDER — MAGNESIUM HYDROXIDE 400 MG/5ML PO SUSP: 30.0000 mL | Freq: Every day | ORAL | Status: DC | PRN

## 2022-09-22 MED ORDER — MAGNESIUM SULFATE 2 GM/50ML IV SOLN
2.0000 g | Freq: Once | INTRAVENOUS | Status: AC
  Administered 2022-09-22: 2 g via INTRAVENOUS
  Filled 2022-09-22: qty 50

## 2022-09-22 MED ORDER — ROSUVASTATIN CALCIUM 20 MG PO TABS
20.0000 mg | ORAL_TABLET | Freq: Every day | ORAL | Status: DC
  Administered 2022-09-22 – 2022-09-28 (×7): 20 mg via ORAL
  Filled 2022-09-22 (×7): qty 1

## 2022-09-22 MED ORDER — GABAPENTIN 100 MG PO CAPS
100.0000 mg | ORAL_CAPSULE | Freq: Every day | ORAL | Status: DC
  Administered 2022-09-22 – 2022-09-27 (×7): 100 mg via ORAL
  Filled 2022-09-22 (×7): qty 1

## 2022-09-22 MED ORDER — ONDANSETRON HCL 4 MG/2ML IJ SOLN
4.0000 mg | Freq: Four times a day (QID) | INTRAMUSCULAR | Status: DC | PRN
  Administered 2022-09-22: 4 mg via INTRAVENOUS
  Filled 2022-09-22: qty 2

## 2022-09-22 MED ORDER — SODIUM CHLORIDE 0.9 % IV SOLN: INTRAVENOUS | Status: DC

## 2022-09-22 MED ORDER — PANTOPRAZOLE SODIUM 40 MG PO TBEC
40.0000 mg | DELAYED_RELEASE_TABLET | Freq: Every day | ORAL | Status: DC
  Administered 2022-09-22 – 2022-09-28 (×7): 40 mg via ORAL
  Filled 2022-09-22 (×7): qty 1

## 2022-09-22 MED ORDER — TRAZODONE HCL 50 MG PO TABS
25.0000 mg | ORAL_TABLET | Freq: Every evening | ORAL | Status: DC | PRN
  Administered 2022-09-22 – 2022-09-23 (×2): 25 mg via ORAL
  Filled 2022-09-22 (×2): qty 1

## 2022-09-22 MED ORDER — LEVETIRACETAM 500 MG PO TABS
500.0000 mg | ORAL_TABLET | Freq: Two times a day (BID) | ORAL | Status: DC
  Administered 2022-09-22 – 2022-09-28 (×14): 500 mg via ORAL
  Filled 2022-09-22 (×14): qty 1

## 2022-09-22 MED ORDER — ONDANSETRON HCL 4 MG PO TABS: 4.0000 mg | ORAL_TABLET | Freq: Four times a day (QID) | ORAL | Status: DC | PRN

## 2022-09-22 MED ORDER — ACETAMINOPHEN 650 MG RE SUPP: 650.0000 mg | Freq: Four times a day (QID) | RECTAL | Status: DC | PRN

## 2022-09-22 MED ORDER — SODIUM CHLORIDE 0.9 % IV BOLUS
500.0000 mL | Freq: Once | INTRAVENOUS | Status: AC
  Administered 2022-09-22: 500 mL via INTRAVENOUS

## 2022-09-22 MED ORDER — LORAZEPAM 0.5 MG PO TABS
0.2500 mg | ORAL_TABLET | Freq: Two times a day (BID) | ORAL | Status: DC | PRN
  Administered 2022-09-22: 0.25 mg via ORAL
  Filled 2022-09-22: qty 1

## 2022-09-22 MED ORDER — LORAZEPAM 0.5 MG PO TABS
0.2500 mg | ORAL_TABLET | Freq: Every day | ORAL | Status: DC
  Administered 2022-09-22 – 2022-09-27 (×6): 0.25 mg via ORAL
  Filled 2022-09-22 (×7): qty 1

## 2022-09-22 MED ORDER — BOOST / RESOURCE BREEZE PO LIQD CUSTOM
237.0000 mL | Freq: Three times a day (TID) | ORAL | Status: DC
  Administered 2022-09-22 – 2022-09-27 (×16): 1 via ORAL
  Filled 2022-09-22 (×2): qty 1

## 2022-09-22 MED ORDER — POTASSIUM CHLORIDE 10 MEQ/100ML IV SOLN
10.0000 meq | INTRAVENOUS | Status: AC
  Administered 2022-09-22 (×3): 10 meq via INTRAVENOUS
  Filled 2022-09-22 (×3): qty 100

## 2022-09-22 MED ORDER — LEVOTHYROXINE SODIUM 50 MCG PO TABS
50.0000 ug | ORAL_TABLET | Freq: Every day | ORAL | Status: DC
  Administered 2022-09-22 – 2022-09-28 (×7): 50 ug via ORAL
  Filled 2022-09-22 (×6): qty 1
  Filled 2022-09-22: qty 2

## 2022-09-22 MED ORDER — ENOXAPARIN SODIUM 30 MG/0.3ML IJ SOSY: 30.0000 mg | PREFILLED_SYRINGE | INTRAMUSCULAR | Status: DC

## 2022-09-22 NOTE — Assessment & Plan Note (Signed)
-   We will continue PPI therapy 

## 2022-09-22 NOTE — ED Notes (Signed)
Pt cleaned peri care and full linen change

## 2022-09-22 NOTE — H&P (Signed)
[facility] [place]   PATIENT NAME: Raven Peterson    MR#:  [id number]  DATE OF BIRTH:  12-Aug-1922  DATE OF ADMISSION:  09/21/2022  PRIMARY CARE PHYSICIAN: Patient, No Pcp Per   Patient is coming from: SNF  REQUESTING/REFERRING PHYSICIAN: Nanda Quinton, MD  CHIEF COMPLAINT:   Chief Complaint  Patient presents with   Fall    HISTORY OF PRESENT ILLNESS:  ELAHNI Peterson is a 88 y.o. Caucasian female with medical history significant for HFpEF, CVA, GERD, hypertension, dyslipidemia, paroxysmal atrial fibrillation on Eliquis, hypothyroidism and seizure disorder, who presented to the ER with acute onset of fall today with subsequent head injury.  She was getting out of bed and collapsed hitting the back of her head on the floor.  She did not know how it happened but reported being awake during the entire event.  She initially had a headache that has resolved later on.  She denies any chest pain or palpitations or cough or wheezing.  She admits to mild dyspnea though.  No nausea or vomiting or abdominal pain.  No dysuria, oliguria or hematuria or flank pain.  ED Course: Upon presentation to the emergency room, BP was 99/57, heart rate 136 and later 147 with otherwise normal vital signs.  Labs revealed hypokalemia of 3.1 and albumin 2.6 with total protein of 6 and otherwise unremarkable CMP.  CBC was within normal.  TSH was 4.5. EKG as reviewed by me :  EKG showed atrial fibrillation with rapid ventricular sponsor of 118 with Q waves anteroseptally. Imaging: Portable chest x-ray showed no acute cardiopulmonary disease.  Noncontrasted head CT scan showed no acute intra-cranial abnormalities.  The patient was given a dose of 5 mg IV Lopressor as well as 12.5 mg of p.o. Lopressor, 10 mcg IV potassium chloride and 500 mill IV normal saline bolus.  She will be admitted to a progressive unit bed for further evaluation and management. PAST MEDICAL HISTORY:   Past Medical History:  Diagnosis Date   Acute  heart failure with preserved ejection fraction (HFpEF) ([place]) 01/13/2021   Acute ischemic stroke ([place]) 10/02/2014   right cerebellar and left posterior temporal CVA: This was confirmed on MRI. We suspected an embolic source given the distribution on MRI. She has no history of atrial fib and no atrial fib was seen on telemetry. She was seen by neurology in consultation and underwent carotid US which showed mild right carotid stenosis (<50%) and moderate left carotid stenosis (50-69%). She was treated with aspiri   Acute on chronic heart failure with preserved ejection fraction (HFpEF) ([place])    CAP (community acquired pneumonia) 03/17/2015   Chronic back pain    "used to get shots in my back by a Pain Management doctor"   COVID-19    Dyspnea    GERD (gastroesophageal reflux disease)    Glaucoma    Hyperlipidemia    Hypertension    Hypothyroidism    Ileus ([place]) 05/30/2019   Inferior pubic ramus fracture, right, with routine healing, subsequent encounter    Pneumonia due to COVID-19 virus 12/10/2020   SBO (small bowel obstruction) ([place]) 05/30/2019   Seizure ([place]) 10/02/2014   Small bowel obstruction ([facility])    Thyroid disease    UTI from E.coli 2018   Vertebral compression fracture ([place])     PAST SURGICAL HISTORY:   Past Surgical History:  Procedure Laterality Date   CATARACT EXTRACTION, BILATERAL     CHOLECYSTECTOMY      SOCIAL  HISTORY:   Social History   Tobacco Use   Smoking status: Never   Smokeless tobacco: Never  Substance Use Topics   Alcohol use: No    FAMILY HISTORY:   Family History  Problem Relation Age of Onset   Healthy Mother    Asthma Father     DRUG ALLERGIES:   Allergies  Allergen Reactions   Cimetidine Palpitations and Other (See Comments)    "Allergic," per MAR   Naproxen Sodium Other (See Comments)    Affects glaucoma medication   Fluzone [Influenza Virus Vaccine] Other (See Comments)    "Allergic," per MAR   Fosamax [Alendronate Sodium] Other (See  Comments)    "Allergic," per MAR   Loratadine Other (See Comments)    "Allergic," per MAR   Iodinated Contrast Media Nausea Only    REVIEW OF SYSTEMS:   ROS As per history of present illness. All pertinent systems were reviewed above. Constitutional, HEENT, cardiovascular, respiratory, GI, GU, musculoskeletal, neuro, psychiatric, endocrine, integumentary and hematologic systems were reviewed and are otherwise negative/unremarkable except for positive findings mentioned above in the HPI.   MEDICATIONS AT HOME:   Prior to Admission medications   Medication Sig Start Date End Date Taking? Authorizing Provider  aspirin EC 81 MG tablet Take 1 tablet (81 mg total) by mouth daily. Swallow whole. 09/12/22   August Albino, NP  Ensure (ENSURE) Take 237 mLs by mouth 3 (three) times daily between meals.    [provider]  furosemide (LASIX) 40 MG tablet Take 1 tablet (40 mg total) by mouth daily. 01/17/21 09/09/23  Lacinda Axon, MD  gabapentin (NEURONTIN) 100 MG capsule Take 100 mg by mouth at bedtime.    [provider]  HYDROcodone-acetaminophen (NORCO/VICODIN) 5-325 MG tablet Take 1 tablet by mouth 3 (three) times daily as needed for moderate pain. Patient taking differently: Take 1 tablet by mouth every 4 (four) hours. 06/03/19   Arrien, Jimmy Picket, MD  levETIRAcetam (KEPPRA) 500 MG tablet Take 500 mg by mouth 2 (two) times daily.    [provider]  levothyroxine (SYNTHROID) 50 MCG tablet Take 50 mcg by mouth daily before breakfast. 07/13/15   [provider]  LORazepam (ATIVAN) 0.5 MG tablet Take 0.25 mg by mouth See admin instructions. 0.25mg  oral in the evening And 0.25mg  oral every 12 hours as needed for breakthrough anxiety    [provider]  omeprazole (PRILOSEC) 20 MG capsule Take 20 mg by mouth at bedtime.    [provider]  polyethylene glycol (MIRALAX / GLYCOLAX) 17 g packet Take 17 g by mouth daily. 06/03/19   Arrien,  Jimmy Picket, MD  rosuvastatin (CRESTOR) 20 MG tablet Take 1 tablet (20 mg total) by mouth daily. 09/12/22   August Albino, NP  SENNA PLUS 50-8.6 MG CAPS Take 1 capsule by mouth at bedtime. 12/20/20   [provider]  sertraline (ZOLOFT) 50 MG tablet Take 50 mg by mouth daily.    [provider]      VITAL SIGNS:  Blood pressure 113/85, pulse 95, temperature (!) 97.2 F (36.2 C), temperature source Oral, resp. rate (!) 24, height 5\' 4"  (1.626 m), weight 69.4 kg, SpO2 97 %.  PHYSICAL EXAMINATION:  Physical Exam  GENERAL:  88 y.o.-year-old Caucasian female patient lying in the bed with no acute distress.  EYES: Pupils equal, round, reactive to light and accommodation. No scleral icterus. Extraocular muscles intact.  HEENT: Head atraumatic, normocephalic. Oropharynx and nasopharynx clear.  NECK:  Supple, no jugular venous distention. No thyroid enlargement, no tenderness.  LUNGS: Normal breath sounds bilaterally, no wheezing, rales,rhonchi or crepitation. No use of accessory muscles of respiration.  CARDIOVASCULAR: Irregularly irregular tachycardic rhythm, S1, S2 normal. No murmurs, rubs, or gallops.  ABDOMEN: Soft, nondistended, nontender. Bowel sounds present. No organomegaly or mass.  EXTREMITIES: No pedal edema, cyanosis, or clubbing.  NEUROLOGIC: Cranial nerves II through XII are intact. Muscle strength 5/5 in all extremities. Sensation intact. Gait not checked.  PSYCHIATRIC: The patient is alert and oriented x 3.  Normal affect and good eye contact. SKIN: No obvious rash, lesion, or ulcer.   LABORATORY PANEL:   CBC Recent Labs  Lab 09/22/22 0208  WBC 9.6  HGB 12.1  HCT 35.1*  PLT 388   ------------------------------------------------------------------------------------------------------------------  Chemistries  Recent Labs  Lab 09/21/22 2212 09/22/22 0208  NA 135 134*  K 3.1* 3.0*  CL 100 98  CO2 24 26  GLUCOSE 121* 137*  BUN 15 16   CREATININE 1.07* 1.08*  CALCIUM 8.1* 8.1*  MG  --  1.9  AST 21  --   ALT 13  --   ALKPHOS 62  --   BILITOT 0.7  --    ------------------------------------------------------------------------------------------------------------------  Cardiac Enzymes No results for input(s): "TROPONINI" in the last 168 hours. ------------------------------------------------------------------------------------------------------------------  RADIOLOGY:  CT Head Wo Contrast  Result Date: 09/21/2022 CLINICAL DATA:  Head trauma, minor (Age >= 65y). fall EXAM: CT HEAD WITHOUT CONTRAST TECHNIQUE: Contiguous axial images were obtained from the base of the skull through the vertex without intravenous contrast. RADIATION DOSE REDUCTION: This exam was performed according to the departmental dose-optimization program which includes automated exposure control, adjustment of the mA and/or kV according to patient size and/or use of iterative reconstruction technique. COMPARISON:  CT head 09/10/2019 BRAIN: BRAIN Cerebral ventricle sizes are concordant with the degree of cerebral volume loss. Patchy and confluent areas of decreased attenuation are noted throughout the deep and periventricular white matter of the cerebral hemispheres bilaterally, compatible with chronic microvascular ischemic disease. Chronic right occipital infarction. No evidence of large-territorial acute infarction. No parenchymal hemorrhage. No mass lesion. No extra-axial collection. No mass effect or midline shift. No hydrocephalus. Basilar cisterns are patent. Vascular: No hyperdense vessel. Skull: No acute fracture or focal lesion. Sinuses/Orbits: Paranasal sinuses and mastoid air cells are clear. Bilateral lens replacement. The orbits are unremarkable. Other: None. IMPRESSION: No acute intracranial abnormality. Electronically Signed   By: Iven Finn M.D.   On: 09/21/2022 22:55   DG Chest Portable 1 View  Result Date: 09/21/2022 CLINICAL DATA:   Level 2 trauma, fall. EXAM: PORTABLE CHEST 1 VIEW COMPARISON:  09/07/2022. FINDINGS: The heart is enlarged and the mediastinal contour is stable. There is atherosclerotic calcification of the aorta. Lung volumes are low. Mild interstitial prominence is present bilaterally and likely chronic. No consolidation, effusion, or pneumothorax. Mild apical pleural scarring is noted bilaterally. No acute osseous abnormality. IMPRESSION: No active disease. Electronically Signed   By: Brett Fairy M.D.   On: 09/21/2022 22:39      IMPRESSION AND PLAN:  Assessment and Plan: * Atrial fibrillation with rapid ventricular response ([place]) - The patient be admitted to a PCU bed. - Given borderline BP well we will place her on IV digoxin. - We will continue hydration with IV normal saline with improvement of her blood pressure we will utilize beta-blocker and calcium channel blocker therapy. - We will continue Eliquis. - Cardiology consult will be obtained. - I notified Mardene Celeste  Abagail Kitchens about the patient.  Fall at home, initial encounter - This is associated with head injury. - We will follow neurochecks every 4 hours for 24 hours. - We will monitor mental status. - PT consult will be obtained.  Hypokalemia - Potassium will be replaced and magnesium level will be checked.  Hypothyroidism - We will continue Synthroid.  Essential hypertension - Antihypertensives will be held off for borderline BP  Seizure disorder ([place]) - We will continue Keppra and Neurontin.  Anxiety and depression .-We will continue Zoloft and Ativan  Hyperlipidemia - We will continue statin therapy.  GERD (gastroesophageal reflux disease) - We will continue PPI therapy.   DVT prophylaxis: Lovenox.  Advanced Care Planning:  Code Status: The patient is DNR/DNI.   Family Communication:  The plan of care was discussed in details with the patient (and family). I answered all questions. The patient agreed to proceed with the above  mentioned plan. Further management will depend upon hospital course. Disposition Plan: Back to previous home environment Consults called: Cardiology. All the records are reviewed and case discussed with ED provider.  Status is: Inpatient   At the time of the admission, it appears that the appropriate admission status for this patient is inpatient.  This is judged to be reasonable and necessary in order to provide the required intensity of service to ensure the patient's safety given the presenting symptoms, physical exam findings and initial radiographic and laboratory data in the context of comorbid conditions.  The patient requires inpatient status due to high intensity of service, high risk of further deterioration and high frequency of surveillance required.  I certify that at the time of admission, it is my clinical judgment that the patient will require inpatient hospital care extending more than 2 midnights.                            Dispo: The patient is from: SNF              Anticipated d/c is to: SNF              Patient currently is not medically stable to d/c.              Difficult to place patient: No  Christel Mormon M.D on 09/22/2022 at 6:07 AM  [facility]   From 7 PM-7 AM, contact night-coverage [email or web address]  CC: Primary care physician; Patient, No Pcp Per

## 2022-09-22 NOTE — ED Notes (Signed)
Diaper changed completely full with odorous urine.  Pt is on bed pan for bowel movement at this time

## 2022-09-22 NOTE — Assessment & Plan Note (Signed)
-   This is associated with head injury. - We will follow neurochecks every 4 hours for 24 hours. - We will monitor mental status. - PT consult will be obtained.

## 2022-09-22 NOTE — Assessment & Plan Note (Addendum)
-   We will continue Keppra and Neurontin.

## 2022-09-22 NOTE — Consult Note (Addendum)
Cardiology Consultation   Patient ID: Raven Peterson MRN: [id number]; DOB: [birth year]  Admit date: 09/21/2022 Date of Consult: 09/22/2022  PCP:  Patient, No Pcp Per   [place] [facility] Cardiologist:  Minus Breeding, MD        Patient Profile:   Raven Peterson is a 88 y.o. female with a hx of HFpEF, CVA, HTN, HLD, paroxysmal atrial fibrillation, hypothyroidism, seizure disorder who is being seen 09/22/2022 for the evaluation of atrial fibrillation with RVR after a fall at SNF at the request of Dr. Sidney Ace.  History of Present Illness:   Raven Peterson is a 88 year old female with above noted medical history who presented to [facility] ED after a fall at her assisted living facility. Patient reportedly was getting out of bed and became acutely weak/collapsed causing her to fall back and hit her head on the floor. Patient is not sure what caused the fall but denies loss of consciousness.   In the ED, patient found with atrial fibrillation and RVR on ECG. Her BP was noted to be low at 99/57. Initial lab workup notable for electrolyte abnormalities including potassium of 3.1. Albumin low at 2.6. Patient received Lopressor 5mg  x2 and Metoprolol Tartrate 12.5mg  in the ED overnight with improved rates. She was also given IV K replacement and NS via bolus and then infusion for BP support.   Of note patient was recently discharged on 10/2 after admission secondary to CVA.  Patient had been observed to be unsteady at her SNF and then later in the evening found to have left gaze deviation with unresponsiveness.  Upon [facility] to [facility], CT/CTA demonstrated distal basilar occlusion and after discussion with family, IV tenecteplase was given.  Patient's stroke was felt to be secondary to atrial fibrillation for which she has not historically been on [place] due to reported frequent falls.  It seems like this was discontinued when patient was previously on hospice care.  MRI was notable for acute infarcts in the  right occipital lobe and right thalamus with likely hemorrhage in the right occipital infarct.  Patient had good neurological recovery during this admission though some residual left side deficits noted by PT. She was started on Eliquis 2 days following her discharge on 10/2.   Past Medical History:  Diagnosis Date   Acute heart failure with preserved ejection fraction (HFpEF) ([place]) 01/13/2021   Acute ischemic stroke ([place]) 10/02/2014   right cerebellar and left posterior temporal CVA: This was confirmed on MRI. We suspected an embolic source given the distribution on MRI. She has no history of atrial fib and no atrial fib was seen on telemetry. She was seen by neurology in consultation and underwent carotid US which showed mild right carotid stenosis (<50%) and moderate left carotid stenosis (50-69%). She was treated with aspiri   Acute on chronic heart failure with preserved ejection fraction (HFpEF) ([place])    CAP (community acquired pneumonia) 03/17/2015   Chronic back pain    "used to get shots in my back by a Pain Management doctor"   COVID-19    Dyspnea    GERD (gastroesophageal reflux disease)    Glaucoma    Hyperlipidemia    Hypertension    Hypothyroidism    Ileus ([place]) 05/30/2019   Inferior pubic ramus fracture, right, with routine healing, subsequent encounter    Pneumonia due to COVID-19 virus 12/10/2020   SBO (small bowel obstruction) ([place]) 05/30/2019   Seizure ([place]) 10/02/2014   Small bowel obstruction (  [place])    Thyroid disease    UTI from E.coli 2018   Vertebral compression fracture [facility])     Past Surgical History:  Procedure Laterality Date   CATARACT EXTRACTION, BILATERAL     CHOLECYSTECTOMY       Home Medications:  Prior to Admission medications   Medication Sig Start Date End Date Taking? Authorizing Provider  benzonatate (TESSALON) 100 MG capsule Take 100 mg by mouth 3 (three) times daily as needed for cough.   Yes [provider]  ELIQUIS 5 MG TABS tablet  Take 5 mg by mouth 2 (two) times daily. 09/18/22  Yes [provider]  Ensure (ENSURE) Take 237 mLs by mouth 3 (three) times daily between meals.   Yes [provider]  furosemide (LASIX) 40 MG tablet Take 1 tablet (40 mg total) by mouth daily. 01/17/21 09/09/23 Yes Lacinda Axon, MD  gabapentin (NEURONTIN) 100 MG capsule Take 100 mg by mouth at bedtime.   Yes [provider]  HYDROcodone-acetaminophen (NORCO/VICODIN) 5-325 MG tablet Take 1 tablet by mouth 3 (three) times daily as needed for moderate pain. Patient taking differently: Take 1 tablet by mouth every 4 (four) hours. 06/03/19  Yes Arrien, Jimmy Picket, MD  levETIRAcetam (KEPPRA) 500 MG tablet Take 500 mg by mouth 2 (two) times daily.   Yes [provider]  levothyroxine (SYNTHROID) 50 MCG tablet Take 50 mcg by mouth daily before breakfast. 07/13/15  Yes [provider]  LORazepam (ATIVAN) 0.5 MG tablet Take 0.25 mg by mouth See admin instructions. 0.25mg  oral in the evening And 0.25mg  oral every 12 hours as needed for breakthrough anxiety   Yes [provider]  omeprazole (PRILOSEC) 20 MG capsule Take 20 mg by mouth at bedtime.   Yes [provider]  polyethylene glycol (MIRALAX / GLYCOLAX) 17 g packet Take 17 g by mouth daily. 06/03/19  Yes Arrien, Jimmy Picket, MD  rosuvastatin (CRESTOR) 20 MG tablet Take 1 tablet (20 mg total) by mouth daily. 09/12/22  Yes August Albino, NP  SENNA PLUS 50-8.6 MG CAPS Take 1 capsule by mouth at bedtime. 12/20/20  Yes [provider]  sertraline (ZOLOFT) 50 MG tablet Take 50 mg by mouth daily.   Yes [provider]  aspirin EC 81 MG tablet Take 1 tablet (81 mg total) by mouth daily. Swallow whole. Patient not taking: Reported on 09/22/2022 09/12/22   August Albino, NP    Inpatient Medications: Scheduled Meds:  apixaban  5 mg Oral BID   feeding supplement  237 mL Oral TID BM   gabapentin  100 mg Oral QHS    levETIRAcetam  500 mg Oral BID   levothyroxine  50 mcg Oral Q0600   LORazepam  0.25 mg Oral QHS   metoprolol tartrate  25 mg Oral BID   pantoprazole  40 mg Oral Daily   polyethylene glycol  17 g Oral Daily   rosuvastatin  20 mg Oral Daily   senna-docusate  1 tablet Oral QHS   sertraline  50 mg Oral Daily   Continuous Infusions:  potassium chloride     PRN Meds: acetaminophen **OR** acetaminophen, LORazepam, magnesium hydroxide, ondansetron **OR** ondansetron (ZOFRAN) IV, traZODone  Allergies:    Allergies  Allergen Reactions   Cimetidine Palpitations and Other (See Comments)    "Allergic," per MAR   Naproxen Sodium Other (See Comments)    Affects glaucoma medication   Fluzone [Influenza Virus Vaccine] Other (See Comments)    "Allergic," per  MAR   Fosamax [Alendronate Sodium] Other (See Comments)    "Allergic," per MAR   Loratadine Other (See Comments)    "Allergic," per MAR   Iodinated Contrast Media Nausea Only    Social History:   Social History   Socioeconomic History   Marital status: Widowed    Spouse name: Not on file   Number of children: Not on file   Years of education: Not on file   Highest education level: 8th grade  Occupational History   Not on file  Tobacco Use   Smoking status: Never   Smokeless tobacco: Never  Vaping Use   Vaping Use: Never used  Substance and Sexual Activity   Alcohol use: No   Drug use: No   Sexual activity: Not on file  Other Topics Concern   Not on file  Social History Narrative   Right handed   She has eighth grade education.    She moved here from [place].    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   She does not smoke, use any alcohol or recreational drugs.    She is a widow.    She has 2 daughters and 2 sons.    She has been living in an assisted living facility in [place] since October 2017.    Social Determinants of Health   Financial Resource Strain: Not on file  Food Insecurity: Not on file   Transportation Needs: Not on file  Physical Activity: Not on file  Stress: Not on file  Social Connections: Not on file  Intimate Partner Violence: Not on file    Family History:    Family History  Problem Relation Age of Onset   Healthy Mother    Asthma Father      ROS:  Please see the history of present illness.   All other ROS reviewed and negative.     Physical Exam/Data:   Vitals:   09/22/22 0715 09/22/22 0730 09/22/22 0744 09/22/22 0810  BP: 102/69 92/68  98/62  Pulse: (!) 111 (!) 106  (!) 108  Resp: 20 (!) 26  (!) 28  Temp:   97.7 F (36.5 C)   TempSrc:   Axillary   SpO2: 91% 95%  98%  Weight:      Height:        Intake/Output Summary (Last 24 hours) at 09/22/2022 1133 Last data filed at 09/22/2022 0730 Gross per 24 hour  Intake 100 ml  Output --  Net 100 ml      09/21/2022   10:27 PM 09/07/2022    8:00 PM 01/17/2021    4:36 AM  Last 3 Weights  Weight (lbs) 153 lb 153 lb 141 lb 8.6 oz  Weight (kg) 69.4 kg 69.4 kg 64.2 kg     Body mass index is 26.26 kg/m.  General:  Well nourished, well developed, in no acute distress HEENT: normal Neck: no JVD Vascular: No carotid bruits; Distal pulses 2+ bilaterally Cardiac:  normal S1, S2; fast, irregularly irregular. No murmur Lungs:  clear to auscultation bilaterally, no wheezing, rhonchi or rales  Abd: soft, nontender, no hepatomegaly  Ext: no edema Musculoskeletal:  Bruising noted to right forearm. Mild global extremity weakness. Skin: warm and dry  Neuro:  CNs 2-12 intact, no focal abnormalities noted Psych:  Patient appears to be at reported baseline. Knows history of event leading to hospitalization but is not able to tell me where she currently is. Some of this confusion may be secondary  to significant hearing deficit.  EKG:  The EKG was personally reviewed and demonstrates:  Initial ED EKG with difficult rhythm to discern. P waves are apparent, possibly suggestive of MAT. Repeat more consistent with  afib, no discernable P waves.   Telemetry:  Telemetry was personally reviewed and demonstrates:  irregularly irregular tachycardic rhythm.  Relevant CV Studies:  09/08/2022 TTE  Sonographer Comments: Technically difficult study due to poor echo  windows, suboptimal parasternal window and suboptimal apical window.  IMPRESSIONS     1. Left ventricular ejection fraction, by estimation, is 60 to 65%. The  left ventricle has normal function. The left ventricle has no regional  wall motion abnormalities. There is severe left ventricular hypertrophy.  Left ventricular diastolic parameters   are indeterminate.   2. Right ventricular systolic function is normal. The right ventricular  size is normal.   3. The mitral valve is normal in structure. Mild mitral valve  regurgitation. No evidence of mitral stenosis. Severe mitral annular  calcification.   4. The aortic valve has an indeterminant number of cusps. Aortic valve  regurgitation is not visualized. Mild aortic valve stenosis.   5. The inferior vena cava is normal in size with greater than 50%  respiratory variability, suggesting right atrial pressure of 3 mmHg.   FINDINGS   Left Ventricle: Left ventricular ejection fraction, by estimation, is 60  to 65%. The left ventricle has normal function. The left ventricle has no  regional wall motion abnormalities. The left ventricular internal cavity  size was normal in size. There is   severe left ventricular hypertrophy. Left ventricular diastolic  parameters are indeterminate.   Right Ventricle: The right ventricular size is normal. Right ventricular  systolic function is normal.   Left Atrium: Left atrial size was normal in size.   Right Atrium: Right atrial size was normal in size.   Pericardium: There is no evidence of pericardial effusion.   Mitral Valve: The mitral valve is normal in structure. Severe mitral  annular calcification. Mild mitral valve regurgitation. No evidence  of  mitral valve stenosis. MV peak gradient, 6.6 mmHg. The mean mitral valve  gradient is 4.0 mmHg.   Tricuspid Valve: The tricuspid valve is normal in structure. Tricuspid  valve regurgitation is trivial. No evidence of tricuspid stenosis.   Aortic Valve: The aortic valve has an indeterminant number of cusps.  Aortic valve regurgitation is not visualized. Mild aortic stenosis is  present. Aortic valve mean gradient measures 13.5 mmHg. Aortic valve peak  gradient measures 20.8 mmHg. Aortic valve   area, by VTI measures 0.72 cm.   Pulmonic Valve: The pulmonic valve was not well visualized. Pulmonic valve  regurgitation is trivial. No evidence of pulmonic stenosis.   Aorta: The aortic root is normal in size and structure.   Venous: The inferior vena cava is normal in size with greater than 50%  respiratory variability, suggesting right atrial pressure of 3 mmHg.   IAS/Shunts: No atrial level shunt detected by color flow Doppler.   Laboratory Data:  High Sensitivity Troponin:  No results for input(s): "TROPONINIHS" in the last 720 hours.   Chemistry Recent Labs  Lab 09/21/22 2212 09/22/22 0208  NA 135 134*  K 3.1* 3.0*  CL 100 98  CO2 24 26  GLUCOSE 121* 137*  BUN 15 16  CREATININE 1.07* 1.08*  CALCIUM 8.1* 8.1*  MG  --  1.9  GFRNONAA 47* 47*  ANIONGAP 11 10    Recent Labs  Lab 09/21/22 2212  PROT 6.0*  ALBUMIN 2.6*  AST 21  ALT 13  ALKPHOS 62  BILITOT 0.7   Lipids No results for input(s): "CHOL", "TRIG", "HDL", "LABVLDL", "LDLCALC", "CHOLHDL" in the last 168 hours.  Hematology Recent Labs  Lab 09/21/22 2212 09/22/22 0208  WBC 8.1 9.6  RBC 3.81* 3.77*  HGB 12.4 12.1  HCT 36.2 35.1*  MCV 95.0 93.1  MCH 32.5 32.1  MCHC 34.3 34.5  RDW 12.2 12.1  PLT 382 388   Thyroid  Recent Labs  Lab 09/22/22 0208  TSH 4.509*    BNPNo results for input(s): "BNP", "PROBNP" in the last 168 hours.  DDimer No results for input(s): "DDIMER" in the last 168  hours.   Radiology/Studies:  CT Head Wo Contrast  Result Date: 09/21/2022 CLINICAL DATA:  Head trauma, minor (Age >= 65y). fall EXAM: CT HEAD WITHOUT CONTRAST TECHNIQUE: Contiguous axial images were obtained from the base of the skull through the vertex without intravenous contrast. RADIATION DOSE REDUCTION: This exam was performed according to the departmental dose-optimization program which includes automated exposure control, adjustment of the mA and/or kV according to patient size and/or use of iterative reconstruction technique. COMPARISON:  CT head 09/10/2019 BRAIN: BRAIN Cerebral ventricle sizes are concordant with the degree of cerebral volume loss. Patchy and confluent areas of decreased attenuation are noted throughout the deep and periventricular white matter of the cerebral hemispheres bilaterally, compatible with chronic microvascular ischemic disease. Chronic right occipital infarction. No evidence of large-territorial acute infarction. No parenchymal hemorrhage. No mass lesion. No extra-axial collection. No mass effect or midline shift. No hydrocephalus. Basilar cisterns are patent. Vascular: No hyperdense vessel. Skull: No acute fracture or focal lesion. Sinuses/Orbits: Paranasal sinuses and mastoid air cells are clear. Bilateral lens replacement. The orbits are unremarkable. Other: None. IMPRESSION: No acute intracranial abnormality. Electronically Signed   By: Iven Finn M.D.   On: 09/21/2022 22:55   DG Chest Portable 1 View  Result Date: 09/21/2022 CLINICAL DATA:  Level 2 trauma, fall. EXAM: PORTABLE CHEST 1 VIEW COMPARISON:  09/07/2022. FINDINGS: The heart is enlarged and the mediastinal contour is stable. There is atherosclerotic calcification of the aorta. Lung volumes are low. Mild interstitial prominence is present bilaterally and likely chronic. No consolidation, effusion, or pneumothorax. Mild apical pleural scarring is noted bilaterally. No acute osseous abnormality.  IMPRESSION: No active disease. Electronically Signed   By: Brett Fairy M.D.   On: 09/21/2022 22:39     Assessment and Plan:  LEYNA MEW is a 88 y.o. female with a hx of HFpEF, CVA, HTN, HLD, paroxysmal atrial fibrillation, hypothyroidism, seizure disorder who is being seen 09/22/2022 for the evaluation of atrial fibrillation with RVR after a fall at SNF at the request of Dr. Sidney Ace.  Afib with RVR Falls  Patient with fall at [facility] prompting ED visit. Irregularly irregular rhythm noted by ED provider, concerning for afib. Patient was reportedly taken off [facility] when she was placed on hospice care previously, however, Eliquis restarted after admission for CVA 08/2022. Patient is a limited historian, unclear how symptomatic she is with afib.   Recommend rate control, preferably with beta blockers. She would need at least 3 more weeks of uninterrupted [place] before rhythm management could even be considered. HBG stable at 12.1, head CT negative for focal bleed. Would continue Eliquis at this time though if frequent falls become an issue, would need to consider whether Eliquis bleed risk outweighs stroke prevention benefit. Would also consider evaluating need for both Ativan and  Norco given patient's fall risk and [place] use.  HFpEF HTN  Patient with history of HFpEF has received both a fluid bolus and infusion secondary to low BP. Appears relatively euvolemic. Would d/c fluid resuscitation at this time, patient now has improved BP and rates. Hold home Lasix 40mg  at this time, especially with K of 3.1. Consider modifying Lasix frequency to every other day given hypokalemia.  Hypokalemia  Patient found to be hypokalemic on admission, 3.1 -> 3.0. Continue replacement (prefer oral to IV given HFpEF). Hold lasix for now   Follow I/O, Cr, K         Risk Assessment/Risk Scores:        New York Heart Association (NYHA) Functional Class NYHA Class II (assessment difficult due to mobility  challenges)  CHA2DS2-VASc Score = 7   This indicates a 11.2% annual risk of stroke. The patient's score is based upon: CHF History: 1 HTN History: 1 Diabetes History: 0 Stroke History: 2 Vascular Disease History: 0 Age Score: 2 Gender Score: 1     For questions or updates, please contact [place] Please consult [email or web address] for contact info under    Signed, Lily Kocher, PA-C  09/22/2022 11:33 AM  Patient seen and examined   I agree with findings as noted above by Alain Honey PA  Pt is a 88 yo with hx of CVA, HFpEF, PAF, HTN  REcently discharged on 09/11/22 after CVA.  Rx lytics.   CVA felt to be due to afib   Sent home on Eliquis    Presents to ED after becoming weak, falling.   Hit her head    Currently pt does not remember, cannot relate any history.  She appears comfortable   Just got back from swallowing study  ON exam, Neck:  JVP is normal Lungs are relatively CTA Cardiac Irreg irreg   No s3    No signficant murmurs  Abd is benign Ext are without edema   Feet warm   K on admit was low   3.1/ 3    She is on lasix at home but no  K Reportedly can't swallow   Afib    I would pursue rate control for now    Fall today shows that anticoagulation is very risky   SHe needs more supervision       WOrk on rate control  [place]ine tele to define rates   2  Hx HFpEF.    Overall volume appears OK    Hold lasix for now  Watch clinically  May be able to d/c or at most use every other day (with K of some sort)  Will follow   Dorris Carnes MD

## 2022-09-22 NOTE — Assessment & Plan Note (Signed)
-   Potassium will be replaced and magnesium level will be checked. ?

## 2022-09-22 NOTE — Progress Notes (Signed)
OT Cancellation Note  Patient Details Name: Raven Peterson MRN: [id number] DOB: [birth year]   Cancelled Treatment:    Reason Eval/Treat Not Completed: Patient at procedure or test/ unavailable Per nursing, transport in pt room about to take off of unit. Will follow up for OT eval as schedule permits.  [name] 09/22/2022, 11:42 AM

## 2022-09-22 NOTE — ED Notes (Signed)
Pt reported her bottom hurt from the bed., she was repositioned and reported she felt much better.

## 2022-09-22 NOTE — Assessment & Plan Note (Signed)
-   Antihypertensives will be held off for borderline BP

## 2022-09-22 NOTE — Evaluation (Signed)
Clinical/Bedside Swallow Evaluation Patient Details  Name: Raven Peterson MRN: [id number] Date of Birth: 08/10/1922  Today's Date: 09/22/2022 Time: SLP Start Time (ACUTE ONLY): 0846 SLP Stop Time (ACUTE ONLY): 0910 SLP Time Calculation (min) (ACUTE ONLY): 24 min  Past Medical History:  Past Medical History:  Diagnosis Date   Acute heart failure with preserved ejection fraction (HFpEF) ([place]) 01/13/2021   Acute ischemic stroke ([place]) 10/02/2014   right cerebellar and left posterior temporal CVA: This was confirmed on MRI. We suspected an embolic source given the distribution on MRI. She has no history of atrial fib and no atrial fib was seen on telemetry. She was seen by neurology in consultation and underwent carotid US which showed mild right carotid stenosis (<50%) and moderate left carotid stenosis (50-69%). She was treated with aspiri   Acute on chronic heart failure with preserved ejection fraction (HFpEF) ([place])    CAP (community acquired pneumonia) 03/17/2015   Chronic back pain    "used to get shots in my back by a Pain Management doctor"   COVID-19    Dyspnea    GERD (gastroesophageal reflux disease)    Glaucoma    Hyperlipidemia    Hypertension    Hypothyroidism    Ileus ([place]) 05/30/2019   Inferior pubic ramus fracture, right, with routine healing, subsequent encounter    Pneumonia due to COVID-19 virus 12/10/2020   SBO (small bowel obstruction) ([place]) 05/30/2019   Seizure ([place]) 10/02/2014   Small bowel obstruction (H[facility])    Thyroid disease    UTI from E.coli 2018   Vertebral compression fracture ([facility])    Past Surgical History:  Past Surgical History:  Procedure Laterality Date   CATARACT EXTRACTION, BILATERAL     CHOLECYSTECTOMY     HPI:  Pt is  88 y.o. female who presented to ED with acute onset of fall with subsequent head injury.  CXR and Head CT (10/12) both neg for acute changes/abnormality. PMH: HFpEF, CVA, GERD, hypertension, dyslipidemia, paroxysmal atrial fibrillation  on Eliquis, hypothyroidism and seizure disorder.    Assessment / Plan / Recommendation  Clinical Impression  Pt presents with immediate and delayed signs of aspiration primarily with thin liquids and less consistent with regular textured solids. She was alert, pleasantly confused, and followed all commands for completion of oral mechanism examination, which was unremarkable. Despite several missing dentition, mastication appeared functional with regular solids, however diffiuse oral residuals remained, cleared with liquid wash x1. Despite cueing for small sips by straw/cup, delayed coughing became more immediate as trials progressed. Some mild belching/gurgling also raised concern for esophageal component. Discussed presentation with daughter via phone, who reported that pt has hx of intermittent difficulty with swallowing, but that she has not been coughing with liquids recently. Recommend Modified Barium Swallow Study to further assess swallow physiology given clinical presentation. Tentatively scheduled today in radiology for 11:30am. RN and daughter both expressed agreement with completion of testing. Pt may have few ice chips and small bites puree until then as requested.  SLP Visit Diagnosis: Dysphagia, unspecified (R13.10)    Aspiration Risk  Moderate aspiration risk    Diet Recommendation NPO (may have few ice chips or few bites puree PRN)   Liquid Administration via: Spoon Medication Administration:  (if needed crush in puree) Supervision: Patient able to self feed;Full supervision/cueing for compensatory strategies Compensations: Minimize environmental distractions;Small sips/bites;Slow rate Postural Changes: Seated upright at 90 degrees;Remain upright for at least 30 minutes after po intake    Other  Recommendations Oral  Care Recommendations: Oral care QID    Recommendations for follow up therapy are one component of a multi-disciplinary discharge planning process, led by the  attending physician.  Recommendations may be updated based on patient status, additional functional criteria and insurance authorization.  Follow up Recommendations Other (comment) (TBD)      Assistance Recommended at Discharge Frequent or constant Supervision/Assistance  Functional Status Assessment Patient has had a recent decline in their functional status and demonstrates the ability to make significant improvements in function in a reasonable and predictable amount of time.  Frequency and Duration min 2x/week  2 weeks       Prognosis Prognosis for Safe Diet Advancement: Good Barriers to Reach Goals: Cognitive deficits;Time post onset      Swallow Study   General Date of Onset: 09/21/22 HPI: Pt is  88 y.o. female who presented to ED with acute onset of fall with subsequent head injury.  CXR and Head CT (10/12) both neg for acute changes/abnormality. PMH: HFpEF, CVA, GERD, hypertension, dyslipidemia, paroxysmal atrial fibrillation on Eliquis, hypothyroidism and seizure disorder. Type of Study: Bedside Swallow Evaluation Previous Swallow Assessment: bedside swallow evals per daughter Diet Prior to this Study: NPO Temperature Spikes Noted: No Respiratory Status: Room air History of Recent Intubation: No Behavior/Cognition: Alert;Cooperative;Confused Oral Cavity Assessment: Within Functional Limits Oral Care Completed by SLP: No Oral Cavity - Dentition: Missing dentition Vision: Functional for self-feeding Self-Feeding Abilities: Able to feed self Patient Positioning: Upright in bed;Postural control adequate for testing Baseline Vocal Quality: Hoarse Volitional Cough: Strong Volitional Swallow: Able to elicit    Oral/Motor/Sensory Function Overall Oral Motor/Sensory Function: Within functional limits   Ice Chips Ice chips: Not tested   Thin Liquid Thin Liquid: Impaired Presentation: Spoon;Self Fed;Cup Oral Phase Functional Implications: Prolonged oral transit Pharyngeal   Phase Impairments: Suspected delayed Swallow;Cough - Immediate;Cough - Delayed;Wet Vocal Quality    Nectar Thick Nectar Thick Liquid: Not tested   Honey Thick Honey Thick Liquid: Not tested   Puree Puree: Within functional limits Presentation: Self Fed;Spoon   Solid     Solid: Impaired Presentation: Self Fed Oral Phase Functional Implications: Oral residue Pharyngeal Phase Impairments: Cough - Immediate        Ellwood Dense, MA, [place] Office Number: [phone]  Acie Fredrickson 09/22/2022,9:34 AM

## 2022-09-22 NOTE — ED Notes (Signed)
RN and tec repositioned pt after she c/o "my bottom hurts."  She expressed delight after warm blankets were added.  RN also placed yellow socks and high fall band on pt.

## 2022-09-22 NOTE — Assessment & Plan Note (Addendum)
-   The patient be admitted to a PCU bed. - Given borderline BP well we will place her on IV digoxin. - We will continue hydration with IV normal saline with improvement of her blood pressure we will utilize beta-blocker and calcium channel blocker therapy. - We will continue Eliquis. - Cardiology consult will be obtained. - I notified Gay Filler about the patient.

## 2022-09-22 NOTE — Assessment & Plan Note (Signed)
-   We will continue statin therapy. 

## 2022-09-22 NOTE — Progress Notes (Signed)
Modified Barium Swallow Progress Note  Patient Details  Name: Raven Peterson MRN: [id number] Date of Birth: 31-Oct-1922  Today's Date: 09/22/2022  Modified Barium Swallow completed.  Full report located under Chart Review in the Imaging Section.  Brief recommendations include the following:  Clinical Impression  Pt presents with oropharyngeal dysphagia marked by reduced bolus cohesion and AP transit, poor BOT retraction, reduced velopharyngeal closure, and delayed swallow initation which resulted in consistent flash penetration of thin liquids (PAS 2) with x1 instance of trace silent aspiration (PAS 8) when taking pill whole with thin liquids. Pt very impulsive with liquid intake despite cueing for small volumes, although even large volumes were consumed without incidence of aspiration in multiple attempts by cup/straw. Prolonged mastication and bolus formation evident with regular textures, improved with sips of liquid. Poor BOT retraction resulted in inconsistent epiglottic inversion and vallecular residue. Trace-mild oral and vallecular residuals were minimized/cleared with spontaneous secondary dry swallows or liquid washes. Poor velopharyngeal closure exhibited when attempting to swallow barium tablet with thin. Subsequent nasopharyngeal reflux of liquid occured. Recommend dys 3 (mechanical soft) diet, thin liquids with 1:1 supervision for all meals to monitor for slow rate, small bites/sips, alternation of bites with sips. SLP to f/u for tolerance.   Swallow Evaluation Recommendations       SLP Diet Recommendations: Dysphagia 3 (Mech soft) solids;Thin liquid   Liquid Administration via: Cup;Straw   Medication Administration: Crushed with puree   Supervision: Patient able to self feed;Full supervision/cueing for compensatory strategies   Compensations: Minimize environmental distractions;Slow rate;Small sips/bites;Follow solids with liquid   Postural Changes: Seated upright at 90  degrees   Oral Care Recommendations: Oral care BID         Ellwood Dense, [place], [place] Office Number: [phone]  Acie Fredrickson 09/22/2022,12:50 PM

## 2022-09-22 NOTE — Progress Notes (Signed)
ANTICOAGULATION CONSULT NOTE - Initial Consult  Pharmacy Consult for Eliquis Indication: atrial fibrillation  Allergies  Allergen Reactions   Cimetidine Palpitations and Other (See Comments)    "Allergic," per MAR   Naproxen Sodium Other (See Comments)    Affects glaucoma medication   Fluzone [Influenza Virus Vaccine] Other (See Comments)    "Allergic," per [facility]   Fosamax [Alendronate Sodium] Other (See Comments)    "Allergic," per MAR   Loratadine Other (See Comments)    "Allergic," per MAR   Iodinated Contrast Media Nausea Only    Patient Measurements: Height: 5\' 4"  (162.6 cm) Weight: 69.4 kg (153 lb) IBW/kg (Calculated) : 54.7  Vital Signs: Temp: 97.7 F (36.5 C) (10/13 0744) Temp Source: Axillary (10/13 0744) BP: 98/62 (10/13 0810) Pulse Rate: 108 (10/13 0810)  Labs: Recent Labs    09/21/22 2212 09/22/22 0208  HGB 12.4 12.1  HCT 36.2 35.1*  PLT 382 388  CREATININE 1.07* 1.08*    Estimated Creatinine Clearance: 28.5 mL/min (A) (by C-G formula based on SCr of 1.08 mg/dL (H)).   Medical History: Past Medical History:  Diagnosis Date   Acute heart failure with preserved ejection fraction (HFpEF) ([place]) 01/13/2021   Acute ischemic stroke ([place]) 10/02/2014   right cerebellar and left posterior temporal CVA: This was confirmed on MRI. We suspected an embolic source given the distribution on MRI. She has no history of atrial fib and no atrial fib was seen on telemetry. She was seen by neurology in consultation and underwent carotid US which showed mild right carotid stenosis (<50%) and moderate left carotid stenosis (50-69%). She was treated with aspiri   Acute on chronic heart failure with preserved ejection fraction (HFpEF) ([facility])    CAP (community acquired pneumonia) 03/17/2015   Chronic back pain    "used to get shots in my back by a Pain Management doctor"   COVID-19    Dyspnea    GERD (gastroesophageal reflux disease)    Glaucoma    Hyperlipidemia     Hypertension    Hypothyroidism    Ileus ([place]) 05/30/2019   Inferior pubic ramus fracture, right, with routine healing, subsequent encounter    Pneumonia due to COVID-19 virus 12/10/2020   SBO (small bowel obstruction) ([place]) 05/30/2019   Seizure ([place]) 10/02/2014   Small bowel obstruction ([facility])    Thyroid disease    UTI from E.coli 2018   Vertebral compression fracture ([facility])     Medications:  Eliquis 5mg  PO BID for afib  Assessment: 88yo F with PMH of PAF and CVA presenting to the ED for fall. Pharmacy has be consulted for re-initiation of her PTA Eliquis. H&H wnl, no noted s/s of bleeding.   Goal of Therapy:  Therapeutic anticoagulation  Monitor platelets by anticoagulation protocol: Yes   Plan:  Eliquis 5mg  PO BID  Monitor H&H, s/s of bleeding  Titus Dubin, PharmD PGY1 Pharmacy Resident 09/22/2022 10:22 AM

## 2022-09-22 NOTE — Assessment & Plan Note (Signed)
-   We will continue Zoloft and Ativan. 

## 2022-09-22 NOTE — ED Notes (Signed)
MD notified of pt bp in the 89Q systolic and HR ranging from 105-135 bpm. Pt also reported nausea. Zofran IVP given. Will continue to monitor. No new orders at this time.

## 2022-09-22 NOTE — Progress Notes (Signed)
PROGRESS NOTE        PATIENT DETAILS Name: Raven Peterson Age: 88 y.o. Sex: female Date of Birth: 02/22/1922 Admit Date: 09/21/2022 Admitting Physician Christel Mormon, MD BP:[id number], No Pcp Per  Brief Summary: Patient is a 88 y.o.  female with history of recent embolic CVA in the setting of PAF (started on Eliquis)-who presented to the ED from SNF following a mechanical fall, she was found to have A-fib RVR and subsequently admitted to the hospitalist service.  Significant events: 9/28-10/2>> acute right PCA infarct-embolic-received tPA-started on Eliquis (not on Eliquis prior) 10/12>> admit to [facility]-mechanical fall-A-fib RVR  Significant studies: 10/12>> CXR: No PNA 10/12>> CT head: No acute intracranial abnormality.  Significant microbiology data: None  Procedures: None  Consults: Cardiology  Subjective: Lying comfortably in bed-denies any chest pain or shortness of breath.  Claims she never passed out when she fell-and that she fell because of incoordination.  Objective: Vitals: Blood pressure 98/62, pulse (!) 108, temperature 97.7 F (36.5 C), temperature source Axillary, resp. rate (!) 28, height 5\' 4"  (1.626 m), weight 69.4 kg, SpO2 98 %.   Exam: Gen Exam:Alert awake-not in any distress HEENT:atraumatic, normocephalic Chest: B/L clear to auscultation anteriorly CVS:S1S2 regular Abdomen:soft non tender, non distended Extremities:no edema Neurology: Moves all 4 extremities-has generalized weakness. Skin: no rash  Pertinent Labs/Radiology:    Latest Ref Rng & Units 09/22/2022    2:08 AM 09/21/2022   10:12 PM 09/11/2022    2:58 AM  CBC  WBC 4.0 - 10.5 K/uL 9.6  8.1  8.1   Hemoglobin 12.0 - 15.0 g/dL 12.1  12.4  13.8   Hematocrit 36.0 - 46.0 % 35.1  36.2  40.8   Platelets 150 - 400 K/uL 388  382  200     Lab Results  Component Value Date   NA 134 (L) 09/22/2022   K 3.0 (L) 09/22/2022   CL 98 09/22/2022   CO2 26 09/22/2022       Assessment/Plan: PAF with RVR Rate in the low 100s-seems to have improved Continue beta-blocker Resume Eliquis  Await cardiology input (discussed with stroke MD Dr. Delman Cheadle reviewed chart-recommended if family agreeable and patient in a supervised setting at ALF/SNF-okay to resume-subsequently discussed with daughter-[name]-she understands difficult situation-understands the risk of bleeding, catastrophic fall-leading to life-threatening/life disabling consequences-but elects to resume Eliquis)  Mechanical fall Denies syncope-claims this was due to incoordination Await PT/OT eval May need SNF on discharge (currently at ALF)  Recent right PCA infarct-s/p TNK Reviewed most recent discharge summary-plan was to start DOAC 2 days postdischarge-but per daughter-this never got started. See above discussion-Eliquis being started. On statin  Seizure disorder Continue Keppra/Neurontin  HTN BP soft-continue metoprolol  HLD Statin  GERD PPI  Anxiety/depression Stable-Zoloft/Ativan   BMI: Estimated body mass index is 26.26 kg/m as calculated from the following:   Height as of this encounter: 5\' 4"  (1.626 m).   Weight as of this encounter: 69.4 kg.   Code status:   Code Status: DNR   DVT Prophylaxis: Eliquis   Family Communication: Daughter [name] [phone] over the phone on 10/13   Disposition Plan: Status is: Inpatient Remains inpatient appropriate because: A-fib RVR-mechanical fall-needs PT/OT assessment-possible SNF on discharge.   Planned Discharge Destination:Skilled nursing facility vs ALF   Diet: Diet Order     None  Antimicrobial agents: Anti-infectives (From admission, onward)    None        MEDICATIONS: Scheduled Meds:  feeding supplement  237 mL Oral TID BM   gabapentin  100 mg Oral QHS   levETIRAcetam  500 mg Oral BID   levothyroxine  50 mcg Oral Q0600   LORazepam  0.25 mg Oral QHS   metoprolol tartrate   25 mg Oral BID   pantoprazole  40 mg Oral Daily   polyethylene glycol  17 g Oral Daily   rosuvastatin  20 mg Oral Daily   senna-docusate  1 tablet Oral QHS   sertraline  50 mg Oral Daily   Continuous Infusions:  sodium chloride 100 mL/hr at 09/22/22 0423   potassium chloride     PRN Meds:.acetaminophen **OR** acetaminophen, LORazepam, magnesium hydroxide, ondansetron **OR** ondansetron (ZOFRAN) IV, traZODone   I have personally reviewed following labs and imaging studies  LABORATORY DATA: CBC: Recent Labs  Lab 09/21/22 2212 09/22/22 0208  WBC 8.1 9.6  NEUTROABS 6.4  --   HGB 12.4 12.1  HCT 36.2 35.1*  MCV 95.0 93.1  PLT 382 [id number]    Basic Metabolic Panel: Recent Labs  Lab 09/21/22 2212 09/22/22 0208  NA 135 134*  K 3.1* 3.0*  CL 100 98  CO2 24 26  GLUCOSE 121* 137*  BUN 15 16  CREATININE 1.07* 1.08*  CALCIUM 8.1* 8.1*  MG  --  1.9    GFR: Estimated Creatinine Clearance: 28.5 mL/min (A) (by C-G formula based on SCr of 1.08 mg/dL (H)).  Liver Function Tests: Recent Labs  Lab 09/21/22 2212  AST 21  ALT 13  ALKPHOS 62  BILITOT 0.7  PROT 6.0*  ALBUMIN 2.6*   No results for input(s): "LIPASE", "AMYLASE" in the last 168 hours. No results for input(s): "AMMONIA" in the last 168 hours.  Coagulation Profile: No results for input(s): "INR", "PROTIME" in the last 168 hours.  Cardiac Enzymes: No results for input(s): "CKTOTAL", "CKMB", "CKMBINDEX", "TROPONINI" in the last 168 hours.  BNP (last 3 results) No results for input(s): "PROBNP" in the last 8760 hours.  Lipid Profile: No results for input(s): "CHOL", "HDL", "LDLCALC", "TRIG", "CHOLHDL", "LDLDIRECT" in the last 72 hours.  Thyroid Function Tests: Recent Labs    09/22/22 0208  TSH 4.509*    Anemia Panel: No results for input(s): "VITAMINB12", "FOLATE", "FERRITIN", "TIBC", "IRON", "RETICCTPCT" in the last 72 hours.  Urine analysis:    Component Value Date/Time   COLORURINE YELLOW  09/09/2022 1230   APPEARANCEUR HAZY (A) 09/09/2022 1230   LABSPEC 1.019 09/09/2022 1230   PHURINE 6.0 09/09/2022 1230   GLUCOSEU NEGATIVE 09/09/2022 1230   HGBUR NEGATIVE 09/09/2022 1230   BILIRUBINUR NEGATIVE 09/09/2022 1230   KETONESUR NEGATIVE 09/09/2022 1230   PROTEINUR NEGATIVE 09/09/2022 1230   NITRITE NEGATIVE 09/09/2022 1230   LEUKOCYTESUR MODERATE (A) 09/09/2022 1230    Sepsis Labs: Lactic Acid, Venous    Component Value Date/Time   LATICACIDVEN 1.3 10/19/2020 1929    MICROBIOLOGY: No results found for this or any previous visit (from the past 240 hour(s)).  RADIOLOGY STUDIES/RESULTS: CT Head Wo Contrast  Result Date: 09/21/2022 CLINICAL DATA:  Head trauma, minor (Age >= 65y). fall EXAM: CT HEAD WITHOUT CONTRAST TECHNIQUE: Contiguous axial images were obtained from the base of the skull through the vertex without intravenous contrast. RADIATION DOSE REDUCTION: This exam was performed according to the departmental dose-optimization program which includes automated exposure control, adjustment of the mA and/or kV according to  patient size and/or use of iterative reconstruction technique. COMPARISON:  CT head 09/10/2019 BRAIN: BRAIN Cerebral ventricle sizes are concordant with the degree of cerebral volume loss. Patchy and confluent areas of decreased attenuation are noted throughout the deep and periventricular white matter of the cerebral hemispheres bilaterally, compatible with chronic microvascular ischemic disease. Chronic right occipital infarction. No evidence of large-territorial acute infarction. No parenchymal hemorrhage. No mass lesion. No extra-axial collection. No mass effect or midline shift. No hydrocephalus. Basilar cisterns are patent. Vascular: No hyperdense vessel. Skull: No acute fracture or focal lesion. Sinuses/Orbits: Paranasal sinuses and mastoid air cells are clear. Bilateral lens replacement. The orbits are unremarkable. Other: None. IMPRESSION: No acute  intracranial abnormality. Electronically Signed   By: Iven Finn M.D.   On: 09/21/2022 22:55   DG Chest Portable 1 View  Result Date: 09/21/2022 CLINICAL DATA:  Level 2 trauma, fall. EXAM: PORTABLE CHEST 1 VIEW COMPARISON:  09/07/2022. FINDINGS: The heart is enlarged and the mediastinal contour is stable. There is atherosclerotic calcification of the aorta. Lung volumes are low. Mild interstitial prominence is present bilaterally and likely chronic. No consolidation, effusion, or pneumothorax. Mild apical pleural scarring is noted bilaterally. No acute osseous abnormality. IMPRESSION: No active disease. Electronically Signed   By: Brett Fairy M.D.   On: 09/21/2022 22:39     LOS: 0 days   Oren Binet, MD  [facility]    To contact the attending provider between 7A-7P or the covering provider during after hours 7P-7A, please log into the web site [email or web address] and access using universal [place] password for that web site. If you do not have the password, please call the hospital operator.  09/22/2022, 10:03 AM

## 2022-09-22 NOTE — Assessment & Plan Note (Addendum)
-   We will continue Synthroid. 

## 2022-09-23 DIAGNOSIS — W19XXXA Unspecified fall, initial encounter: Secondary | ICD-10-CM | POA: Diagnosis not present

## 2022-09-23 DIAGNOSIS — F419 Anxiety disorder, unspecified: Secondary | ICD-10-CM | POA: Diagnosis not present

## 2022-09-23 DIAGNOSIS — I4891 Unspecified atrial fibrillation: Secondary | ICD-10-CM | POA: Diagnosis not present

## 2022-09-23 DIAGNOSIS — I10 Essential (primary) hypertension: Secondary | ICD-10-CM | POA: Diagnosis not present

## 2022-09-23 LAB — BASIC METABOLIC PANEL
Anion gap: 15 (ref 5–15)
BUN: 24 mg/dL — ABNORMAL HIGH (ref 8–23)
CO2: 18 mmol/L — ABNORMAL LOW (ref 22–32)
Calcium: 8.2 mg/dL — ABNORMAL LOW (ref 8.9–10.3)
Chloride: 99 mmol/L (ref 98–111)
Creatinine, Ser: 1.46 mg/dL — ABNORMAL HIGH (ref 0.44–1.00)
GFR, Estimated: 33 mL/min — ABNORMAL LOW (ref >60)
Glucose, Bld: 117 mg/dL — ABNORMAL HIGH (ref 70–99)
Potassium: 3.9 mmol/L (ref 3.5–5.1)
Sodium: 132 mmol/L — ABNORMAL LOW (ref 135–145)

## 2022-09-23 LAB — MAGNESIUM: Magnesium: 2.5 mg/dL — ABNORMAL HIGH (ref 1.7–2.4)

## 2022-09-23 MED ORDER — LACTATED RINGERS IV BOLUS
500.0000 mL | Freq: Once | INTRAVENOUS | Status: AC
  Administered 2022-09-23: 500 mL via INTRAVENOUS

## 2022-09-23 NOTE — Progress Notes (Signed)
Rounding Note    Patient Name: Raven Peterson Date of Encounter: 09/23/2022  Raven Peterson Cardiologist: Minus Breeding, MD   Subjective   Patient is awake, in NAD   Not sure she understands discussion    Inpatient Medications    Scheduled Meds:  apixaban  5 mg Oral BID   feeding supplement  237 mL Oral TID BM   gabapentin  100 mg Oral QHS   levETIRAcetam  500 mg Oral BID   levothyroxine  50 mcg Oral Q0600   LORazepam  0.25 mg Oral QHS   metoprolol tartrate  25 mg Oral BID   pantoprazole  40 mg Oral Daily   polyethylene glycol  17 g Oral Daily   rosuvastatin  20 mg Oral Daily   senna-docusate  1 tablet Oral QHS   sertraline  50 mg Oral Daily   Continuous Infusions:  PRN Meds: acetaminophen **OR** acetaminophen, LORazepam, magnesium hydroxide, ondansetron **OR** ondansetron (ZOFRAN) IV, traZODone   Vital Signs    Vitals:   09/23/22 0111 09/23/22 0308 09/23/22 0400 09/23/22 0600  BP: (!) 85/57 (!) 90/54 93/65   Pulse: 89 (!) 107 (!) 106   Resp: 19 20 19 20   Temp: 97.7 F (36.5 C)  (!) 97.3 F (36.3 C) 97.6 F (36.4 C)  TempSrc: Axillary  Axillary Axillary  SpO2: 97% 98% 96%   Weight:      Height:        Intake/Output Summary (Last 24 hours) at 09/23/2022 0646 Last data filed at 09/22/2022 0730 Gross per 24 hour  Intake 100 ml  Output --  Net 100 ml      09/21/2022   10:27 PM 09/07/2022    8:00 PM 01/17/2021    4:36 AM  Last 3 Weights  Weight (lbs) 153 lb 153 lb 141 lb 8.6 oz  Weight (kg) 69.4 kg 69.4 kg 64.2 kg      Telemetry    Afib   90s to 100s  - Personally Reviewed  ECG    No new  - Personally Reviewed  Physical Exam   GEN: Frail 88 yo in No acute distress.   Neck: JVP is increased  Cardiac: Irreg irreg   No S3   Respiratory: Clear to auscultation bilaterally. GI: Soft, nontender, non-distended  MS: No edema; No deformity. Neuro:  Nonfocal  Psych: Normal affect   Labs    High Sensitivity Troponin:  No results for  input(s): "TROPONINIHS" in the last 720 hours.   Chemistry Recent Labs  Lab 09/21/22 2212 09/22/22 0208 09/23/22 0428  NA 135 134* 132*  K 3.1* 3.0* 3.9  CL 100 98 99  CO2 24 26 18*  GLUCOSE 121* 137* 117*  BUN 15 16 24*  CREATININE 1.07* 1.08* 1.46*  CALCIUM 8.1* 8.1* 8.2*  MG  --  1.9 2.5*  PROT 6.0*  --   --   ALBUMIN 2.6*  --   --   AST 21  --   --   ALT 13  --   --   ALKPHOS 62  --   --   BILITOT 0.7  --   --   GFRNONAA 47* 47* 33*  ANIONGAP 11 10 15     Lipids No results for input(s): "CHOL", "TRIG", "HDL", "LABVLDL", "LDLCALC", "CHOLHDL" in the last 168 hours.  Hematology Recent Labs  Lab 09/21/22 2212 09/22/22 0208  WBC 8.1 9.6  RBC 3.81* 3.77*  HGB 12.4 12.1  HCT 36.2 35.1*  MCV 95.0  93.1  MCH 32.5 32.1  MCHC 34.3 34.5  RDW 12.2 12.1  PLT 382 388   Thyroid  Recent Labs  Lab 09/22/22 0208  TSH 4.509*    BNPNo results for input(s): "BNP", "PROBNP" in the last 168 hours.  DDimer No results for input(s): "DDIMER" in the last 168 hours.   Radiology    DG Swallowing Func-Speech Pathology  Result Date: 09/22/2022 Table formatting from the original result was not included. Images from the original result were not included. Objective Swallowing Evaluation: Type of Study: MBS-Modified Barium Swallow Study  Patient Details Name: Raven Peterson MRN: [id number] Date of Birth: September 21, 1922 Today's Date: 09/22/2022 Time: SLP Start Time (ACUTE ONLY): 1150 -SLP Stop Time (ACUTE ONLY): 1204 SLP Time Calculation (min) (ACUTE ONLY): 14 min Past Medical History: Past Medical History: Diagnosis Date  Acute heart failure with preserved ejection fraction (HFpEF) ([place]) 01/13/2021  Acute ischemic stroke ([place]) 10/02/2014  right cerebellar and left posterior temporal CVA: This was confirmed on MRI. We suspected an embolic source given the distribution on MRI. She has no history of atrial fib and no atrial fib was seen on telemetry. She was seen by neurology in consultation and underwent  carotid US which showed mild right carotid stenosis (<50%) and moderate left carotid stenosis (50-69%). She was treated with aspiri  Acute on chronic heart failure with preserved ejection fraction (HFpEF) ([employer])   CAP (community acquired pneumonia) 03/17/2015  Chronic back pain   "used to get shots in my back by a Pain Management doctor"  COVID-19   Dyspnea   GERD (gastroesophageal reflux disease)   Glaucoma   Hyperlipidemia   Hypertension   Hypothyroidism   Ileus ([place]) 05/30/2019  Inferior pubic ramus fracture, right, with routine healing, subsequent encounter   Pneumonia due to COVID-19 virus 12/10/2020  SBO (small bowel obstruction) ([place]) 05/30/2019  Seizure ([place]) 10/02/2014  Small bowel obstruction ([facility])   Thyroid disease   UTI from E.coli 2018  Vertebral compression fracture ([facility])  Past Surgical History: Past Surgical History: Procedure Laterality Date  CATARACT EXTRACTION, BILATERAL    CHOLECYSTECTOMY   HPI: Pt is  88 y.o. female who presented to ED with acute onset of fall with subsequent head injury.  CXR and Head CT (10/12) both neg for acute changes/abnormality. PMH: HFpEF, CVA, GERD, hypertension, dyslipidemia, paroxysmal atrial fibrillation on Eliquis, hypothyroidism and seizure disorder.  No data recorded  Recommendations for follow up therapy are one component of a multi-disciplinary discharge planning process, led by the attending physician.  Recommendations may be updated based on patient status, additional functional criteria and insurance authorization. Assessment / Plan / Recommendation   09/22/2022  12:22 PM Clinical Impressions Clinical Impression Pt presents with oropharyngeal dysphagia marked by reduced bolus cohesion and AP transit, poor BOT retraction, reduced velopharyngeal closure, and delayed swallow initation which resulted in consistent flash penetration of thin liquids (PAS 2) with x1 instance of trace silent aspiration (PAS 8) when taking pill whole with thin liquids. Pt very impulsive  with liquid intake despite cueing for small volumes, although even large volumes were consumed without incidence of aspiration in multiple attempts by cup/straw. Prolonged mastication and bolus formation evident with regular textures, improved with sips of liquid. Poor BOT retraction resulted in inconsistent epiglottic inversion and vallecular residue. Trace-mild oral and vallecular residuals were minimized/cleared with spontaneous secondary dry swallows or liquid washes. Poor velopharyngeal closure exhibited when attempting to swallow barium tablet with thin. Subsequent nasopharyngeal reflux of liquid occured. Recommend dys 3 (  mechanical soft) diet, thin liquids with 1:1 supervision for all meals to monitor for slow rate, small bites/sips, alternation of bites with sips. SLP to f/u for tolerance. SLP Visit Diagnosis Dysphagia, oropharyngeal phase (R13.12) Impact on safety and function Mild aspiration risk     09/22/2022  12:22 PM Treatment Recommendations Treatment Recommendations Therapy as outlined in treatment plan below     09/22/2022  12:22 PM Prognosis Prognosis for Safe Diet Advancement Good Barriers to Reach Goals Cognitive deficits;Time post onset   09/22/2022  12:22 PM Diet Recommendations SLP Diet Recommendations Dysphagia 3 (Mech soft) solids;Thin liquid Liquid Administration via Cup;Straw Medication Administration Crushed with puree Compensations Minimize environmental distractions;Slow rate;Small sips/bites;Follow solids with liquid Postural Changes Seated upright at 90 degrees     09/22/2022  12:22 PM Other Recommendations Oral Care Recommendations Oral care BID Follow Up Recommendations Other (comment) Assistance recommended at discharge Frequent or constant Supervision/Assistance Functional Status Assessment Patient has had a recent decline in their functional status and demonstrates the ability to make significant improvements in function in a reasonable and predictable amount of time.    09/22/2022  12:22 PM Frequency and Duration  Speech Therapy Frequency (ACUTE ONLY) min 2x/week Treatment Duration 2 weeks     09/22/2022  12:22 PM Oral Phase Oral Phase Impaired Oral - Thin Cup Lingual pumping;Weak lingual manipulation;Reduced posterior propulsion;Lingual/palatal residue;Decreased bolus cohesion Oral - Thin Straw Lingual pumping;Weak lingual manipulation;Reduced posterior propulsion;Lingual/palatal residue;Decreased bolus cohesion;Decreased velopharyngeal closure Oral - Puree Lingual pumping;Weak lingual manipulation;Reduced posterior propulsion;Lingual/palatal residue;Decreased bolus cohesion Oral - Regular Lingual pumping;Weak lingual manipulation;Reduced posterior propulsion;Lingual/palatal residue;Decreased bolus cohesion;Impaired mastication Oral - Pill Reduced posterior propulsion;Decreased velopharyngeal closure    09/22/2022  12:22 PM Pharyngeal Phase Pharyngeal Phase Impaired Pharyngeal- Thin Cup Delayed swallow initiation-pyriform sinuses;Reduced epiglottic inversion;Reduced tongue base retraction;Penetration/Aspiration during swallow;Pharyngeal residue - valleculae;Nasopharyngeal reflux Pharyngeal Material enters airway, remains ABOVE vocal cords then ejected out Pharyngeal- Thin Straw Delayed swallow initiation-pyriform sinuses;Reduced epiglottic inversion;Reduced tongue base retraction;Penetration/Aspiration during swallow;Pharyngeal residue - valleculae;Trace aspiration;Nasopharyngeal reflux Pharyngeal Material enters airway, passes BELOW cords without attempt by patient to eject out (silent aspiration) Pharyngeal- Puree Reduced epiglottic inversion;Reduced tongue base retraction;Pharyngeal residue - valleculae Pharyngeal- Regular Reduced epiglottic inversion;Reduced tongue base retraction;Pharyngeal residue - valleculae     No data to display    Ellwood Dense, [place], [place] Office Number: [phone] Acie Fredrickson 09/22/2022, 1:36 PM                      CT Head Wo Contrast  Result Date: 09/21/2022 CLINICAL DATA:  Head trauma, minor (Age >= 65y). fall EXAM: CT HEAD WITHOUT CONTRAST TECHNIQUE: Contiguous axial images were obtained from the base of the skull through the vertex without intravenous contrast. RADIATION DOSE REDUCTION: This exam was performed according to the departmental dose-optimization program which includes automated exposure control, adjustment of the mA and/or kV according to patient size and/or use of iterative reconstruction technique. COMPARISON:  CT head 09/10/2019 BRAIN: BRAIN Cerebral ventricle sizes are concordant with the degree of cerebral volume loss. Patchy and confluent areas of decreased attenuation are noted throughout the deep and periventricular white matter of the cerebral hemispheres bilaterally, compatible with chronic microvascular ischemic disease. Chronic right occipital infarction. No evidence of large-territorial acute infarction. No parenchymal hemorrhage. No mass lesion. No extra-axial collection. No mass effect or midline shift. No hydrocephalus. Basilar cisterns are patent. Vascular: No hyperdense vessel. Skull: No acute fracture or focal lesion. Sinuses/Orbits: Paranasal sinuses and mastoid air cells  are clear. Bilateral lens replacement. The orbits are unremarkable. Other: None. IMPRESSION: No acute intracranial abnormality. Electronically Signed   By: Iven Finn M.D.   On: 09/21/2022 22:55   DG Chest Portable 1 View  Result Date: 09/21/2022 CLINICAL DATA:  Level 2 trauma, fall. EXAM: PORTABLE CHEST 1 VIEW COMPARISON:  09/07/2022. FINDINGS: The heart is enlarged and the mediastinal contour is stable. There is atherosclerotic calcification of the aorta. Lung volumes are low. Mild interstitial prominence is present bilaterally and likely chronic. No consolidation, effusion, or pneumothorax. Mild apical pleural scarring is noted bilaterally. No acute osseous abnormality. IMPRESSION: No active disease.  Electronically Signed   By: Brett Fairy M.D.   On: 09/21/2022 22:39    Cardiac Studies   Sept 2023  Echo      1. Left ventricular ejection fraction, by estimation, is 60 to 65%. The  left ventricle has normal function. The left ventricle has no regional  wall motion abnormalities. There is severe left ventricular hypertrophy.  Left ventricular diastolic parameters   are indeterminate.   2. Right ventricular systolic function is normal. The right ventricular  size is normal.   3. The mitral valve is normal in structure. Mild mitral valve  regurgitation. No evidence of mitral stenosis. Severe mitral annular  calcification.   4. The aortic valve has an indeterminant number of cusps. Aortic valve  regurgitation is not visualized. Mild aortic valve stenosis.   5. The inferior vena cava is normal in size with greater than 50%  respiratory variability, suggesting right atrial pressure of 3 mmHg.   Patient Profile     Raven Peterson is a 88 y.o. female with a hx of HFpEF, CVA, HTN, HLD, paroxysmal atrial fibrillation, hypothyroidism, seizure disorder who is being seen 09/22/2022 for the evaluation of atrial fibrillation with RVR after a fall at SNF at the request of Dr. Sidney Ace.  Assessment & Plan    1  Atrial fibrillatoin  Rates fair   She is in bed Note discussion of anticoagulation   Pt needs closer supervision if going to contine    BP will not allow med titration    2  Hx HFpEF  Volume not bad, probably up a little   Cr is up some today   Follow   Pt comfortable   Dose as need   3  Hx CVA  Recent in 9.2023   Not on anticoagulation at that time   4  HTN BP a little low   5  Hypokalemia   K corrected   We will sign off   Please call with questions.  For questions or updates, please contact [place] Please consult [email or web address] for contact info under        Signed, Dorris Carnes, MD  09/23/2022, 6:46 AM

## 2022-09-23 NOTE — Progress Notes (Signed)
   09/23/22 0400  Assess: MEWS Score  Temp (!) 97.3 F (36.3 C)  BP 93/65  MAP (mmHg) 72  Pulse Rate (!) 106  ECG Heart Rate (!) 109  Resp 19  SpO2 96 %  O2 Device Nasal Cannula  Patient Activity (if Appropriate) In bed  O2 Flow Rate (L/min) 2 L/min  Assess: MEWS Score  MEWS Temp 0  MEWS Systolic 1  MEWS Pulse 1  MEWS RR 0  MEWS LOC 0  MEWS Score 2  MEWS Score Color Yellow  Assess: if the MEWS score is Yellow or Red  Were vital signs taken at a resting state? Yes  Focused Assessment No change from prior assessment  Does the patient meet 2 or more of the SIRS criteria? No  MEWS guidelines implemented *See Row Information* No, previously yellow, continue vital signs every 4 hours  Assess: SIRS CRITERIA  SIRS Temperature  0  SIRS Pulse 1  SIRS Respirations  0  SIRS WBC 1  SIRS Score Sum  2

## 2022-09-23 NOTE — Plan of Care (Signed)
  Problem: Education: Goal: Knowledge of General Education information will improve Description: Including pain rating scale, medication(s)/side effects and non-pharmacologic comfort measures Outcome: Progressing   Problem: Activity: Goal: Risk for activity intolerance will decrease Outcome: Progressing   Problem: Nutrition: Goal: Adequate nutrition will be maintained Outcome: Progressing   Problem: Coping: Goal: Level of anxiety will decrease Outcome: Progressing   Problem: Elimination: Goal: Will not experience complications related to bowel motility Outcome: Progressing   Problem: Safety: Goal: Ability to remain free from injury will improve Outcome: Progressing   Problem: Skin Integrity: Goal: Risk for impaired skin integrity will decrease Outcome: Progressing   

## 2022-09-23 NOTE — Evaluation (Signed)
Physical Therapy Evaluation Patient Details Name: Raven Peterson MRN: [id number] DOB: 06/03/1922 Today's Date: 09/23/2022  History of Present Illness  Pt is a 88 y.o female presenting to [facility] ED on 09/23/2022 from [facility] on [address] where staff report pt had an unwitnessed mechanical fall. CT revealed no acute findings. Pt recently admitted 9/28-10/01/2022 with L sided deficits and distal basilar occlusion. 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  Clinical Impression  Patient presents with decreased mobility due to generalized weakness, decreased balance and decreased activity tolerance.  Per her daughter she was functioning even after her last stroke at ALF ambulatory to dining room with rollator and to bathroom. Though was slower and needing seated rest along the way.   She had declined therapy after her stroke as well and daughter indicates pt is a fiercely independent person.  However, upon entry today daughter was feeding pt and she needed +2 for up to EOB and she did not attempt self support, but did participate in feeding herself a couple bites prior to indicating she was finished and ready to lie back down.  Daughter seems to feel she is declining overall and is interested in Hospice services at her ALF.  PT will sign off and encourage mobility as tolerated with nursing staff for comfort.      Recommendations for follow up therapy are one component of a multi-disciplinary discharge planning process, led by the attending physician.  Recommendations may be updated based on patient status, additional functional criteria and insurance authorization.  Follow Up Recommendations No PT follow up      Assistance Recommended at Discharge Frequent or constant Supervision/Assistance  Patient can return home with the following  A lot of help with bathing/dressing/bathroom;Two people to help with  walking and/or transfers;Assistance with feeding    Equipment Recommendations None recommended by PT  Recommendations for Other Services       Functional Status Assessment Patient has had a recent decline in their functional status and/or demonstrates limited ability to make significant improvements in function in a reasonable and predictable amount of time     Precautions / Restrictions Precautions Precautions: Fall;Other (comment) Precaution Comments: HOH Restrictions Weight Bearing Restrictions: No      Mobility  Bed Mobility Overal bed mobility: Needs Assistance Bed Mobility: Rolling, Sit to Supine, Sit to Sidelying Rolling: Min assist   Supine to sit: Mod assist, +2 for physical assistance Sit to supine: +2 for physical assistance, Mod assist   General bed mobility comments: Assist for trunk elevation and LLE to come to EOB. Helicopter method to lie back down.    Transfers                   General transfer comment: deferred per pt wishes.    Ambulation/Gait                  Stairs            Wheelchair Mobility    Modified Rankin (Stroke Patients Only)       Balance Overall balance assessment: Needs assistance Sitting-balance support: Feet supported, No upper extremity supported Sitting balance-Leahy Scale: Poor Sitting balance - Comments: leaning back  Pertinent Vitals/Pain Pain Assessment Pain Assessment: Faces Faces Pain Scale: Hurts even more Pain Location: BLE Pain Descriptors / Indicators: Discomfort Pain Intervention(s): Repositioned, Monitored during session    Home Living Family/patient expects to be discharged to:: Assisted living                 Home Equipment: Rollator (4 wheels) Additional Comments: pt [facility] where she gets assist and uses rollator    Prior Function Prior Level of Function : Needs assist             Mobility Comments: uses  rollator ADLs Comments: Daughter reports staff assists with ADL     Hand Dominance   Dominant Hand: Right    Extremity/Trunk Assessment   Upper Extremity Assessment Upper Extremity Assessment: Defer to OT evaluation RUE Deficits / Details: Weak, and decreased dexterity but feel this is baseline LUE Deficits / Details: Decreased shoulder ROM, 4-/5 grip    Lower Extremity Assessment Lower Extremity Assessment: RLE deficits/detail;LLE deficits/detail RLE Deficits / Details: AROM WFL, strength grossly 3/5 LLE Deficits / Details: AAROM WFL, strength <3/5 recent CVA    Cervical / Trunk Assessment Cervical / Trunk Assessment: Kyphotic  Communication   Communication: HOH  Cognition Arousal/Alertness: Awake/alert Behavior During Therapy: WFL for tasks assessed/performed Overall Cognitive Status: Impaired/Different from baseline Area of Impairment: Attention, Memory, Following commands, Safety/judgement, Awareness, Problem solving                   Current Attention Level: Sustained Memory: Decreased Lozano-term memory Following Commands: Follows one step commands with increased time Safety/Judgement: Decreased awareness of deficits   Problem Solving: Slow processing, Decreased initiation, Requires verbal cues General Comments: Follows simple commands. Daughter reports pt would not like follow up therapies. Pt asking what therapist wants her to do. Very hard of hearing; difficult to fully assess cognition.        General Comments General comments (skin integrity, edema, etc.): HR max 123 at EOB, a-fib, rate in 100's and 110's mostly; sat EOB with support and OT encouraged some self feeding, pt fatigued.  Daughter in the room and feeding pt initially and giving all history    Exercises     Assessment/Plan    PT Assessment Patient does not need any further PT services  PT Problem List         PT Treatment Interventions      PT Goals (Current goals can be found in  the Care Plan section)  Acute Rehab PT Goals Patient Stated Goal: return to [facility] PT Goal Formulation: All assessment and education complete, DC therapy    Frequency       Co-evaluation PT/OT/SLP Co-Evaluation/Treatment: Yes Reason for Co-Treatment: Necessary to address cognition/behavior during functional activity;For patient/therapist safety;To address functional/ADL transfers PT goals addressed during session: Mobility/safety with mobility;Balance         AM-PAC PT "6 Clicks" Mobility  Outcome Measure Help needed turning from your back to your side while in a flat bed without using bedrails?: A Lot Help needed moving from lying on your back to sitting on the side of a flat bed without using bedrails?: Total Help needed moving to and from a bed to a chair (including a wheelchair)?: Total Help needed standing up from a chair using your arms (e.g., wheelchair or bedside chair)?: Total Help needed to walk in hospital room?: Total Help needed climbing 3-5 steps with a railing? : Total 6 Click Score: 7    End of Session  Activity Tolerance: Patient limited by fatigue Patient left: in bed;with call bell/phone within reach;with family/visitor present   PT Visit Diagnosis: Other abnormalities of gait and mobility (R26.89);Muscle weakness (generalized) (M62.81)    Time: 5188-4166 PT Time Calculation (min) (ACUTE ONLY): 24 min   Charges:   PT Evaluation $PT Eval Moderate Complexity: 1 Mod          Cyndi Maylon Sailors, PT Acute Rehabilitation Services Office:[phone] 09/23/2022   Reginia Naas 09/23/2022, 4:50 PM

## 2022-09-23 NOTE — Evaluation (Signed)
Occupational Therapy Evaluation Patient Details Name: Raven Peterson MRN: [id number] DOB: Jan 07, 1922 Today's Date: 09/23/2022   History of Present Illness Pt is a 88 y.o female presenting to [facility] ED on 09/23/2022 from [facility] on [address] where staff report pt had an unwitnessed mechanical fall. CT revealed no acute findings. Pt recently admitted 9/28-10/01/2022 with L sided deficits and distal basilar occlusion. 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   Clinical Impression   PTA, pt lived at [facility] where she received assistance with ADL. Upon eval, daughter reports they would like pt to return to [facility] with increased assist if possible. Daughter also interested in hospice care. Pt performing grooming with min A for sitting balance during session and bed mobility with mod-max A. Pt presents with decreased strength on L side, decreased activity tolerance, and decreased cognition. Pt daughter reports pt refused therapy when offered after recent admission with CVA and they do not want to pursue follow up therapies. OT will continue to follow acutely to optimize safety and independence in ADL in preparation for discharge.      Recommendations for follow up therapy are one component of a multi-disciplinary discharge planning process, led by the attending physician.  Recommendations may be updated based on patient status, additional functional criteria and insurance authorization.   Follow Up Recommendations  Other (comment) (Pt's daughter reports that pt is not interested in therapies. Recommend return to [facility] with additional assist)    Assistance Recommended at Discharge Frequent or constant Supervision/Assistance  Patient can return home with the following A little help with walking and/or transfers;A little help with bathing/dressing/bathroom;Assistance with  cooking/housework;Assistance with feeding;Direct supervision/assist for medications management;Direct supervision/assist for financial management;Assist for transportation;Help with stairs or ramp for entrance    Functional Status Assessment  Patient has had a recent decline in their functional status and/or demonstrates limited ability to make significant improvements in function in a reasonable and predictable amount of time  Equipment Recommendations  None recommended by OT    Recommendations for Other Services       Precautions / Restrictions Precautions Precautions: Fall;Other (comment) Precaution Comments: HOH Restrictions Weight Bearing Restrictions: No      Mobility Bed Mobility Overal bed mobility: Needs Assistance Bed Mobility: Rolling, Sit to Supine, Sit to Sidelying Rolling: Min assist   Supine to sit: Mod assist, +2 for physical assistance Sit to supine: +2 for physical assistance, Mod assist   General bed mobility comments: Assist for trunk elevation and LLE to come to EOB. Helicopter method to lie back down.    Transfers                   General transfer comment: deferred per pt wishes.      Balance Overall balance assessment: Needs assistance Sitting-balance support: Feet supported, No upper extremity supported Sitting balance-Leahy Scale: Poor Sitting balance - Comments: reliant on assist                                   ADL either performed or assessed with clinical judgement   ADL Overall ADL's : Needs assistance/impaired Eating/Feeding: Sitting;Moderate assistance Eating/Feeding Details (indicate cue type and reason): Needs positioning. Daughter feeding on arrival. Pt can self feed, however, does not eat much anymore, and easily fatigues. Grooming: Sitting;Minimal assistance Grooming Details (indicate cue  type and reason): Min A for sitting balance EOB Upper Body Bathing: Minimal assistance;Sitting   Lower Body Bathing:  Moderate assistance;Sitting/lateral leans   Upper Body Dressing : Minimal assistance;Sitting   Lower Body Dressing: Moderate assistance;Sit to/from Health and safety inspector Details (indicate cue type and reason): deferred this session per pt wishes   Toileting - Clothing Manipulation Details (indicate cue type and reason): Min A to roll off of bed pan.       General ADL Comments: limited by impaired cognition, generalized weakness, poor balance and activity tolerance     Vision Baseline Vision/History: 0 No visual deficits Vision Assessment?: No apparent visual deficits Additional Comments: denies visual changes     Perception     Praxis      Pertinent Vitals/Pain Pain Assessment Pain Assessment: Faces Faces Pain Scale: Hurts even more Pain Location: BLE Pain Descriptors / Indicators: Discomfort Pain Intervention(s): Limited activity within patient's tolerance, Monitored during session, Repositioned     Hand Dominance Right   Extremity/Trunk Assessment Upper Extremity Assessment Upper Extremity Assessment: Generalized weakness;LUE deficits/detail;RUE deficits/detail RUE Deficits / Details: Weak, and decreased dexterity but feel this is baseline LUE Deficits / Details: Decreased shoulder ROM, 4-/5 grip   Lower Extremity Assessment Lower Extremity Assessment: Defer to PT evaluation   Cervical / Trunk Assessment Cervical / Trunk Assessment: Kyphotic   Communication Communication Communication: HOH   Cognition Arousal/Alertness: Awake/alert Behavior During Therapy: WFL for tasks assessed/performed Overall Cognitive Status: Impaired/Different from baseline Area of Impairment: Attention, Memory, Following commands, Safety/judgement, Awareness, Problem solving                   Current Attention Level: Sustained Memory: Decreased Giammarino-term memory Following Commands: Follows one step commands with increased time Safety/Judgement: Decreased awareness of  deficits Awareness: Emergent Problem Solving: Slow processing, Decreased initiation, Requires verbal cues General Comments: Follows simple commands. Daughter reports pt would not like follow up therapies. Pt asking what therapist wants her to do. Very hard of hearing; difficult to fully assess cognition.     General Comments  HR elevating up to 123 at max sitting EOB    Exercises     Shoulder Instructions      Home Living Family/patient expects to be discharged to:: Assisted living                             Home Equipment: Rollator (4 wheels)   Additional Comments: pt from [facility] where she gets assist and uses rollator      Prior Functioning/Environment Prior Level of Function : Needs assist             Mobility Comments: uses rollator ADLs Comments: Daughter reports staff assists with ADL        OT Problem List: Decreased strength;Decreased range of motion;Decreased activity tolerance;Impaired balance (sitting and/or standing);Decreased cognition;Decreased safety awareness      OT Treatment/Interventions: Self-care/ADL training;Therapeutic exercise;DME and/or AE instruction;Therapeutic activities;Patient/family education;Balance training    OT Goals(Current goals can be found in the care plan section) Acute Rehab OT Goals Patient Stated Goal: lie down OT Goal Formulation: With patient Time For Goal Achievement: 10/07/22 Potential to Achieve Goals: Good  OT Frequency: Min 2X/week    Co-evaluation              AM-PAC OT "6 Clicks" Daily Activity     Outcome Measure Help from another person eating meals?: A Little Help from another  person taking care of personal grooming?: A Little Help from another person toileting, which includes using toliet, bedpan, or urinal?: A Lot Help from another person bathing (including washing, rinsing, drying)?: A Lot Help from another person to put on and taking off regular upper body clothing?: A Little Help  from another person to put on and taking off regular lower body clothing?: A Lot 6 Click Score: 15   End of Session Nurse Communication: Mobility status  Activity Tolerance: Patient tolerated treatment well Patient left: in bed;with call bell/phone within reach;with bed alarm set;with family/visitor present  OT Visit Diagnosis: Unsteadiness on feet (R26.81);Other abnormalities of gait and mobility (R26.89);Muscle weakness (generalized) (M62.81)                Time: 4166-0630 OT Time Calculation (min): 24 min Charges:  OT General Charges $OT Visit: 1 Visit OT Evaluation $OT Eval Moderate Complexity: 1 Mod  Ladene Artist, OTR/[facility] Acute Rehabilitation Office: [phone]   Drue Novel 09/23/2022, 3:46 PM

## 2022-09-23 NOTE — Progress Notes (Signed)
   09/23/22 0017  Assess: MEWS Score  Temp (!) 97.5 F (36.4 C)  BP (!) 80/60  Pulse Rate (!) 109  ECG Heart Rate (!) 109  Resp 19  Level of Consciousness Alert  SpO2 97 %  O2 Device Nasal Cannula  Patient Activity (if Appropriate) In bed  O2 Flow Rate (L/min) 2 L/min  Assess: MEWS Score  MEWS Temp 0  MEWS Systolic 2  MEWS Pulse 1  MEWS RR 0  MEWS LOC 0  MEWS Score 3  MEWS Score Color Yellow  Assess: if the MEWS score is Yellow or Red  Were vital signs taken at a resting state? Yes  Focused Assessment Change from prior assessment (see assessment flowsheet)  Does the patient meet 2 or more of the SIRS criteria? No  MEWS guidelines implemented *See Row Information* Yes  Treat  MEWS Interventions Other (Comment)  Notify: Provider  Provider Name/Title Dr.Chen  Date Provider Notified 09/23/22  Time Provider Notified 0010  Method of Notification Page  Notification Reason Other (Comment) (Low Blood Pressure)  Provider response See new orders  Date of Provider Response 09/23/22  Time of Provider Response 0015  Assess: SIRS CRITERIA  SIRS Temperature  0  SIRS Pulse 1  SIRS Respirations  0  SIRS WBC 1  SIRS Score Sum  2   Dr.Chen notified of low blood pressure.ordered 500 ml LR bolus.Will continue to monitor.

## 2022-09-23 NOTE — Plan of Care (Signed)

## 2022-09-23 NOTE — Progress Notes (Addendum)
PROGRESS NOTE        PATIENT DETAILS Name: Raven Peterson Age: 88 y.o. Sex: female Date of Birth: 1922-04-22 Admit Date: 09/21/2022 Admitting Physician [name], MD SWN:[id number], No Pcp Per  Brief Summary: Patient is a 88 y.o.  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  Significant events: 9/28-10/2>> acute right PCA infarct-embolic-received tPA-started on Eliquis (not on Eliquis prior) 10/12>> admit to [facility]-mechanical fall-A-fib RVR  Significant studies: 10/12>> CXR: No PNA 10/12>> CT head: No acute intracranial abnormality.  Significant microbiology data: None  Procedures: None  Consults: Cardiology  Subjective: No major issues overnight Remains in atrial fibrillation No chest pain or shortness of breath  Objective: Vitals: Blood pressure (!) 101/56, pulse 100, temperature 98.3 F (36.8 C), temperature source Oral, resp. rate 20, height 5\' 4"  (1.626 m), weight 69.4 kg, SpO2 97 %.   Exam: Gen Exam:Alert awake-not in any distress HEENT:atraumatic, normocephalic Chest: B/L clear to auscultation anteriorly CVS:S1S2 regular Abdomen:soft non tender, non distended Extremities:no edema Neurology: Moving all 4 extremities Skin: no rash   Pertinent Labs/Radiology:    Latest Ref Rng & Units 09/22/2022    2:08 AM 09/21/2022   10:12 PM 09/11/2022    2:58 AM  CBC  WBC 4.0 - 10.5 K/uL 9.6  8.1  8.1   Hemoglobin 12.0 - 15.0 g/dL 12.1  12.4  13.8   Hematocrit 36.0 - 46.0 % 35.1  36.2  40.8   Platelets 150 - 400 K/uL 388  382  200     Lab Results  Component Value Date   NA 132 (L) 09/23/2022   K 3.9 09/23/2022   CL 99 09/23/2022   CO2 18 (L) 09/23/2022      Assessment/Plan: PAF with RVR Rate relatively well  controlled-mostly in the 90s-low 100s  On Eliquis/beta-blocker  Appreciate cardiology input.  Rate in the low 100s-seems to have improved  (Note-discussed with stroke MD Dr. Leonie Man on 10/13-who reviewed chart-recommended if family agreeable and patient in a supervised setting at ALF/SNF-okay to resume-subsequently discussed with daughter-[name]-she understands difficult situation-understands the risk of bleeding, catastrophic fall-leading to life-threatening/life disabling consequences-but elects to resume Eliquis)  Mechanical fall Denies syncope-claims this was due to incoordination Await PT/OT eval May need SNF on discharge (currently at ALF)  AKI: Mild Encourage oral intake Avoid nephrotoxic agents Repeat electrolytes tomorrow If worsening-can contemplate further work-up  Recent right PCA infarct-s/p TNK Reviewed most recent discharge summary-plan was to start DOAC 2 days postdischarge-but per daughter-this never got started. Now on Eliquis (see above discussion regarding risk versus benefits) On statin  Seizure disorder Continue Keppra/Neurontin  HTN BP soft-continue metoprolol  HLD Statin  GERD PPI  Anxiety/depression Stable-Zoloft/Ativan   BMI: Estimated body mass index is 26.26 kg/m as calculated from the following:   Height as of this encounter: 5\' 4"  (1.626 m).   Weight as of this encounter: 69.4 kg.   Code status:   Code Status: DNR   DVT Prophylaxis: Eliquis apixaban (ELIQUIS) tablet 5 mg    Family Communication: Daughter [name] [phone] over the phone on 10/13   Disposition Plan: Status is: Inpatient Remains inpatient appropriate  because: A-fib RVR-mechanical fall-needs PT/OT assessment-possible SNF on discharge.   Planned Discharge Destination:Skilled nursing facility vs ALF   Diet: Diet Order             DIET DYS 3 Room service appropriate? No; Fluid consistency: Thin  Diet effective now                      Antimicrobial agents: Anti-infectives (From admission, onward)    None        MEDICATIONS: Scheduled Meds:  apixaban  5 mg Oral BID   feeding supplement  237 mL Oral TID BM   gabapentin  100 mg Oral QHS   levETIRAcetam  500 mg Oral BID   levothyroxine  50 mcg Oral Q0600   LORazepam  0.25 mg Oral QHS   metoprolol tartrate  25 mg Oral BID   pantoprazole  40 mg Oral Daily   polyethylene glycol  17 g Oral Daily   rosuvastatin  20 mg Oral Daily   senna-docusate  1 tablet Oral QHS   sertraline  50 mg Oral Daily   Continuous Infusions:   PRN Meds:.acetaminophen **OR** acetaminophen, LORazepam, magnesium hydroxide, ondansetron **OR** ondansetron (ZOFRAN) IV, traZODone   I have personally reviewed following labs and imaging studies  LABORATORY DATA: CBC: Recent Labs  Lab 09/21/22 2212 09/22/22 0208  WBC 8.1 9.6  NEUTROABS 6.4  --   HGB 12.4 12.1  HCT 36.2 35.1*  MCV 95.0 93.1  PLT 382 388     Basic Metabolic Panel: Recent Labs  Lab 09/21/22 2212 09/22/22 0208 09/23/22 0428  NA 135 134* 132*  K 3.1* 3.0* 3.9  CL 100 98 99  CO2 24 26 18*  GLUCOSE 121* 137* 117*  BUN 15 16 24*  CREATININE 1.07* 1.08* 1.46*  CALCIUM 8.1* 8.1* 8.2*  MG  --  1.9 2.5*     GFR: Estimated Creatinine Clearance: 21.1 mL/min (A) (by C-G formula based on SCr of 1.46 mg/dL (H)).  Liver Function Tests: Recent Labs  Lab 09/21/22 2212  AST 21  ALT 13  ALKPHOS 62  BILITOT 0.7  PROT 6.0*  ALBUMIN 2.6*    No results for input(s): "LIPASE", "AMYLASE" in the last 168 hours. No results for input(s): "AMMONIA" in the last 168 hours.  Coagulation Profile: No results for input(s): "INR", "PROTIME" in the last 168 hours.  Cardiac Enzymes: No results for input(s): "CKTOTAL", "CKMB", "CKMBINDEX", "TROPONINI" in the last 168 hours.  BNP (last 3 results) No results for input(s): "PROBNP" in the last 8760 hours.  Lipid Profile: No results for input(s): "CHOL", "HDL",  "LDLCALC", "TRIG", "CHOLHDL", "LDLDIRECT" in the last 72 hours.  Thyroid Function Tests: Recent Labs    09/22/22 0208  TSH 4.509*     Anemia Panel: No results for input(s): "VITAMINB12", "FOLATE", "FERRITIN", "TIBC", "IRON", "RETICCTPCT" in the last 72 hours.  Urine analysis:    Component Value Date/Time   COLORURINE YELLOW 09/09/2022 1230   APPEARANCEUR HAZY (A) 09/09/2022 1230   LABSPEC 1.019 09/09/2022 1230   PHURINE 6.0 09/09/2022 1230   GLUCOSEU NEGATIVE 09/09/2022 1230   HGBUR NEGATIVE 09/09/2022 1230   BILIRUBINUR NEGATIVE 09/09/2022 1230   KETONESUR NEGATIVE 09/09/2022 1230   PROTEINUR NEGATIVE 09/09/2022 1230   NITRITE NEGATIVE 09/09/2022 1230   LEUKOCYTESUR MODERATE (A) 09/09/2022 1230    Sepsis Labs: Lactic Acid, Venous    Component Value Date/Time   LATICACIDVEN 1.3 10/19/2020 1929    MICROBIOLOGY: No results found for this or  any previous visit (from the past 240 hour(s)).  RADIOLOGY STUDIES/RESULTS: DG Swallowing Func-Speech Pathology  Result Date: 09/22/2022 Table formatting from the original result was not included. Images from the original result were not included. Objective Swallowing Evaluation: Type of Study: MBS-Modified Barium Swallow Study  Patient Details Name: CHRYSTIN JEZIERSKI MRN: [id number] Date of Birth: [birth year] Today's Date: 09/22/2022 Time: SLP Start Time (ACUTE ONLY): 1150 -SLP Stop Time (ACUTE ONLY): 1204 SLP Time Calculation (min) (ACUTE ONLY): 14 min Past Medical History: Past Medical History: Diagnosis Date  Acute heart failure with preserved ejection fraction (HFpEF) ([place]) 01/13/2021  Acute ischemic stroke ([place]) 10/02/2014  right cerebellar and left posterior temporal CVA: This was confirmed on MRI. We suspected an embolic source given the distribution on MRI. She has no history of atrial fib and no atrial fib was seen on telemetry. She was seen by neurology in consultation and underwent carotid US which showed mild right carotid stenosis  (<50%) and moderate left carotid stenosis (50-69%). She was treated with aspiri  Acute on chronic heart failure with preserved ejection fraction (HFpEF) ([place])   CAP (community acquired pneumonia) 03/17/2015  Chronic back pain   "used to get shots in my back by a Pain Management doctor"  COVID-19   Dyspnea   GERD (gastroesophageal reflux disease)   Glaucoma   Hyperlipidemia   Hypertension   Hypothyroidism   Ileus ([place]) 05/30/2019  Inferior pubic ramus fracture, right, with routine healing, subsequent encounter   Pneumonia due to COVID-19 virus 12/10/2020  SBO (small bowel obstruction) ([place]) 05/30/2019  Seizure ([place]) 10/02/2014  Small bowel obstruction ([facility])   Thyroid disease   UTI from E.coli 2018  Vertebral compression fracture ([facility])  Past Surgical History: Past Surgical History: Procedure Laterality Date  CATARACT EXTRACTION, BILATERAL    CHOLECYSTECTOMY   HPI: Pt is  88 y.o. female who presented to ED with acute onset of fall with subsequent head injury.  CXR and Head CT (10/12) both neg for acute changes/abnormality. PMH: HFpEF, CVA, GERD, hypertension, dyslipidemia, paroxysmal atrial fibrillation on Eliquis, hypothyroidism and seizure disorder.  No data recorded  Recommendations for follow up therapy are one component of a multi-disciplinary discharge planning process, led by the attending physician.  Recommendations may be updated based on patient status, additional functional criteria and insurance authorization. Assessment / Plan / Recommendation   09/22/2022  12:22 PM Clinical Impressions Clinical Impression Pt presents with oropharyngeal dysphagia marked by reduced bolus cohesion and AP transit, poor BOT retraction, reduced velopharyngeal closure, and delayed swallow initation which resulted in consistent flash penetration of thin liquids (PAS 2) with x1 instance of trace silent aspiration (PAS 8) when taking pill whole with thin liquids. Pt very impulsive with liquid intake despite cueing for small volumes,  although even large volumes were consumed without incidence of aspiration in multiple attempts by cup/straw. Prolonged mastication and bolus formation evident with regular textures, improved with sips of liquid. Poor BOT retraction resulted in inconsistent epiglottic inversion and vallecular residue. Trace-mild oral and vallecular residuals were minimized/cleared with spontaneous secondary dry swallows or liquid washes. Poor velopharyngeal closure exhibited when attempting to swallow barium tablet with thin. Subsequent nasopharyngeal reflux of liquid occured. Recommend dys 3 (mechanical soft) diet, thin liquids with 1:1 supervision for all meals to monitor for slow rate, small bites/sips, alternation of bites with sips. SLP to f/u for tolerance. SLP Visit Diagnosis Dysphagia, oropharyngeal phase (R13.12) Impact on safety and function Mild aspiration risk     09/22/2022  12:22 PM  Treatment Recommendations Treatment Recommendations Therapy as outlined in treatment plan below     09/22/2022  12:22 PM Prognosis Prognosis for Safe Diet Advancement Good Barriers to Reach Goals Cognitive deficits;Time post onset   09/22/2022  12:22 PM Diet Recommendations SLP Diet Recommendations Dysphagia 3 (Mech soft) solids;Thin liquid Liquid Administration via Cup;Straw Medication Administration Crushed with puree Compensations Minimize environmental distractions;Slow rate;Small sips/bites;Follow solids with liquid Postural Changes Seated upright at 90 degrees     09/22/2022  12:22 PM Other Recommendations Oral Care Recommendations Oral care BID Follow Up Recommendations Other (comment) Assistance recommended at discharge Frequent or constant Supervision/Assistance Functional Status Assessment Patient has had a recent decline in their functional status and demonstrates the ability to make significant improvements in function in a reasonable and predictable amount of time.   09/22/2022  12:22 PM Frequency and Duration  Speech Therapy  Frequency (ACUTE ONLY) min 2x/week Treatment Duration 2 weeks     09/22/2022  12:22 PM Oral Phase Oral Phase Impaired Oral - Thin Cup Lingual pumping;Weak lingual manipulation;Reduced posterior propulsion;Lingual/palatal residue;Decreased bolus cohesion Oral - Thin Straw Lingual pumping;Weak lingual manipulation;Reduced posterior propulsion;Lingual/palatal residue;Decreased bolus cohesion;Decreased velopharyngeal closure Oral - Puree Lingual pumping;Weak lingual manipulation;Reduced posterior propulsion;Lingual/palatal residue;Decreased bolus cohesion Oral - Regular Lingual pumping;Weak lingual manipulation;Reduced posterior propulsion;Lingual/palatal residue;Decreased bolus cohesion;Impaired mastication Oral - Pill Reduced posterior propulsion;Decreased velopharyngeal closure    09/22/2022  12:22 PM Pharyngeal Phase Pharyngeal Phase Impaired Pharyngeal- Thin Cup Delayed swallow initiation-pyriform sinuses;Reduced epiglottic inversion;Reduced tongue base retraction;Penetration/Aspiration during swallow;Pharyngeal residue - valleculae;Nasopharyngeal reflux Pharyngeal Material enters airway, remains ABOVE vocal cords then ejected out Pharyngeal- Thin Straw Delayed swallow initiation-pyriform sinuses;Reduced epiglottic inversion;Reduced tongue base retraction;Penetration/Aspiration during swallow;Pharyngeal residue - valleculae;Trace aspiration;Nasopharyngeal reflux Pharyngeal Material enters airway, passes BELOW cords without attempt by patient to eject out (silent aspiration) Pharyngeal- Puree Reduced epiglottic inversion;Reduced tongue base retraction;Pharyngeal residue - valleculae Pharyngeal- Regular Reduced epiglottic inversion;Reduced tongue base retraction;Pharyngeal residue - valleculae     No data to display    [address], [place], [place] Office Number: [phone] Acie Fredrickson 09/22/2022, 1:36 PM                     CT Head Wo Contrast  Result Date:  09/21/2022 CLINICAL DATA:  Head trauma, minor (Age >= 65y). fall EXAM: CT HEAD WITHOUT CONTRAST TECHNIQUE: Contiguous axial images were obtained from the base of the skull through the vertex without intravenous contrast. RADIATION DOSE REDUCTION: This exam was performed according to the departmental dose-optimization program which includes automated exposure control, adjustment of the mA and/or kV according to patient size and/or use of iterative reconstruction technique. COMPARISON:  CT head 09/10/2019 BRAIN: BRAIN Cerebral ventricle sizes are concordant with the degree of cerebral volume loss. Patchy and confluent areas of decreased attenuation are noted throughout the deep and periventricular white matter of the cerebral hemispheres bilaterally, compatible with chronic microvascular ischemic disease. Chronic right occipital infarction. No evidence of large-territorial acute infarction. No parenchymal hemorrhage. No mass lesion. No extra-axial collection. No mass effect or midline shift. No hydrocephalus. Basilar cisterns are patent. Vascular: No hyperdense vessel. Skull: No acute fracture or focal lesion. Sinuses/Orbits: Paranasal sinuses and mastoid air cells are clear. Bilateral lens replacement. The orbits are unremarkable. Other: None. IMPRESSION: No acute intracranial abnormality. Electronically Signed   By: Iven Finn M.D.   On: 09/21/2022 22:55   DG Chest Portable 1 View  Result Date: 09/21/2022 CLINICAL DATA:  Level 2 trauma, fall. EXAM: PORTABLE CHEST  1 VIEW COMPARISON:  09/07/2022. FINDINGS: The heart is enlarged and the mediastinal contour is stable. There is atherosclerotic calcification of the aorta. Lung volumes are low. Mild interstitial prominence is present bilaterally and likely chronic. No consolidation, effusion, or pneumothorax. Mild apical pleural scarring is noted bilaterally. No acute osseous abnormality. IMPRESSION: No active disease. Electronically Signed   By: Brett Fairy  M.D.   On: 09/21/2022 22:39     LOS: 1 day   Oren Binet, MD  [facility]    To contact the attending provider between 7A-7P or the covering provider during after hours 7P-7A, please log into the web site [email or web address] and access using universal [place] password for that web site. If you do not have the password, please call the hospital operator.  09/23/2022, 9:57 AM

## 2022-09-24 DIAGNOSIS — I4891 Unspecified atrial fibrillation: Secondary | ICD-10-CM | POA: Diagnosis not present

## 2022-09-24 DIAGNOSIS — F419 Anxiety disorder, unspecified: Secondary | ICD-10-CM | POA: Diagnosis not present

## 2022-09-24 DIAGNOSIS — I10 Essential (primary) hypertension: Secondary | ICD-10-CM | POA: Diagnosis not present

## 2022-09-24 DIAGNOSIS — W19XXXA Unspecified fall, initial encounter: Secondary | ICD-10-CM | POA: Diagnosis not present

## 2022-09-24 LAB — BASIC METABOLIC PANEL
Anion gap: 12 (ref 5–15)
BUN: 32 mg/dL — ABNORMAL HIGH (ref 8–23)
CO2: 18 mmol/L — ABNORMAL LOW (ref 22–32)
Calcium: 8 mg/dL — ABNORMAL LOW (ref 8.9–10.3)
Chloride: 101 mmol/L (ref 98–111)
Creatinine, Ser: 1.59 mg/dL — ABNORMAL HIGH (ref 0.44–1.00)
GFR, Estimated: 29 mL/min — ABNORMAL LOW (ref >60)
Glucose, Bld: 120 mg/dL — ABNORMAL HIGH (ref 70–99)
Potassium: 4 mmol/L (ref 3.5–5.1)
Sodium: 131 mmol/L — ABNORMAL LOW (ref 135–145)

## 2022-09-24 MED ORDER — SODIUM CHLORIDE 0.9 % IV SOLN: INTRAVENOUS | Status: AC

## 2022-09-24 NOTE — Progress Notes (Signed)
PROGRESS NOTE        PATIENT DETAILS Name: Raven Peterson Age: 88 y.o. Sex: female Date of Birth: 04-06-1922 Admit Date: 09/21/2022 Admitting Physician [name], MD PXT:[id number], No Pcp Per  Brief Summary: Patient is a 88 y.o.  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  Significant events: 9/28-10/2>> acute right PCA infarct-embolic-received tPA-started on Eliquis (not on Eliquis prior) 10/12>> admit to [facility]-mechanical fall-A-fib RVR  Significant studies: 10/12>> CXR: No PNA 10/12>> CT head: No acute intracranial abnormality.  Significant microbiology data: None  Procedures: None  Consults: Cardiology  Subjective: Some mild intermittent confusion but no major issues overnight.  Objective: Vitals: Blood pressure (!) 115/43, pulse (!) 110, temperature 98.3 F (36.8 C), temperature source Oral, resp. rate 19, height 5\' 4"  (1.626 m), weight 69.4 kg, SpO2 95 %.   Exam: Gen Exam:Alert awake-not in any distress HEENT:atraumatic, normocephalic Chest: B/L clear to auscultation anteriorly CVS:S1S2 regular Abdomen:soft non tender, non distended Extremities:no edema Neurology: Non focal Skin: no rash   Pertinent Labs/Radiology:    Latest Ref Rng & Units 09/22/2022    2:08 AM 09/21/2022   10:12 PM 09/11/2022    2:58 AM  CBC  WBC 4.0 - 10.5 K/uL 9.6  8.1  8.1   Hemoglobin 12.0 - 15.0 g/dL 12.1  12.4  13.8   Hematocrit 36.0 - 46.0 % 35.1  36.2  40.8   Platelets 150 - 400 K/uL 388  382  200     Lab Results  Component Value Date   NA 131 (L) 09/24/2022   K 4.0 09/24/2022   CL 101 09/24/2022   CO2 18 (L) 09/24/2022      Assessment/Plan: PAF with RVR Rate relatively well controlled-remains in the 90s/low 100s at  times.   Continue Eliquis/beta-blocker.   Appreciate cardiology input.   (Note-discussed with stroke MD Dr. Leonie Man on 10/13-who reviewed chart-recommended if family agreeable and patient in a supervised setting at ALF/SNF-okay to resume-subsequently discussed with daughter-[name]-she understands difficult situation-understands the risk of bleeding, catastrophic fall-leading to life-threatening/life disabling consequences-but elects to resume Eliquis)  Mechanical fall Denies syncope-claims this was due to incoordination May need SNF on discharge (currently at ALF)  AKI: Mild although creatinine slightly worse Starting gentle IV hydration Continue to encourage oral intake Avoid nephrotoxic agents  Recent right PCA infarct-s/p TNK Reviewed most recent discharge summary-plan was to start DOAC 2 days postdischarge-but per daughter-this never got started. Now on Eliquis (see above discussion regarding risk versus benefits) On statin  Seizure disorder Continue Keppra/Neurontin  HTN BP soft-continue metoprolol  HLD Statin  GERD PPI  Anxiety/depression Stable-Zoloft/Ativan   BMI: Estimated body mass index is 26.26 kg/m as calculated from the following:   Height as of this encounter: 5\' 4"  (1.626 m).   Weight as of this encounter: 69.4 kg.   Code status:   Code Status: DNR   DVT Prophylaxis: Eliquis apixaban (ELIQUIS) tablet 5 mg    Family Communication: Daughter [name] [phone] voicemail on 10/15.   Disposition Plan: Status is: Inpatient Remains inpatient appropriate because: A-fib RVR-mechanical fall-needs PT/OT assessment-possible SNF on discharge.   Planned Discharge Destination:Skilled  nursing facility vs ALF   Diet: Diet Order             DIET DYS 3 Room service appropriate? No; Fluid consistency: Thin  Diet effective now                     Antimicrobial agents: Anti-infectives (From admission, onward)    None         MEDICATIONS: Scheduled Meds:  apixaban  5 mg Oral BID   feeding supplement  237 mL Oral TID BM   gabapentin  100 mg Oral QHS   levETIRAcetam  500 mg Oral BID   levothyroxine  50 mcg Oral Q0600   LORazepam  0.25 mg Oral QHS   metoprolol tartrate  25 mg Oral BID   pantoprazole  40 mg Oral Daily   polyethylene glycol  17 g Oral Daily   rosuvastatin  20 mg Oral Daily   senna-docusate  1 tablet Oral QHS   sertraline  50 mg Oral Daily   Continuous Infusions:   PRN Meds:.acetaminophen **OR** acetaminophen, LORazepam, magnesium hydroxide, ondansetron **OR** ondansetron (ZOFRAN) IV, traZODone   I have personally reviewed following labs and imaging studies  LABORATORY DATA: CBC: Recent Labs  Lab 09/21/22 2212 09/22/22 0208  WBC 8.1 9.6  NEUTROABS 6.4  --   HGB 12.4 12.1  HCT 36.2 35.1*  MCV 95.0 93.1  PLT 382 388     Basic Metabolic Panel: Recent Labs  Lab 09/21/22 2212 09/22/22 0208 09/23/22 0428 09/24/22 0347  NA 135 134* 132* 131*  K 3.1* 3.0* 3.9 4.0  CL 100 98 99 101  CO2 24 26 18* 18*  GLUCOSE 121* 137* 117* 120*  BUN 15 16 24* 32*  CREATININE 1.07* 1.08* 1.46* 1.59*  CALCIUM 8.1* 8.1* 8.2* 8.0*  MG  --  1.9 2.5*  --      GFR: Estimated Creatinine Clearance: 19.3 mL/min (A) (by C-G formula based on SCr of 1.59 mg/dL (H)).  Liver Function Tests: Recent Labs  Lab 09/21/22 2212  AST 21  ALT 13  ALKPHOS 62  BILITOT 0.7  PROT 6.0*  ALBUMIN 2.6*    No results for input(s): "LIPASE", "AMYLASE" in the last 168 hours. No results for input(s): "AMMONIA" in the last 168 hours.  Coagulation Profile: No results for input(s): "INR", "PROTIME" in the last 168 hours.  Cardiac Enzymes: No results for input(s): "CKTOTAL", "CKMB", "CKMBINDEX", "TROPONINI" in the last 168 hours.  BNP (last 3 results) No results for input(s): "PROBNP" in the last 8760 hours.  Lipid Profile: No results for input(s): "CHOL", "HDL", "LDLCALC", "TRIG", "CHOLHDL",  "LDLDIRECT" in the last 72 hours.  Thyroid Function Tests: Recent Labs    09/22/22 0208  TSH 4.509*     Anemia Panel: No results for input(s): "VITAMINB12", "FOLATE", "FERRITIN", "TIBC", "IRON", "RETICCTPCT" in the last 72 hours.  Urine analysis:    Component Value Date/Time   COLORURINE YELLOW 09/09/2022 1230   APPEARANCEUR HAZY (A) 09/09/2022 1230   LABSPEC 1.019 09/09/2022 1230   PHURINE 6.0 09/09/2022 1230   GLUCOSEU NEGATIVE 09/09/2022 1230   HGBUR NEGATIVE 09/09/2022 1230   BILIRUBINUR NEGATIVE 09/09/2022 1230   KETONESUR NEGATIVE 09/09/2022 1230   PROTEINUR NEGATIVE 09/09/2022 1230   NITRITE NEGATIVE 09/09/2022 1230   LEUKOCYTESUR MODERATE (A) 09/09/2022 1230    Sepsis Labs: Lactic Acid, Venous    Component Value Date/Time   LATICACIDVEN 1.3 10/19/2020 1929    MICROBIOLOGY: No results found for this or any  previous visit (from the past 240 hour(s)).  RADIOLOGY STUDIES/RESULTS: DG Swallowing Func-Speech Pathology  Result Date: 09/22/2022 Table formatting from the original result was not included. Images from the original result were not included. Objective Swallowing Evaluation: Type of Study: MBS-Modified Barium Swallow Study  Patient Details Name: Raven Peterson MRN: [id number] Date of Birth: 01-15-1922 Today's Date: 09/22/2022 Time: SLP Start Time (ACUTE ONLY): 1150 -SLP Stop Time (ACUTE ONLY): 1204 SLP Time Calculation (min) (ACUTE ONLY): 14 min Past Medical History: Past Medical History: Diagnosis Date  Acute heart failure with preserved ejection fraction (HFpEF) ([place]) 01/13/2021  Acute ischemic stroke ([place]) 10/02/2014  right cerebellar and left posterior temporal CVA: This was confirmed on MRI. We suspected an embolic source given the distribution on MRI. She has no history of atrial fib and no atrial fib was seen on telemetry. She was seen by neurology in consultation and underwent carotid US which showed mild right carotid stenosis (<50%) and moderate left carotid  stenosis (50-69%). She was treated with aspiri  Acute on chronic heart failure with preserved ejection fraction (HFpEF) ([place])   CAP (community acquired pneumonia) 03/17/2015  Chronic back pain   "used to get shots in my back by a Pain Management doctor"  COVID-19   Dyspnea   GERD (gastroesophageal reflux disease)   Glaucoma   Hyperlipidemia   Hypertension   Hypothyroidism   Ileus ([place]) 05/30/2019  Inferior pubic ramus fracture, right, with routine healing, subsequent encounter   Pneumonia due to COVID-19 virus 12/10/2020  SBO (small bowel obstruction) ([place]) 05/30/2019  Seizure ([place]) 10/02/2014  Small bowel obstruction ([facility])   Thyroid disease   UTI from E.coli 2018  Vertebral compression fracture ([facility])  Past Surgical History: Past Surgical History: Procedure Laterality Date  CATARACT EXTRACTION, BILATERAL    CHOLECYSTECTOMY   HPI: Pt is  88 y.o. female who presented to ED with acute onset of fall with subsequent head injury.  CXR and Head CT (10/12) both neg for acute changes/abnormality. PMH: HFpEF, CVA, GERD, hypertension, dyslipidemia, paroxysmal atrial fibrillation on Eliquis, hypothyroidism and seizure disorder.  No data recorded  Recommendations for follow up therapy are one component of a multi-disciplinary discharge planning process, led by the attending physician.  Recommendations may be updated based on patient status, additional functional criteria and insurance authorization. Assessment / Plan / Recommendation   09/22/2022  12:22 PM Clinical Impressions Clinical Impression Pt presents with oropharyngeal dysphagia marked by reduced bolus cohesion and AP transit, poor BOT retraction, reduced velopharyngeal closure, and delayed swallow initation which resulted in consistent flash penetration of thin liquids (PAS 2) with x1 instance of trace silent aspiration (PAS 8) when taking pill whole with thin liquids. Pt very impulsive with liquid intake despite cueing for small volumes, although even large volumes were  consumed without incidence of aspiration in multiple attempts by cup/straw. Prolonged mastication and bolus formation evident with regular textures, improved with sips of liquid. Poor BOT retraction resulted in inconsistent epiglottic inversion and vallecular residue. Trace-mild oral and vallecular residuals were minimized/cleared with spontaneous secondary dry swallows or liquid washes. Poor velopharyngeal closure exhibited when attempting to swallow barium tablet with thin. Subsequent nasopharyngeal reflux of liquid occured. Recommend dys 3 (mechanical soft) diet, thin liquids with 1:1 supervision for all meals to monitor for slow rate, small bites/sips, alternation of bites with sips. SLP to f/u for tolerance. SLP Visit Diagnosis Dysphagia, oropharyngeal phase (R13.12) Impact on safety and function Mild aspiration risk     09/22/2022  12:22 PM Treatment  Recommendations Treatment Recommendations Therapy as outlined in treatment plan below     09/22/2022  12:22 PM Prognosis Prognosis for Safe Diet Advancement Good Barriers to Reach Goals Cognitive deficits;Time post onset   09/22/2022  12:22 PM Diet Recommendations SLP Diet Recommendations Dysphagia 3 (Mech soft) solids;Thin liquid Liquid Administration via Cup;Straw Medication Administration Crushed with puree Compensations Minimize environmental distractions;Slow rate;Small sips/bites;Follow solids with liquid Postural Changes Seated upright at 90 degrees     09/22/2022  12:22 PM Other Recommendations Oral Care Recommendations Oral care BID Follow Up Recommendations Other (comment) Assistance recommended at discharge Frequent or constant Supervision/Assistance Functional Status Assessment Patient has had a recent decline in their functional status and demonstrates the ability to make significant improvements in function in a reasonable and predictable amount of time.   09/22/2022  12:22 PM Frequency and Duration  Speech Therapy Frequency (ACUTE ONLY) min 2x/week  Treatment Duration 2 weeks     09/22/2022  12:22 PM Oral Phase Oral Phase Impaired Oral - Thin Cup Lingual pumping;Weak lingual manipulation;Reduced posterior propulsion;Lingual/palatal residue;Decreased bolus cohesion Oral - Thin Straw Lingual pumping;Weak lingual manipulation;Reduced posterior propulsion;Lingual/palatal residue;Decreased bolus cohesion;Decreased velopharyngeal closure Oral - Puree Lingual pumping;Weak lingual manipulation;Reduced posterior propulsion;Lingual/palatal residue;Decreased bolus cohesion Oral - Regular Lingual pumping;Weak lingual manipulation;Reduced posterior propulsion;Lingual/palatal residue;Decreased bolus cohesion;Impaired mastication Oral - Pill Reduced posterior propulsion;Decreased velopharyngeal closure    09/22/2022  12:22 PM Pharyngeal Phase Pharyngeal Phase Impaired Pharyngeal- Thin Cup Delayed swallow initiation-pyriform sinuses;Reduced epiglottic inversion;Reduced tongue base retraction;Penetration/Aspiration during swallow;Pharyngeal residue - valleculae;Nasopharyngeal reflux Pharyngeal Material enters airway, remains ABOVE vocal cords then ejected out Pharyngeal- Thin Straw Delayed swallow initiation-pyriform sinuses;Reduced epiglottic inversion;Reduced tongue base retraction;Penetration/Aspiration during swallow;Pharyngeal residue - valleculae;Trace aspiration;Nasopharyngeal reflux Pharyngeal Material enters airway, passes BELOW cords without attempt by patient to eject out (silent aspiration) Pharyngeal- Puree Reduced epiglottic inversion;Reduced tongue base retraction;Pharyngeal residue - valleculae Pharyngeal- Regular Reduced epiglottic inversion;Reduced tongue base retraction;Pharyngeal residue - valleculae     No data to display    Ellwood Dense, [place], [place] Office Number: [phone] Acie Fredrickson 09/22/2022, 1:36 PM                       LOS: 2 days   Oren Binet, MD  [facility]    To contact the attending  provider between 7A-7P or the covering provider during after hours 7P-7A, please log into the web site [email or web address] and access using universal [place] password for that web site. If you do not have the password, please call the hospital operator.  09/24/2022, 10:27 AM

## 2022-09-24 NOTE — Plan of Care (Signed)
  Problem: Education: Goal: Knowledge of General Education information will improve Description: Including pain rating scale, medication(s)/side effects and non-pharmacologic comfort measures Outcome: Progressing   Problem: Clinical Measurements: Goal: Will remain free from infection Outcome: Progressing Goal: Respiratory complications will improve Outcome: Progressing   Problem: Activity: Goal: Risk for activity intolerance will decrease Outcome: Progressing   Problem: Nutrition: Goal: Adequate nutrition will be maintained Outcome: Progressing   Problem: Coping: Goal: Level of anxiety will decrease Outcome: Progressing   Problem: Elimination: Goal: Will not experience complications related to bowel motility Outcome: Progressing Goal: Will not experience complications related to urinary retention Outcome: Progressing   Problem: Safety: Goal: Ability to remain free from injury will improve Outcome: Progressing

## 2022-09-24 NOTE — Progress Notes (Signed)
   09/24/22 1941  Assess: MEWS Score  Temp 97.8 F (36.6 C)  BP (!) 88/66  MAP (mmHg) 76  Pulse Rate (!) 108  ECG Heart Rate (!) 107  Resp 20  SpO2 98 %  O2 Device Nasal Cannula  O2 Flow Rate (L/min) 2 L/min  Assess: MEWS Score  MEWS Temp 0  MEWS Systolic 1  MEWS Pulse 1  MEWS RR 0  MEWS LOC 0  MEWS Score 2  MEWS Score Color Yellow  Assess: if the MEWS score is Yellow or Red  Were vital signs taken at a resting state? Yes  Focused Assessment No change from prior assessment  Does the patient meet 2 or more of the SIRS criteria? No  MEWS guidelines implemented *See Row Information* No, previously yellow, continue vital signs every 4 hours  Treat  MEWS Interventions Other (Comment) (Attending MD is aware)  Pain Scale 0-10  Pain Score 0  Assess: SIRS CRITERIA  SIRS Temperature  0  SIRS Pulse 1  SIRS Respirations  0  SIRS WBC 1  SIRS Score Sum  2   Pt received with yellow MEWS.

## 2022-09-25 DIAGNOSIS — I4891 Unspecified atrial fibrillation: Secondary | ICD-10-CM | POA: Diagnosis not present

## 2022-09-25 DIAGNOSIS — F419 Anxiety disorder, unspecified: Secondary | ICD-10-CM | POA: Diagnosis not present

## 2022-09-25 DIAGNOSIS — W19XXXA Unspecified fall, initial encounter: Secondary | ICD-10-CM | POA: Diagnosis not present

## 2022-09-25 DIAGNOSIS — I10 Essential (primary) hypertension: Secondary | ICD-10-CM | POA: Diagnosis not present

## 2022-09-25 LAB — BASIC METABOLIC PANEL
Anion gap: 13 (ref 5–15)
BUN: 38 mg/dL — ABNORMAL HIGH (ref 8–23)
CO2: 17 mmol/L — ABNORMAL LOW (ref 22–32)
Calcium: 7.7 mg/dL — ABNORMAL LOW (ref 8.9–10.3)
Chloride: 99 mmol/L (ref 98–111)
Creatinine, Ser: 1.56 mg/dL — ABNORMAL HIGH (ref 0.44–1.00)
GFR, Estimated: 30 mL/min — ABNORMAL LOW (ref >60)
Glucose, Bld: 115 mg/dL — ABNORMAL HIGH (ref 70–99)
Potassium: 3.6 mmol/L (ref 3.5–5.1)
Sodium: 129 mmol/L — ABNORMAL LOW (ref 135–145)

## 2022-09-25 MED ORDER — APIXABAN 2.5 MG PO TABS
2.5000 mg | ORAL_TABLET | Freq: Two times a day (BID) | ORAL | Status: DC
  Administered 2022-09-25 – 2022-09-28 (×7): 2.5 mg via ORAL
  Filled 2022-09-25 (×7): qty 1

## 2022-09-25 MED ORDER — TRAMADOL HCL 50 MG PO TABS
25.0000 mg | ORAL_TABLET | Freq: Two times a day (BID) | ORAL | Status: DC | PRN
  Administered 2022-09-25: 25 mg via ORAL
  Filled 2022-09-25: qty 1

## 2022-09-25 NOTE — Discharge Instructions (Signed)

## 2022-09-25 NOTE — Care Management Important Message (Signed)
Important Message  Patient Details  Name: Raven Peterson MRN: [id number] Date of Birth: [birth year]   Medicare Important Message Given:  Yes     [name] Montine Circle 09/25/2022, 3:26 PM

## 2022-09-25 NOTE — Progress Notes (Signed)
ANTICOAGULATION CONSULT NOTE - Initial Consult  Pharmacy Consult for Eliquis Indication: atrial fibrillation  Allergies  Allergen Reactions   Cimetidine Palpitations and Other (See Comments)    "Allergic," per MAR   Naproxen Sodium Other (See Comments)    Affects glaucoma medication   Fluzone [Influenza Virus Vaccine] Other (See Comments)    "Allergic," per [facility]   Fosamax [Alendronate Sodium] Other (See Comments)    "Allergic," per MAR   Loratadine Other (See Comments)    "Allergic," per MAR   Iodinated Contrast Media Nausea Only    Patient Measurements: Height: 5\' 4"  (162.6 cm) Weight: 69.4 kg (153 lb) IBW/kg (Calculated) : 54.7  Vital Signs: Temp: 97.9 F (36.6 C) (10/16 0310) Temp Source: Axillary (10/16 0310) BP: 120/56 (10/16 0400) Pulse Rate: 97 (10/16 0400)  Labs: Recent Labs    09/23/22 0428 09/24/22 0347 09/25/22 0343  CREATININE 1.46* 1.59* 1.56*     Estimated Creatinine Clearance: 19.7 mL/min (A) (by C-G formula based on SCr of 1.56 mg/dL (H)).   Medical History: Past Medical History:  Diagnosis Date   Acute heart failure with preserved ejection fraction (HFpEF) ([place]) 01/13/2021   Acute ischemic stroke ([place]) 10/02/2014   right cerebellar and left posterior temporal CVA: This was confirmed on MRI. We suspected an embolic source given the distribution on MRI. She has no history of atrial fib and no atrial fib was seen on telemetry. She was seen by neurology in consultation and underwent carotid US which showed mild right carotid stenosis (<50%) and moderate left carotid stenosis (50-69%). She was treated with aspiri   Acute on chronic heart failure with preserved ejection fraction (HFpEF) ([facility])    CAP (community acquired pneumonia) 03/17/2015   Chronic back pain    "used to get shots in my back by a Pain Management doctor"   COVID-19    Dyspnea    GERD (gastroesophageal reflux disease)    Glaucoma    Hyperlipidemia    Hypertension    Hypothyroidism     Ileus ([place]) 05/30/2019   Inferior pubic ramus fracture, right, with routine healing, subsequent encounter    Pneumonia due to COVID-19 virus 12/10/2020   SBO (small bowel obstruction) ([place]) 05/30/2019   Seizure ([place]) 10/02/2014   Small bowel obstruction ([facility])    Thyroid disease    UTI from E.coli 2018   Vertebral compression fracture ([facility])     Medications:  Eliquis 5mg  PO BID for afib  Assessment: 88yo F with PMH of PAF and CVA presenting to the ED for fall. Pharmacy has be consulted for re-initiation of her PTA Eliquis. H&H wnl, no noted s/s of bleeding.   Patient's Scr now up to 1.56. As such, will decrease dose of apixaban to 2.5mg  BID for age >19 and Scr >1.5. If Scr improved at discharge, would increase back to home dose.   Goal of Therapy:  Therapeutic anticoagulation  Monitor platelets by anticoagulation protocol: Yes   Plan:  Decrease Eliquis to 2.5 mg PO BID  Monitor H&H, s/s of bleeding  Emlyn Maves A. Levada Dy, PharmD, BCPS, FNKF Clinical Pharmacist [place] Please utilize Amion for appropriate phone number to reach the unit pharmacist ([place])  09/25/2022 7:47 AM

## 2022-09-25 NOTE — Progress Notes (Signed)
PROGRESS NOTE        PATIENT DETAILS Name: Raven Peterson Age: 88 y.o. Sex: female Date of Birth: 05/29/1922 Admit Date: 09/21/2022 Admitting Physician [name], MD QP:[id number], No Pcp Per  Brief Summary: Patient is a 88 y.o.  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  Significant events: 9/28-10/2>> acute right PCA infarct-embolic-received tPA-started on Eliquis (not on Eliquis prior) 10/12>> admit to [facility]-mechanical fall-A-fib RVR  Significant studies: 10/12>> CXR: No PNA 10/12>> CT head: No acute intracranial abnormality.  Significant microbiology data: None  Procedures: None  Consults: Cardiology  Subjective: No major issues overnight.  Objective: Vitals: Blood pressure 106/77, pulse 69, temperature 97.6 F (36.4 C), temperature source Axillary, resp. rate 18, height 5\' 4"  (1.626 m), weight 69.4 kg, SpO2 94 %.   Exam: Gen Exam:Alert awake-not in any distress HEENT:atraumatic, normocephalic Chest: B/L clear to auscultation anteriorly CVS:S1S2 regular Abdomen:soft non tender, non distended Extremities:no edema Neurology: Non focal Skin: no rash   Pertinent Labs/Radiology:    Latest Ref Rng & Units 09/22/2022    2:08 AM 09/21/2022   10:12 PM 09/11/2022    2:58 AM  CBC  WBC 4.0 - 10.5 K/uL 9.6  8.1  8.1   Hemoglobin 12.0 - 15.0 g/dL 12.1  12.4  13.8   Hematocrit 36.0 - 46.0 % 35.1  36.2  40.8   Platelets 150 - 400 K/uL 388  382  200     Lab Results  Component Value Date   NA 129 (L) 09/25/2022   K 3.6 09/25/2022   CL 99 09/25/2022   CO2 17 (L) 09/25/2022      Assessment/Plan: PAF with RVR Rate relatively well controlled-remains in the 90s/low 100s at times.   Continue Eliquis/beta-blocker.    Appreciate cardiology input.   (Note-discussed with stroke MD Dr. Leonie Man on 10/13-who reviewed chart-recommended if family agreeable and patient in a supervised setting at ALF/SNF-okay to resume-subsequently discussed with daughter-[name]-she understands difficult situation-understands the risk of bleeding, catastrophic fall-leading to life-threatening/life disabling consequences-but elects to resume Eliquis)  Mechanical fall Denies syncope-claims this was due to incoordination May need SNF on discharge (currently at ALF)-see above discussion.  AKI: Mild-creatinine levels have plateaued  Encourage oral intake.   Avoid nephrotoxic agents.  Recent right PCA infarct-s/p TNK Reviewed most recent discharge summary-plan was to start DOAC 2 days postdischarge-but per daughter-this never got started. Now on Eliquis (see above discussion regarding risk versus benefits) On statin  Seizure disorder Continue Keppra/Neurontin  HTN BP soft-continue metoprolol  HLD Statin  GERD PPI  Anxiety/depression Stable-Zoloft/Ativan   BMI: Estimated body mass index is 26.26 kg/m as calculated from the following:   Height as of this encounter: 5\' 4"  (1.626 m).   Weight as of this encounter: 69.4 kg.   Code status:   Code Status: DNR   DVT Prophylaxis: apixaban (ELIQUIS) tablet 2.5 mg Start: 09/25/22 1000Eliquis apixaban (ELIQUIS) tablet 2.5 mg    Family Communication: Daughter [name] [phone] on 10/15 (had left voicemail but she called back).   Disposition Plan: Status is: Inpatient Remains inpatient appropriate because: A-fib RVR-mechanical fall-needs PT/OT assessment-possible SNF on discharge.  Planned Discharge Destination:Skilled nursing facility vs ALF.  Discussed with TOC team today-we will await further recommendations.   Diet: Diet Order             DIET DYS 3 Room service appropriate? No; Fluid consistency: Thin  Diet effective now                      Antimicrobial agents: Anti-infectives (From admission, onward)    None        MEDICATIONS: Scheduled Meds:  apixaban  2.5 mg Oral BID   feeding supplement  237 mL Oral TID BM   gabapentin  100 mg Oral QHS   levETIRAcetam  500 mg Oral BID   levothyroxine  50 mcg Oral Q0600   LORazepam  0.25 mg Oral QHS   metoprolol tartrate  25 mg Oral BID   pantoprazole  40 mg Oral Daily   polyethylene glycol  17 g Oral Daily   rosuvastatin  20 mg Oral Daily   senna-docusate  1 tablet Oral QHS   sertraline  50 mg Oral Daily   Continuous Infusions:   PRN Meds:.acetaminophen **OR** acetaminophen, LORazepam, magnesium hydroxide, ondansetron **OR** ondansetron (ZOFRAN) IV, traZODone   I have personally reviewed following labs and imaging studies  LABORATORY DATA: CBC: Recent Labs  Lab 09/21/22 2212 09/22/22 0208  WBC 8.1 9.6  NEUTROABS 6.4  --   HGB 12.4 12.1  HCT 36.2 35.1*  MCV 95.0 93.1  PLT 382 388     Basic Metabolic Panel: Recent Labs  Lab 09/21/22 2212 09/22/22 0208 09/23/22 0428 09/24/22 0347 09/25/22 0343  NA 135 134* 132* 131* 129*  K 3.1* 3.0* 3.9 4.0 3.6  CL 100 98 99 101 99  CO2 24 26 18* 18* 17*  GLUCOSE 121* 137* 117* 120* 115*  BUN 15 16 24* 32* 38*  CREATININE 1.07* 1.08* 1.46* 1.59* 1.56*  CALCIUM 8.1* 8.1* 8.2* 8.0* 7.7*  MG  --  1.9 2.5*  --   --      GFR: Estimated Creatinine Clearance: 19.7 mL/min (A) (by C-G formula based on SCr of 1.56 mg/dL (H)).  Liver Function Tests: Recent Labs  Lab 09/21/22 2212  AST 21  ALT 13  ALKPHOS 62  BILITOT 0.7  PROT 6.0*  ALBUMIN 2.6*    No results for input(s): "LIPASE", "AMYLASE" in the last 168 hours. No results for input(s): "AMMONIA" in the last 168 hours.  Coagulation Profile: No results for input(s): "INR", "PROTIME" in the last 168 hours.  Cardiac Enzymes: No results for input(s): "CKTOTAL", "CKMB", "CKMBINDEX", "TROPONINI" in the last 168 hours.  BNP (last 3 results) No results  for input(s): "PROBNP" in the last 8760 hours.  Lipid Profile: No results for input(s): "CHOL", "HDL", "LDLCALC", "TRIG", "CHOLHDL", "LDLDIRECT" in the last 72 hours.  Thyroid Function Tests: No results for input(s): "TSH", "T4TOTAL", "FREET4", "T3FREE", "THYROIDAB" in the last 72 hours.   Anemia Panel: No results for input(s): "VITAMINB12", "FOLATE", "FERRITIN", "TIBC", "IRON", "RETICCTPCT" in the last 72 hours.  Urine analysis:    Component Value Date/Time   COLORURINE YELLOW 09/09/2022 1230   APPEARANCEUR HAZY (A) 09/09/2022 1230   LABSPEC 1.019 09/09/2022 1230   PHURINE 6.0 09/09/2022 1230   GLUCOSEU NEGATIVE 09/09/2022 1230   HGBUR NEGATIVE 09/09/2022 1230   BILIRUBINUR NEGATIVE 09/09/2022 1230   KETONESUR NEGATIVE 09/09/2022 1230   PROTEINUR NEGATIVE 09/09/2022 1230   NITRITE NEGATIVE 09/09/2022 1230   LEUKOCYTESUR MODERATE (A) 09/09/2022 1230  Sepsis Labs: Lactic Acid, Venous    Component Value Date/Time   LATICACIDVEN 1.3 10/19/2020 1929    MICROBIOLOGY: No results found for this or any previous visit (from the past 240 hour(s)).  RADIOLOGY STUDIES/RESULTS: No results found.   LOS: 3 days   Oren Binet, MD  [facility]    To contact the attending provider between 7A-7P or the covering provider during after hours 7P-7A, please log into the web site [email or web address] and access using universal [place] password for that web site. If you do not have the password, please call the hospital operator.  09/25/2022, 10:51 AM

## 2022-09-25 NOTE — NC FL2 (Signed)
[place] LEVEL OF CARE SCREENING TOOL     IDENTIFICATION  Patient Name: Raven Peterson Birthdate: 03-23-1922 Sex: female Admission Date (Current Location): 09/21/2022  [facility] and [place] Number:  Herbalist and Address:  [facility], [place] [address] [place], [place] [address]      Provider Number: [id number]  Attending Physician Name and Address:  Jonetta Osgood, MD  Relative Name and Phone Number:       Current Level of Care: Hospital Recommended Level of Care: [place] Prior Approval Number:    Date Approved/Denied:   PASRR Number: [id number]  Discharge Plan: SNF    Current Diagnoses: Patient Active Problem List   Diagnosis Date Noted   Atrial fibrillation with rapid ventricular response ([place]) 09/22/2022   Anxiety and depression 09/22/2022   Fall at home, initial encounter 09/22/2022   Hypokalemia 09/22/2022   Atrial fibrillation with RVR ([place])    Fall    Atrial fibrillation ([place]) 09/11/2022   UTI (urinary tract infection) 09/11/2022   Acute right arterial ischemic stroke, PCA (posterior cerebral artery) ([place]) 09/07/2022   Palliative care encounter 02/14/2022   Chronic midline low back pain 02/14/2022   Chronic heart failure with preserved ejection fraction (HFpEF) ([place]) 02/14/2022   Pressure injury of skin 01/16/2021   Dyspnea 06/01/2019   Chronic anticoagulation 05/30/2019   Hypothyroidism 05/30/2019   Hyperlipidemia 05/30/2019   Atherosclerosis of abdominal aorta ([place]) 05/30/2019   Elevated troponin level 05/30/2019   Dependent on walker for ambulation 05/30/2019   Essential hypertension 10/02/2014   GERD (gastroesophageal reflux disease) 10/02/2014   Glaucoma 10/02/2014   Seizure disorder ([place]) 10/02/2014   History of stroke 05/29/2014    Orientation RESPIRATION BLADDER Height & Weight     Self, Place  O2 (2L nasal cannula) Incontinent, External catheter Weight: 153 lb (69.4  kg) Height:  5\' 4"  (162.6 cm)  BEHAVIORAL SYMPTOMS/MOOD NEUROLOGICAL BOWEL NUTRITION STATUS      Continent Diet (See dc summary)  AMBULATORY STATUS COMMUNICATION OF NEEDS Skin   Extensive Assist Verbally Normal                       Personal Care Assistance Level of Assistance  Bathing, Feeding, Dressing Bathing Assistance: Maximum assistance Feeding assistance: Limited assistance Dressing Assistance: Maximum assistance     Functional Limitations Info             SPECIAL CARE FACTORS FREQUENCY  PT (By licensed PT), OT (By licensed OT)     PT Frequency: 5x/week OT Frequency: 5x/week            Contractures Contractures Info: Not present    Additional Factors Info  Code Status, Allergies Code Status Info: DNR Allergies Info: Cimetidine, Naproxen Sodium, Fluzone (Influenza Virus Vaccine), Fosamax (Alendronate Sodium), Loratadine, Iodinated Contrast Media Psychotropic Info: Zoloft         Current Medications (09/25/2022):  This is the current hospital active medication list Current Facility-Administered Medications  Medication Dose Route Frequency Provider Last Rate Last Admin   acetaminophen (TYLENOL) tablet 650 mg  650 mg Oral Q6H PRN Mansy, Jan A, MD   650 mg at 09/24/22 1804   Or   acetaminophen (TYLENOL) suppository 650 mg  650 mg Rectal Q6H PRN Mansy, Jan A, MD       apixaban Arne [place]) tablet 2.5 mg  2.5 mg Oral BID Joselyn Glassman A, RPH   2.5 mg at 09/25/22 [phone]  feeding supplement (BOOST / RESOURCE BREEZE) liquid 1 Container  237 mL Oral TID BM Mansy, Arvella Merles, MD   1 Container at 09/25/22 0931   gabapentin (NEURONTIN) capsule 100 mg  100 mg Oral QHS Mansy, Jan A, MD   100 mg at 09/24/22 2049   levETIRAcetam (KEPPRA) tablet 500 mg  500 mg Oral BID Mansy, Jan A, MD   500 mg at 09/25/22 0931   levothyroxine (SYNTHROID) tablet 50 mcg  50 mcg Oral Q0600 Mansy, Jan A, MD   50 mcg at 09/25/22 0544   LORazepam (ATIVAN) tablet 0.25 mg  0.25 mg Oral QHS Mansy, Jan  A, MD   0.25 mg at 09/24/22 2050   LORazepam (ATIVAN) tablet 0.25 mg  0.25 mg Oral Q12H PRN Mansy, Jan A, MD   0.25 mg at 09/22/22 0810   magnesium hydroxide (MILK OF MAGNESIA) suspension 30 mL  30 mL Oral Daily PRN Mansy, Jan A, MD       metoprolol tartrate (LOPRESSOR) tablet 25 mg  25 mg Oral BID Jonetta Osgood, MD   25 mg at 09/25/22 0931   ondansetron (ZOFRAN) tablet 4 mg  4 mg Oral Q6H PRN Mansy, Jan A, MD       Or   ondansetron [facility]) injection 4 mg  4 mg Intravenous Q6H PRN Mansy, Jan A, MD   4 mg at 09/22/22 0738   pantoprazole (PROTONIX) EC tablet 40 mg  40 mg Oral Daily Mansy, Jan A, MD   40 mg at 09/25/22 0931   polyethylene glycol (MIRALAX / GLYCOLAX) packet 17 g  17 g Oral Daily Mansy, Jan A, MD   17 g at 09/25/22 0931   rosuvastatin (CRESTOR) tablet 20 mg  20 mg Oral Daily Mansy, Jan A, MD   20 mg at 09/25/22 0623   senna-docusate (Senokot-S) tablet 1 tablet  1 tablet Oral QHS Mansy, Jan A, MD   1 tablet at 09/24/22 2050   sertraline (ZOLOFT) tablet 50 mg  50 mg Oral Daily Mansy, Jan A, MD   50 mg at 09/25/22 0931   traZODone (DESYREL) tablet 25 mg  25 mg Oral QHS PRN Mansy, Jan A, MD   25 mg at 09/23/22 2107     Discharge Medications: Please see discharge summary for a list of discharge medications.  Relevant Imaging Results:  Relevant Lab Results:   Additional Information SS#: [id number]  Benard Halsted, LCSW

## 2022-09-25 NOTE — TOC Initial Note (Addendum)
Transition of Care [facility]) - Initial/Assessment Note    Patient Details  Name: Raven Peterson MRN: [id number] Date of Birth: 09-04-1922  [facility] CM/SW Contact:    Benard Halsted, LCSW Phone Number: 09/25/2022, 11:14 AM  Clinical Narrative:                 11am-CSW spoke with patient's daughter regarding MD's concern that patient may need increased supervision on Eliquis. She is not sure what [facility] can offer, but patient is paying about $6000/month to be there. CSW discussed SNF placement. She stated she has concerns about patient going to a SNF. CSW left message for Nate Civil engineer, contracting at [place]) to see if they can increase services. Daughter also stated that patient has had hospice before if they could be added at [facility] to increase the level of services.     11:14am-Per Nate, he feels that patient requires a stay at [facility] rehab before returning on Eliquis. CSW will complete referral.   1pm-CSW emailed SNF bed offers and ratings list to patient's daughter.     Barriers to Discharge:  (may need higher level of care)   Patient Goals and CMS Choice Patient states their goals for this hospitalization and ongoing recovery are:: Return to ALF CMS Medicare.gov Compare Post Acute Care list provided to:: Patient Represenative (must comment) Choice offered to / list presented to : Adult Children  Expected Discharge Plan and Services   In-house Referral: Clinical Social Work     Living arrangements for the past 2 months: [place]                                      Prior Living Arrangements/Services Living arrangements for the past 2 months: [place] Lives with:: Facility Resident Patient language and need for interpreter reviewed:: Yes Do you feel safe going back to the place where you live?: Yes      Need for Family Participation in Patient Care: Yes (Comment) Care giver support system in place?: Yes (comment)    Criminal Activity/Legal Involvement Pertinent to Current Situation/Hospitalization: No - Comment as needed  Activities of Daily Living Home Assistive Devices/Equipment: Gilford Rile (specify type) ADL Screening (condition at time of admission) Patient's cognitive ability adequate to safely complete daily activities?: Yes Is the patient deaf or have difficulty hearing?: No Does the patient have difficulty seeing, even when wearing glasses/contacts?: No Does the patient have difficulty concentrating, remembering, or making decisions?: Yes Patient able to express need for assistance with ADLs?: Yes Does the patient have difficulty dressing or bathing?: Yes Independently performs ADLs?: No Does the patient have difficulty walking or climbing stairs?: Yes Weakness of Legs: Both Weakness of Arms/Hands: None  Permission Sought/Granted Permission sought to share information with : Facility Sport and exercise psychologist, Family Supports Permission granted to share information with : No  Share Information with NAME: [name]     Permission granted to share info w Relationship: Daughter  Permission granted to share info w Contact Information: [phone]  Emotional Assessment Appearance:: Appears stated age Attitude/Demeanor/Rapport: Unable to Assess Affect (typically observed): Unable to Assess Orientation: : Oriented to Self, Oriented to Place Alcohol / Substance Use: Not Applicable Psych Involvement: No (comment)  Admission diagnosis:  Atrial fibrillation with rapid ventricular response ([place]) [I48.91] Atrial fibrillation with RVR ([facility]) [I48.91] Injury of head, initial encounter [S09.90XA] Fall, initial encounter [W19.XXXA] Patient Active Problem List  Diagnosis Date Noted   Atrial fibrillation with rapid ventricular response ([place]) 09/22/2022   Anxiety and depression 09/22/2022   Fall at home, initial encounter 09/22/2022   Hypokalemia 09/22/2022   Atrial fibrillation with RVR ([place])    Fall     Atrial fibrillation ([place]) 09/11/2022   UTI (urinary tract infection) 09/11/2022   Acute right arterial ischemic stroke, PCA (posterior cerebral artery) ([place]) 09/07/2022   Palliative care encounter 02/14/2022   Chronic midline low back pain 02/14/2022   Chronic heart failure with preserved ejection fraction (HFpEF) ([place]) 02/14/2022   Pressure injury of skin 01/16/2021   Dyspnea 06/01/2019   Chronic anticoagulation 05/30/2019   Hypothyroidism 05/30/2019   Hyperlipidemia 05/30/2019   Atherosclerosis of abdominal aorta ([place]) 05/30/2019   Elevated troponin level 05/30/2019   Dependent on walker for ambulation 05/30/2019   Essential hypertension 10/02/2014   GERD (gastroesophageal reflux disease) 10/02/2014   Glaucoma 10/02/2014   Seizure disorder ([place]) 10/02/2014   History of stroke 05/29/2014   PCP:  Patient, No Pcp Per Pharmacy:   [place], [place] - [address] [place] [address] Phone: [phone] Fax: [phone]  [facility] [address] [address] Phone: [phone] Fax: [phone]     Social Determinants of Health (SDOH) Interventions    Readmission Risk Interventions     No data to display

## 2022-09-26 DIAGNOSIS — W19XXXA Unspecified fall, initial encounter: Secondary | ICD-10-CM | POA: Diagnosis not present

## 2022-09-26 DIAGNOSIS — F419 Anxiety disorder, unspecified: Secondary | ICD-10-CM | POA: Diagnosis not present

## 2022-09-26 DIAGNOSIS — I4891 Unspecified atrial fibrillation: Secondary | ICD-10-CM | POA: Diagnosis not present

## 2022-09-26 DIAGNOSIS — I10 Essential (primary) hypertension: Secondary | ICD-10-CM | POA: Diagnosis not present

## 2022-09-26 MED ORDER — METOPROLOL TARTRATE 25 MG PO TABS: 25.0000 mg | ORAL_TABLET | Freq: Two times a day (BID) | ORAL | Status: AC

## 2022-09-26 MED ORDER — APIXABAN 2.5 MG PO TABS: 2.5000 mg | ORAL_TABLET | Freq: Two times a day (BID) | ORAL | Status: AC

## 2022-09-26 MED ORDER — LORAZEPAM 0.5 MG PO TABS: 0.2500 mg | ORAL_TABLET | ORAL | 0 refills | Status: AC

## 2022-09-26 MED ORDER — HYDROCODONE-ACETAMINOPHEN 5-325 MG PO TABS: 1.0000 | ORAL_TABLET | Freq: Three times a day (TID) | ORAL | 0 refills | Status: AC | PRN

## 2022-09-26 NOTE — Care Management (Signed)
Judithann Graves Appeal [id number]

## 2022-09-26 NOTE — Progress Notes (Signed)
Speech Language Pathology Treatment: Dysphagia  Patient Details Name: Raven Peterson MRN: [id number] DOB: 1922-02-10 Today's Date: 09/26/2022 Time: 5520-8022 SLP Time Calculation (min) (ACUTE ONLY): 16 min  Assessment / Plan / Recommendation Clinical Impression  Pt seen to address dysphagia goals.  She is upright in bed with partially empty breakfast tray at her side.  She does endorse "mild dull pain" pointing to epigastric region- denies wanting to call RN.    SLP reviewed pt's prior MBS with her and observed her with intake including OJ x4 ounces.  Swallow subjectively was timely without clinical indication of aspiration.  Pt did silently aspirate with thin when taking barium tablet - thus can not fully rule out aspiration.   She also demonstrates frequent eructation and per chart review is on Zofran and a PPI.  No nasal regurgitation noted - which pt states happens on occasion with liquids.   Per review of MBS flouro loops, pt appeared with prominent CP that did not impair barium flow - leading SLP to suspect her reflux may be contributing issue to her dysphagia.    Recommend continue diet with general precautions - she is xerostomic thus advise to start intake with liquids.  Also observed pt swallowing last sip of liquid with her lips opened - Advised she assure adequate lip seal present for oral propulsion strength - demonstrating and having pt return demonstration.  Created swallow precaution sign for pt to take to facility with her as she has dc order in place.  Thanks so much for allowing this SLP to help with pt's care plan.   HPI HPI: Pt is  88 y.o. female who presented to ED with acute onset of fall with subsequent head injury.  CXR and Head CT (10/12) both neg for acute changes/abnormality. PMH: HFpEF, CVA, GERD, hypertension, dyslipidemia, paroxysmal atrial fibrillation on Eliquis, hypothyroidism and seizure disorder.      SLP Plan  Continue with current plan of care       Recommendations for follow up therapy are one component of a multi-disciplinary discharge planning process, led by the attending physician.  Recommendations may be updated based on patient status, additional functional criteria and insurance authorization.    Recommendations  Diet recommendations: Thin liquid;Dysphagia 3 (mechanical soft) Liquids provided via: Cup;Straw Medication Administration:  (with puree - whole -) Compensations: Minimize environmental distractions;Slow rate;Small sips/bites;Follow solids with liquid Postural Changes and/or Swallow Maneuvers: Seated upright 90 degrees;Upright 30-60 min after meal                Oral Care Recommendations: Oral care QID Follow Up Recommendations: Skilled nursing-[facility] term rehab (<3 hours/day) Assistance recommended at discharge: Frequent or constant Supervision/Assistance SLP Visit Diagnosis: Dysphagia, oropharyngeal phase (R13.12) Plan: Continue with current plan of care        Raven Lime, MS [place] Office [phone] Pager [phone]    Raven Peterson  09/26/2022, 10:07 AM

## 2022-09-26 NOTE — Plan of Care (Signed)

## 2022-09-26 NOTE — Plan of Care (Signed)
  Problem: Education: Goal: Knowledge of General Education information will improve Description: Including pain rating scale, medication(s)/side effects and non-pharmacologic comfort measures Outcome: Progressing   Problem: Clinical Measurements: Goal: Will remain free from infection Outcome: Progressing Goal: Cardiovascular complication will be avoided Outcome: Progressing   Problem: Activity: Goal: Risk for activity intolerance will decrease Outcome: Progressing   Problem: Nutrition: Goal: Adequate nutrition will be maintained Outcome: Progressing   Problem: Coping: Goal: Level of anxiety will decrease Outcome: Progressing   Problem: Pain Managment: Goal: General experience of comfort will improve Outcome: Progressing   Problem: Skin Integrity: Goal: Risk for impaired skin integrity will decrease Outcome: Progressing

## 2022-09-26 NOTE — TOC Progression Note (Signed)
Transition of Care [facility]) - Progression Note    Patient Details  Name: Raven Peterson MRN: [id number] Date of Birth: 1922-02-25  Transition [facility]) CM/SW Contact  Orbie Pyo Phone Number: 09/26/2022, 3:12 PM  Judithann Graves Appeal   Detailed Notice of Discharge Document Given to Pateint: Yes K[employer] ROI Document Created: Yes Kepro appeal documents uploaded to Kepro stite: Yes

## 2022-09-26 NOTE — TOC Progression Note (Signed)
Transition of Care [facility]) - Progression Note    Patient Details  Name: Raven Peterson MRN: [id number] Date of Birth: May 24, 1922  Transition of Care [facility]) CM/SW [place], LCSW Phone Number: 09/26/2022, 11:58 AM  Clinical Narrative:    CSW received call from patient's daughter regarding discharge plan. She stated she received paperwork from Medicare stating they can appeal the discharge. She reported she does want to appeal but has to call CSW back to complete it since she is at work.     Barriers to Discharge:  (may need higher level of care)  Expected Discharge Plan and Services   In-house Referral: Clinical Social Work     Living arrangements for the past 2 months: [place] Expected Discharge Date: 09/26/22                                     Social Determinants of Health (SDOH) Interventions    Readmission Risk Interventions     No data to display

## 2022-09-26 NOTE — Discharge Summary (Signed)
PATIENT DETAILS Name: Raven Peterson Age: 88 y.o. Sex: female Date of Birth: 1921/12/27 MRN: [id number]. Admitting Physician: Jonetta Osgood, MD QP:[id number], No Pcp Per  Admit Date: 09/21/2022 Discharge date: 09/26/2022  Recommendations for Outpatient Follow-up:  Follow up with PCP in 1-2 weeks Please obtain CMP/CBC in one week Please ensure palliative follow-up at SNF Continue risks/benefits assessment of being on anticoagulation Ensure follow-up with cardiology/neurology  Admitted From:  ALF   Disposition: Skilled nursing facility   Discharge Condition: fair  CODE STATUS:   Code Status: DNR   Diet recommendation:  Diet Order             Diet - low sodium heart healthy           DIET DYS 3 Room service appropriate? No; Fluid consistency: Thin  Diet effective now                    Brief Summary: Patient is a 88 y.o.  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   Significant events: 9/28-10/2>> acute right PCA infarct-embolic-received tPA-started on Eliquis (not on Eliquis prior) 10/12>> admit to [facility]-mechanical fall-A-fib RVR   Significant studies: 10/12>> CXR: No PNA 10/12>> CT head: No acute intracranial abnormality.   Significant microbiology data: None   Procedures: None   Consults: Cardiology  Brief Hospital Course: PAF with RVR Rate controlled-continue beta-blocker Remains on Eliquis Ensure outpatient follow-up with cardiology   (Note-discussed with stroke MD Dr. Leonie Man on 10/13-who reviewed chart-recommended if family agreeable and patient in a supervised setting at ALF/SNF-okay to resume-subsequently discussed with daughter-Raven-she understands difficult situation-understands the risk of bleeding, catastrophic  fall-leading to life-threatening/life disabling consequences-but elects to resume Eliquis)   Mechanical fall Denies syncope-claims this was due to incoordination Plans are for SNF on discharge (currently at ALF)-see above discussion.   AKI: Mild-repeat electrolytes in a week or so Encourage oral intake.   Avoid nephrotoxic agents.  Hyponatremia Mild Asymptomatic Repeat electrolytes in 1 week   Recent right PCA infarct-s/p TNK Reviewed most recent discharge summary-plan was to start DOAC 2 days postdischarge-but per daughter-this never got started. Now on Eliquis (see above discussion regarding risk versus benefits) On statin   Seizure disorder Continue Keppra/Neurontin   HTN BP stable-continue metoprolol   HLD Statin   GERD PPI   Anxiety/depression Stable-Zoloft/Ativan  Palliative care DNR in place Elderly/frail lady with significant medical issues-not a candidate for aggressive care Please ensure follow-up with palliative care at SNF.  BMI: Estimated body mass index is 26.26 kg/m as calculated from the following:   Height as of this encounter: 5\' 4"  (1.626 m).   Weight as of this encounter: 69.4 kg.    Note--Raven Peterson [phone]  called on 10/17-left voicemail.  Discharge Diagnoses:  Principal Problem:   Atrial fibrillation with rapid ventricular response (HCC) Active Problems:   Fall at home, initial encounter   Hypokalemia   Hypothyroidism   Essential hypertension   Seizure disorder ([place])   GERD (gastroesophageal reflux disease)   Hyperlipidemia   Anxiety and depression   Atrial fibrillation with RVR [facility])   Discharge Instructions:  Activity:  As tolerated with Full fall precautions use walker/cane & assistance as needed  Discharge Instructions     Diet - low sodium  heart healthy   Complete by: As directed    Discharge instructions   Complete by: As directed    Follow with Primary MD  in 1-2 weeks  Please get a complete blood  count and chemistry panel checked by your Primary MD at your next visit, and again as instructed by your Primary MD.  Get Medicines reviewed and adjusted: Please take all your medications with you for your next visit with your Primary MD  Laboratory/radiological data: Please request your Primary MD to go over all hospital tests and procedure/radiological results at the follow up, please ask your Primary MD to get all Hospital records sent to his/her office.  In some cases, they will be blood work, cultures and biopsy results pending at the time of your discharge. Please request that your primary care M.D. follows up on these results.  Also Note the following: If you experience worsening of your admission symptoms, develop shortness of breath, life threatening emergency, suicidal or homicidal thoughts you must seek medical attention immediately by calling 911 or calling your MD immediately  if symptoms less severe.  You must read complete instructions/literature along with all the possible adverse reactions/side effects for all the Medicines you take and that have been prescribed to you. Take any new Medicines after you have completely understood and accpet all the possible adverse reactions/side effects.   Do not drive when taking Pain medications or sleeping medications (Benzodaizepines)  Do not take more than prescribed Pain, Sleep and Anxiety Medications. It is not advisable to combine anxiety,sleep and pain medications without talking with your primary care practitioner  Special Instructions: If you have smoked or chewed Tobacco  in the last 2 yrs please stop smoking, stop any regular Alcohol  and or any Recreational drug use.  Wear Seat belts while driving.  Please note: You were cared for by a hospitalist during your hospital stay. Once you are discharged, your primary care physician will handle any further medical issues. Please note that NO REFILLS for any discharge medications will  be authorized once you are discharged, as it is imperative that you return to your primary care physician (or establish a relationship with a primary care physician if you do not have one) for your post hospital discharge needs so that they can reassess your need for medications and monitor your lab values.   Increase activity slowly   Complete by: As directed       Allergies as of 09/26/2022       Reactions   Cimetidine Palpitations, Other (See Comments)   "Allergic," per MAR   Naproxen Sodium Other (See Comments)   Affects glaucoma medication   Fluzone [influenza Virus Vaccine] Other (See Comments)   "Allergic," per MAR   Fosamax [alendronate Sodium] Other (See Comments)   "Allergic," per MAR   Loratadine Other (See Comments)   "Allergic," per MAR   Iodinated Contrast Media Nausea Only        Medication List     STOP taking these medications    aspirin EC 81 MG tablet   furosemide 40 MG tablet Commonly known as: Lasix       TAKE these medications    apixaban 2.5 MG Tabs tablet Commonly known as: ELIQUIS Take 1 tablet (2.5 mg total) by mouth 2 (two) times daily. What changed:  medication strength how much to take   benzonatate 100 MG capsule Commonly known as: TESSALON Take 100 mg by mouth 3 (three) times daily as needed for cough.  Ensure Take 237 mLs by mouth 3 (three) times daily between meals.   gabapentin 100 MG capsule Commonly known as: NEURONTIN Take 100 mg by mouth at bedtime.   HYDROcodone-acetaminophen 5-325 MG tablet Commonly known as: NORCO/VICODIN Take 1 tablet by mouth 3 (three) times daily as needed for moderate pain. What changed:  when to take this reasons to take this   levETIRAcetam 500 MG tablet Commonly known as: KEPPRA Take 500 mg by mouth 2 (two) times daily.   levothyroxine 50 MCG tablet Commonly known as: SYNTHROID Take 50 mcg by mouth daily before breakfast.   LORazepam 0.5 MG tablet Commonly known as: ATIVAN Take  0.5 tablets (0.25 mg total) by mouth See admin instructions. 0.25mg  oral in the evening And 0.25mg  oral every 12 hours as needed for breakthrough anxiety   metoprolol tartrate 25 MG tablet Commonly known as: LOPRESSOR Take 1 tablet (25 mg total) by mouth 2 (two) times daily.   omeprazole 20 MG capsule Commonly known as: PRILOSEC Take 20 mg by mouth at bedtime.   polyethylene glycol 17 g packet Commonly known as: MIRALAX / GLYCOLAX Take 17 g by mouth daily.   rosuvastatin 20 MG tablet Commonly known as: CRESTOR Take 1 tablet (20 mg total) by mouth daily.   Senna Plus 8.6-50 MG Caps Generic drug: Sennosides-Docusate Sodium Take 1 capsule by mouth at bedtime.   sertraline 50 MG tablet Commonly known as: ZOLOFT Take 50 mg by mouth daily.        Follow-up Information     [facility]. Schedule an appointment as soon as possible for a visit in 1 week(s).   Contact information: [address] [place] [phone] [facility] Follow up in 1 month(s).   Contact information: [address]     [address] [place] [phone]               Allergies  Allergen Reactions   Cimetidine Palpitations and Other (See Comments)    "Allergic," per MAR   Naproxen Sodium Other (See Comments)    Affects glaucoma medication   Fluzone [Influenza Virus Vaccine] Other (See Comments)    "Allergic," per MAR   Fosamax [Alendronate Sodium] Other (See Comments)    "Allergic," per MAR   Loratadine Other (See Comments)    "Allergic," per MAR   Iodinated Contrast Media Nausea Only     Other Procedures/Studies: DG Swallowing Func-Speech Pathology  Result Date: 09/22/2022 Table formatting from the original result was not included. Images from the original result were not included. Objective Swallowing Evaluation: Type of Study: MBS-Modified Barium Swallow Study  Patient Details  Name: Raven Peterson MRN: [id number] Date of Birth: 05-09-1922 Today's Date: 09/22/2022 Time: SLP Start Time (ACUTE ONLY): 1150 -SLP Stop Time (ACUTE ONLY): 1204 SLP Time Calculation (min) (ACUTE ONLY): 14 min Past Medical History: Past Medical History: Diagnosis Date  Acute heart failure with preserved ejection fraction (HFpEF) ([place]) 01/13/2021  Acute ischemic stroke ([place]) 10/02/2014  right cerebellar and left posterior temporal CVA: This was confirmed on MRI. We suspected an embolic source given the distribution on MRI. She has no history of atrial fib and no atrial fib was seen on telemetry. She was seen by neurology in consultation and underwent carotid US which showed mild right carotid stenosis (<50%) and moderate left carotid stenosis (50-69%). She was treated with aspiri  Acute on chronic heart failure with preserved ejection fraction (HFpEF) (  [place])   CAP (community acquired pneumonia) 03/17/2015  Chronic back pain   "used to get shots in my back by a Pain Management doctor"  COVID-19   Dyspnea   GERD (gastroesophageal reflux disease)   Glaucoma   Hyperlipidemia   Hypertension   Hypothyroidism   Ileus ([place]) 05/30/2019  Inferior pubic ramus fracture, right, with routine healing, subsequent encounter   Pneumonia due to COVID-19 virus 12/10/2020  SBO (small bowel obstruction) ([place]) 05/30/2019  Seizure ([place]) 10/02/2014  Small bowel obstruction (HCC)   Thyroid disease   UTI from E.coli 2018  Vertebral compression fracture [facility])  Past Surgical History: Past Surgical History: Procedure Laterality Date  CATARACT EXTRACTION, BILATERAL    CHOLECYSTECTOMY   HPI: Pt is  88 y.o. female who presented to ED with acute onset of fall with subsequent head injury.  CXR and Head CT (10/12) both neg for acute changes/abnormality. PMH: HFpEF, CVA, GERD, hypertension, dyslipidemia, paroxysmal atrial fibrillation on Eliquis, hypothyroidism and seizure disorder.  No data recorded  Recommendations for follow up therapy are one component of a  multi-disciplinary discharge planning process, led by the attending physician.  Recommendations may be updated based on patient status, additional functional criteria and insurance authorization. Assessment / Plan / Recommendation   09/22/2022  12:22 PM Clinical Impressions Clinical Impression Pt presents with oropharyngeal dysphagia marked by reduced bolus cohesion and AP transit, poor BOT retraction, reduced velopharyngeal closure, and delayed swallow initation which resulted in consistent flash penetration of thin liquids (PAS 2) with x1 instance of trace silent aspiration (PAS 8) when taking pill whole with thin liquids. Pt very impulsive with liquid intake despite cueing for small volumes, although even large volumes were consumed without incidence of aspiration in multiple attempts by cup/straw. Prolonged mastication and bolus formation evident with regular textures, improved with sips of liquid. Poor BOT retraction resulted in inconsistent epiglottic inversion and vallecular residue. Trace-mild oral and vallecular residuals were minimized/cleared with spontaneous secondary dry swallows or liquid washes. Poor velopharyngeal closure exhibited when attempting to swallow barium tablet with thin. Subsequent nasopharyngeal reflux of liquid occured. Recommend dys 3 (mechanical soft) diet, thin liquids with 1:1 supervision for all meals to monitor for slow rate, small bites/sips, alternation of bites with sips. SLP to f/u for tolerance. SLP Visit Diagnosis Dysphagia, oropharyngeal phase (R13.12) Impact on safety and function Mild aspiration risk     09/22/2022  12:22 PM Treatment Recommendations Treatment Recommendations Therapy as outlined in treatment plan below     09/22/2022  12:22 PM Prognosis Prognosis for Safe Diet Advancement Good Barriers to Reach Goals Cognitive deficits;Time post onset   09/22/2022  12:22 PM Diet Recommendations SLP Diet Recommendations Dysphagia 3 (Mech soft) solids;Thin liquid Liquid  Administration via Cup;Straw Medication Administration Crushed with puree Compensations Minimize environmental distractions;Slow rate;Small sips/bites;Follow solids with liquid Postural Changes Seated upright at 90 degrees     09/22/2022  12:22 PM Other Recommendations Oral Care Recommendations Oral care BID Follow Up Recommendations Other (comment) Assistance recommended at discharge Frequent or constant Supervision/Assistance Functional Status Assessment Patient has had a recent decline in their functional status and demonstrates the ability to make significant improvements in function in a reasonable and predictable amount of time.   09/22/2022  12:22 PM Frequency and Duration  Speech Therapy Frequency (ACUTE ONLY) min 2x/week Treatment Duration 2 weeks     09/22/2022  12:22 PM Oral Phase Oral Phase Impaired Oral - Thin Cup Lingual pumping;Weak lingual manipulation;Reduced posterior propulsion;Lingual/palatal residue;Decreased bolus cohesion Oral - Thin  Straw Lingual pumping;Weak lingual manipulation;Reduced posterior propulsion;Lingual/palatal residue;Decreased bolus cohesion;Decreased velopharyngeal closure Oral - Puree Lingual pumping;Weak lingual manipulation;Reduced posterior propulsion;Lingual/palatal residue;Decreased bolus cohesion Oral - Regular Lingual pumping;Weak lingual manipulation;Reduced posterior propulsion;Lingual/palatal residue;Decreased bolus cohesion;Impaired mastication Oral - Pill Reduced posterior propulsion;Decreased velopharyngeal closure    09/22/2022  12:22 PM Pharyngeal Phase Pharyngeal Phase Impaired Pharyngeal- Thin Cup Delayed swallow initiation-pyriform sinuses;Reduced epiglottic inversion;Reduced tongue base retraction;Penetration/Aspiration during swallow;Pharyngeal residue - valleculae;Nasopharyngeal reflux Pharyngeal Material enters airway, remains ABOVE vocal cords then ejected out Pharyngeal- Thin Straw Delayed swallow initiation-pyriform sinuses;Reduced epiglottic  inversion;Reduced tongue base retraction;Penetration/Aspiration during swallow;Pharyngeal residue - valleculae;Trace aspiration;Nasopharyngeal reflux Pharyngeal Material enters airway, passes BELOW cords without attempt by patient to eject out (silent aspiration) Pharyngeal- Puree Reduced epiglottic inversion;Reduced tongue base retraction;Pharyngeal residue - valleculae Pharyngeal- Regular Reduced epiglottic inversion;Reduced tongue base retraction;Pharyngeal residue - valleculae     No data to display    [place] [facility], [place], [place] Office Number: [phone] Acie Fredrickson 09/22/2022, 1:36 PM                     CT Head Wo Contrast  Result Date: 09/21/2022 CLINICAL DATA:  Head trauma, minor (Age >= 65y). fall EXAM: CT HEAD WITHOUT CONTRAST TECHNIQUE: Contiguous axial images were obtained from the base of the skull through the vertex without intravenous contrast. RADIATION DOSE REDUCTION: This exam was performed according to the departmental dose-optimization program which includes automated exposure control, adjustment of the mA and/or kV according to patient size and/or use of iterative reconstruction technique. COMPARISON:  CT head 09/10/2019 BRAIN: BRAIN Cerebral ventricle sizes are concordant with the degree of cerebral volume loss. Patchy and confluent areas of decreased attenuation are noted throughout the deep and periventricular white matter of the cerebral hemispheres bilaterally, compatible with chronic microvascular ischemic disease. Chronic right occipital infarction. No evidence of large-territorial acute infarction. No parenchymal hemorrhage. No mass lesion. No extra-axial collection. No mass effect or midline shift. No hydrocephalus. Basilar cisterns are patent. Vascular: No hyperdense vessel. Skull: No acute fracture or focal lesion. Sinuses/Orbits: Paranasal sinuses and mastoid air cells are clear. Bilateral lens replacement. The orbits are unremarkable.  Other: None. IMPRESSION: No acute intracranial abnormality. Electronically Signed   By: Iven Finn M.D.   On: 09/21/2022 22:55   DG Chest Portable 1 View  Result Date: 09/21/2022 CLINICAL DATA:  Level 2 trauma, fall. EXAM: PORTABLE CHEST 1 VIEW COMPARISON:  09/07/2022. FINDINGS: The heart is enlarged and the mediastinal contour is stable. There is atherosclerotic calcification of the aorta. Lung volumes are low. Mild interstitial prominence is present bilaterally and likely chronic. No consolidation, effusion, or pneumothorax. Mild apical pleural scarring is noted bilaterally. No acute osseous abnormality. IMPRESSION: No active disease. Electronically Signed   By: Brett Fairy M.D.   On: 09/21/2022 22:39   CT HEAD WO CONTRAST (5MM)  Result Date: 09/09/2022 CLINICAL DATA:  Stroke follow-up EXAM: CT HEAD WITHOUT CONTRAST TECHNIQUE: Contiguous axial images were obtained from the base of the skull through the vertex without intravenous contrast. RADIATION DOSE REDUCTION: This exam was performed according to the departmental dose-optimization program which includes automated exposure control, adjustment of the mA and/or kV according to patient size and/or use of iterative reconstruction technique. COMPARISON:  Brain MRI from yesterday FINDINGS: Brain: Recent infarcts in the right occipital cortex and bilateral thalamus by MRI. Chronic small vessel ischemia in the deep cerebral white matter. Remote perforator infarct in the right striatum. No hematoma, hydrocephalus, or shift. Vascular:  Prominent distal basilar density which may relate to the CTA findings. Atheromatous calcification Skull: None Sinuses/Orbits: No acute finding. IMPRESSION: Recent posterior circulation infarcts without evidence of progression. No shift or hematoma. Electronically Signed   By: Jorje Guild M.D.   On: 09/09/2022 07:59   MR BRAIN WO CONTRAST  Result Date: 09/08/2022 CLINICAL DATA:  Stroke follow-up EXAM: MRI HEAD WITHOUT  CONTRAST TECHNIQUE: Multiplanar, multiecho pulse sequences of the brain and surrounding structures were obtained without intravenous contrast. COMPARISON:  No prior MRI, correlation is made with CT head and CTA head and neck 09/07/2022 FINDINGS: Evaluation is somewhat limited by motion artifact. Brain: Restricted diffusion with ADC correlate in the medial right occipital lobe and posterior hippocampus (series 5, images 64-70), with an additional focus of restricted diffusion in the anteromedial right thalamus (series 5, image 72), consistent with acute infarcts. The right occipital infarct is associated with gyriform hemosiderin deposition, likely hemorrhage. Surrounding T2 hyperintense signal, likely edema, with mild local mass effect. No mass, midline shift, hydrocephalus or extra-axial collection. Additional foci of hemosiderin deposition in the right thalamus and right temporal lobe, likely sequela more remote microhemorrhages. Confluent T2 hyperintense signal in the periventricular white matter, likely the sequela of moderate chronic small vessel ischemic disease. Vascular: Normal arterial flow voids. Skull and upper cervical spine: Normal marrow signal. Sinuses/Orbits: No acute finding. Status post bilateral lens replacements. Other: The mastoids are well aerated. IMPRESSION: Acute infarcts in the medial right occipital lobe and posterior hippocampus, with an additional focus of acute infarct in the anteromedial right thalamus. The right occipital infarct is associated with gyriform hemosiderin deposition, likely hemorrhage, with mild local mass effect but no midline shift. These results will be called to the ordering clinician or representative by the Radiologist Assistant, and communication documented in the PACS or [employer]. Electronically Signed   By: Merilyn Baba M.D.   On: 09/08/2022 22:17   ECHOCARDIOGRAM COMPLETE  Result Date: 09/08/2022    ECHOCARDIOGRAM REPORT   Patient Name:   Raven Peterson Date of Exam: 09/08/2022 Medical Rec #:  [id number]    Height:       64.0 in Accession #:    [id number]   Weight:       153.0 lb Date of Birth:  1922-02-01    BSA:          1.746 m Patient Age:    80 years     BP:           164/73 mmHg Patient Gender: F            HR:           93 bpm. Exam Location:  Inpatient Procedure: 2D Echo, Cardiac Doppler and Color Doppler Indications:    Stroke 434.91 / I163.9  History:        Patient has prior history of Echocardiogram examinations, most                 recent 05/31/2019. Stroke, Signs/Symptoms:Dyspnea; Risk                 Factors:Hypertension and Dyslipidemia.  Sonographer:    Ronny Flurry Sonographer#2:  Melissa Morford RDCS (AE, PE) Referring Phys: [phone] MCNEILL P KIRKPATRICK  Sonographer Comments: Technically difficult study due to poor echo windows, suboptimal parasternal window and suboptimal apical window. IMPRESSIONS  1. Left ventricular ejection fraction, by estimation, is 60 to 65%. The left ventricle has normal function. The left ventricle has no regional wall motion  abnormalities. There is severe left ventricular hypertrophy. Left ventricular diastolic parameters  are indeterminate.  2. Right ventricular systolic function is normal. The right ventricular size is normal.  3. The mitral valve is normal in structure. Mild mitral valve regurgitation. No evidence of mitral stenosis. Severe mitral annular calcification.  4. The aortic valve has an indeterminant number of cusps. Aortic valve regurgitation is not visualized. Mild aortic valve stenosis.  5. The inferior vena cava is normal in size with greater than 50% respiratory variability, suggesting right atrial pressure of 3 mmHg. FINDINGS  Left Ventricle: Left ventricular ejection fraction, by estimation, is 60 to 65%. The left ventricle has normal function. The left ventricle has no regional wall motion abnormalities. The left ventricular internal cavity size was normal in size. There is  severe left  ventricular hypertrophy. Left ventricular diastolic parameters are indeterminate. Right Ventricle: The right ventricular size is normal. Right ventricular systolic function is normal. Left Atrium: Left atrial size was normal in size. Right Atrium: Right atrial size was normal in size. Pericardium: There is no evidence of pericardial effusion. Mitral Valve: The mitral valve is normal in structure. Severe mitral annular calcification. Mild mitral valve regurgitation. No evidence of mitral valve stenosis. MV peak gradient, 6.6 mmHg. The mean mitral valve gradient is 4.0 mmHg. Tricuspid Valve: The tricuspid valve is normal in structure. Tricuspid valve regurgitation is trivial. No evidence of tricuspid stenosis. Aortic Valve: The aortic valve has an indeterminant number of cusps. Aortic valve regurgitation is not visualized. Mild aortic stenosis is present. Aortic valve mean gradient measures 13.5 mmHg. Aortic valve peak gradient measures 20.8 mmHg. Aortic valve  area, by VTI measures 0.72 cm. Pulmonic Valve: The pulmonic valve was not well visualized. Pulmonic valve regurgitation is trivial. No evidence of pulmonic stenosis. Aorta: The aortic root is normal in size and structure. Venous: The inferior vena cava is normal in size with greater than 50% respiratory variability, suggesting right atrial pressure of 3 mmHg. IAS/Shunts: No atrial level shunt detected by color flow Doppler.  LEFT VENTRICLE PLAX 2D LVIDd:         3.80 cm   Diastology LVIDs:         3.50 cm   LV e' medial:    2.70 cm/s LV PW:         1.10 cm   LV E/e' medial:  56.9 LV IVS:        1.60 cm   LV e' lateral:   4.35 cm/s LVOT diam:     1.80 cm   LV E/e' lateral: 35.3 LV SV:         36 LV SV Index:   21 LVOT Area:     2.54 cm  RIGHT VENTRICLE RV S prime:     7.35 cm/s TAPSE (M-mode): 1.4 cm LEFT ATRIUM             Index        RIGHT ATRIUM           Index LA diam:        3.80 cm 2.18 cm/m   RA Area:     11.75 cm LA Vol (A2C):   39.5 ml 22.63 ml/m   RA Volume:   23.45 ml  13.43 ml/m LA Vol (A4C):   67.0 ml 38.38 ml/m LA Biplane Vol: 53.7 ml 30.76 ml/m  AORTIC VALVE AV Area (Vmax):    0.81 cm AV Area (Vmean):   0.76 cm AV Area (VTI):  0.72 cm AV Vmax:           228.00 cm/s AV Vmean:          175.000 cm/s AV VTI:            0.494 m AV Peak Grad:      20.8 mmHg AV Mean Grad:      13.5 mmHg LVOT Vmax:         72.47 cm/s LVOT Vmean:        52.533 cm/s LVOT VTI:          0.141 m LVOT/AV VTI ratio: 0.28  AORTA Ao Root diam: 2.70 cm Ao Asc diam:  3.00 cm MITRAL VALVE MV Area (PHT): 3.97 cm     SHUNTS MV Area VTI:   1.31 cm     Systemic VTI:  0.14 m MV Peak grad:  6.6 mmHg     Systemic Diam: 1.80 cm MV Mean grad:  4.0 mmHg MV Vmax:       1.28 m/s MV Vmean:      94.1 cm/s MV Decel Time: 191 msec MV E velocity: 153.50 cm/s MV A velocity: 112.00 cm/s MV E/A ratio:  1.37 Kirk Ruths MD Electronically signed by Kirk Ruths MD Signature Date/Time: 09/08/2022/12:42:29 PM    Final    DG Chest Portable 1 View  Result Date: 09/07/2022 CLINICAL DATA:  Possible aspiration EXAM: PORTABLE CHEST 1 VIEW COMPARISON:  01/13/2021 FINDINGS: Heart is normal size. Diffuse interstitial prominence throughout the lungs, similar prior study. Favor chronic interstitial lung disease. No effusions or acute bony abnormality. Aortic atherosclerosis. IMPRESSION: Diffuse interstitial prominence throughout the lungs, favor chronic interstitial lung disease. Electronically Signed   By: Rolm Baptise M.D.   On: 09/07/2022 22:09   CT ANGIO HEAD NECK W WO CM (CODE STROKE)  Result Date: 09/07/2022 CLINICAL DATA:  Neuro deficit, acute, stroke suspected. EXAM: CT ANGIOGRAPHY HEAD AND NECK TECHNIQUE: Multidetector CT imaging of the head and neck was performed using the standard protocol during bolus administration of intravenous contrast. Multiplanar CT image reconstructions and MIPs were obtained to evaluate the vascular anatomy. Carotid stenosis measurements (when applicable) are  obtained utilizing NASCET criteria, using the distal internal carotid diameter as the denominator. RADIATION DOSE REDUCTION: This exam was performed according to the departmental dose-optimization program which includes automated exposure control, adjustment of the mA and/or kV according to patient size and/or use of iterative reconstruction technique. CONTRAST:  59mL OMNIPAQUE IOHEXOL 350 MG/ML SOLN COMPARISON:  None Available. FINDINGS: CTA NECK FINDINGS Aortic arch: Standard 3 vessel aortic arch with moderately extensive calcified and soft plaque. No flow limiting stenosis of the arch vessel origins. Right carotid system: Patent with a moderate amount of calcified plaque at the carotid bifurcation resulting in severe stenosis of the ECA origin. No significant common or internal carotid artery stenosis. Left carotid system: Patent with a small amount of calcified plaque at the carotid bifurcation. No evidence of a significant stenosis or dissection. Vertebral arteries: Patent with the left being strongly dominant. Mild atherosclerotic irregularity of the left vertebral artery without significant stenosis. Severe stenosis of the right vertebral origin. Mild right V3 segment stenosis. Skeleton: Moderate lower cervical disc degeneration. Moderately advanced mid upper cervical facet arthrosis with bilateral facet ankylosis at C2-3. Other neck: No evidence of cervical lymphadenopathy or mass. Upper chest: Mosaic attenuation, mild interlobular septal thickening, and bronchial wall thickening in the included upper lobes. Review of the MIP images confirms the above findings CTA HEAD FINDINGS Anterior circulation:  The internal carotid arteries are patent from skull base to carotid termini with calcified plaque resulting in mild cavernous and moderate paraclinoid stenosis bilaterally. ACAs and MCAs are patent with relatively widespread atherosclerosis involving the branch vessels including severe bilateral M2 and A2  stenoses. There are also severe right A1 and mild left M1 stenoses. No aneurysm is identified. Posterior circulation: The intracranial left vertebral artery is patent and supplies the basilar. The right vertebral artery ends in PICA. The basilar artery is patent proximally, however there is a filling defect in the distal basilar artery consistent with thrombus which extends into the proximal right P1 segment. There is reconstitution of the right P1 segment more distally, however there is additional occlusion of the right PCA at the P1-P2 junction with distal reconstitution of the distal P2 segment and P3 branches. The left PCA is patent, however its origin is severely narrowed by the distal basilar thrombus, and there are also severe left P2 and P3 stenoses. No aneurysm is identified. Venous sinuses: As permitted by contrast timing, patent. Anatomic variants: None. Review of the MIP images confirms the above findings These results were communicated to Dr. Leonel Ramsay at 9:15 pm on 09/07/2022 by text page via the [facility] messaging system. IMPRESSION: 1. 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 2. Additional occlusion of the right PCA at the P1-P2 junction with distal reconstitution. 3. Intracranial atherosclerosis including severe bilateral M2 and A2 stenoses and mild-to-moderate ICA stenoses. 4. Severe stenosis of the origin of the nondominant right vertebral artery. 5.  Aortic Atherosclerosis (ICD10-I70.0). Electronically Signed   By: Logan Bores M.D.   On: 09/07/2022 21:34   CT HEAD CODE STROKE WO CONTRAST  Result Date: 09/07/2022 CLINICAL DATA:  Code stroke. EXAM: CT HEAD WITHOUT CONTRAST TECHNIQUE: Contiguous axial images were obtained from the base of the skull through the vertex without intravenous contrast. RADIATION DOSE REDUCTION: This exam was performed according to the departmental dose-optimization program which includes  automated exposure control, adjustment of the mA and/or kV according to patient size and/or use of iterative reconstruction technique. COMPARISON:  09/27/2021 FINDINGS: Brain: No evidence of acute infarction, hemorrhage, cerebral edema, mass, mass effect, or midline shift. No hydrocephalus or extra-axial collection. Periventricular white matter changes, likely the sequela of chronic small vessel ischemic disease. Remote lacunar infarct in the right lentiform nucleus. Vascular: No hyperdense vessel. Atherosclerotic calcifications in the intracranial carotid and vertebral arteries. Skull: Negative for fracture or focal lesion. Sinuses/Orbits: No acute finding. Other: The mastoid air cells are well aerated. ASPECTS [facility] Stroke Program Early CT Score) - Ganglionic level infarction (caudate, lentiform nuclei, internal capsule, insula, M1-M3 cortex): 7 - Supraganglionic infarction (M4-M6 cortex): 3 Total score (0-10 with 10 being normal): 10 IMPRESSION: 1. No acute intracranial process. 2. ASPECTS is 10 Code stroke imaging results were communicated on 09/07/2022 at 8:47 pm to provider Dr. Leonel Ramsay via secure text paging. Electronically Signed   By: Merilyn Baba M.D.   On: 09/07/2022 20:48     TODAY-DAY OF DISCHARGE:  Subjective:   Raven Peterson today has no headache,no chest abdominal pain,no new weakness tingling or numbness, feels much better wants to go home today.   Objective:   Blood pressure 105/71, pulse 99, temperature 97.8 F (36.6 C), temperature source Oral, resp. rate 19, height 5\' 4"  (1.626 m), weight 69.4 kg, SpO2 98 %.  Intake/Output Summary (Last 24 hours) at 09/26/2022 0847 Last data filed at 09/26/2022 0630  Gross per 24 hour  Intake --  Output 600 ml  Net -600 ml   Filed Weights   09/21/22 2227  Weight: 69.4 kg    Exam: Awake Alert, Oriented *3, No new F.N deficits, Normal affect [place].AT,PERRAL Supple Neck,No JVD, No cervical lymphadenopathy appriciated.  Symmetrical Chest  wall movement, Good air movement bilaterally, CTAB RRR,No Gallops,Rubs or new Murmurs, No Parasternal Heave +ve B.Sounds, Abd Soft, Non tender, No organomegaly appriciated, No rebound -guarding or rigidity. No Cyanosis, Clubbing or edema, No new Rash or bruise   PERTINENT RADIOLOGIC STUDIES: No results found.   PERTINENT LAB RESULTS: CBC: No results for input(s): "WBC", "HGB", "HCT", "PLT" in the last 72 hours. CMET CMP     Component Value Date/Time   NA 129 (L) 09/25/2022 0343   K 3.6 09/25/2022 0343   CL 99 09/25/2022 0343   CO2 17 (L) 09/25/2022 0343   GLUCOSE 115 (H) 09/25/2022 0343   BUN 38 (H) 09/25/2022 0343   CREATININE 1.56 (H) 09/25/2022 0343   CALCIUM 7.7 (L) 09/25/2022 0343   PROT 6.0 (L) 09/21/2022 2212   ALBUMIN 2.6 (L) 09/21/2022 2212   AST 21 09/21/2022 2212   ALT 13 09/21/2022 2212   ALKPHOS 62 09/21/2022 2212   BILITOT 0.7 09/21/2022 2212   GFRNONAA 30 (L) 09/25/2022 0343   GFRAA >60 06/03/2019 0522    GFR Estimated Creatinine Clearance: 19.7 mL/min (A) (by C-G formula based on SCr of 1.56 mg/dL (H)). No results for input(s): "LIPASE", "AMYLASE" in the last 72 hours. No results for input(s): "CKTOTAL", "CKMB", "CKMBINDEX", "TROPONINI" in the last 72 hours. Invalid input(s): "POCBNP" No results for input(s): "DDIMER" in the last 72 hours. No results for input(s): "HGBA1C" in the last 72 hours. No results for input(s): "CHOL", "HDL", "LDLCALC", "TRIG", "CHOLHDL", "LDLDIRECT" in the last 72 hours. No results for input(s): "TSH", "T4TOTAL", "T3FREE", "THYROIDAB" in the last 72 hours.  Invalid input(s): "FREET3" No results for input(s): "VITAMINB12", "FOLATE", "FERRITIN", "TIBC", "IRON", "RETICCTPCT" in the last 72 hours. Coags: No results for input(s): "INR" in the last 72 hours.  Invalid input(s): "PT" Microbiology: No results found for this or any previous visit (from the past 240 hour(s)).  FURTHER DISCHARGE INSTRUCTIONS:  Get Medicines  reviewed and adjusted: Please take all your medications with you for your next visit with your Primary MD  Laboratory/radiological data: Please request your Primary MD to go over all hospital tests and procedure/radiological results at the follow up, please ask your Primary MD to get all Hospital records sent to his/her office.  In some cases, they will be blood work, cultures and biopsy results pending at the time of your discharge. Please request that your primary care M.D. goes through all the records of your hospital data and follows up on these results.  Also Note the following: If you experience worsening of your admission symptoms, develop shortness of breath, life threatening emergency, suicidal or homicidal thoughts you must seek medical attention immediately by calling 911 or calling your MD immediately  if symptoms less severe.  You must read complete instructions/literature along with all the possible adverse reactions/side effects for all the Medicines you take and that have been prescribed to you. Take any new Medicines after you have completely understood and accpet all the possible adverse reactions/side effects.   Do not drive when taking Pain medications or sleeping medications (Benzodaizepines)  Do not take more than prescribed Pain, Sleep and Anxiety Medications. It is not advisable to combine anxiety,sleep and  pain medications without talking with your primary care practitioner  Special Instructions: If you have smoked or chewed Tobacco  in the last 2 yrs please stop smoking, stop any regular Alcohol  and or any Recreational drug use.  Wear Seat belts while driving.  Please note: You were cared for by a hospitalist during your hospital stay. Once you are discharged, your primary care physician will handle any further medical issues. Please note that NO REFILLS for any discharge medications will be authorized once you are discharged, as it is imperative that you return to  your primary care physician (or establish a relationship with a primary care physician if you do not have one) for your post hospital discharge needs so that they can reassess your need for medications and monitor your lab values.  Total Time spent coordinating discharge including counseling, education and face to face time equals greater than 30 minutes.  SignedOren Binet 09/26/2022 8:47 AM

## 2022-09-27 DIAGNOSIS — I4891 Unspecified atrial fibrillation: Secondary | ICD-10-CM | POA: Diagnosis not present

## 2022-09-27 MED ORDER — SODIUM CHLORIDE 0.9 % IV SOLN: INTRAVENOUS | Status: DC

## 2022-09-27 NOTE — Progress Notes (Signed)
PROGRESS NOTE        PATIENT DETAILS Name: Raven Peterson Age: 88 y.o. Sex: female Date of Birth: 05-17-1922 Admit Date: 09/21/2022 Admitting Physician [name], MD HQP:[id number], No Pcp Per  Brief Summary:  Patient is a 88 y.o.  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  Significant events: 9/28-10/2>> acute right PCA infarct-embolic-received tPA-started on Eliquis (not on Eliquis prior) 10/12>> admit to [facility]-mechanical fall-A-fib RVR  Significant studies: 10/12>> CXR: No PNA 10/12>> CT head: No acute intracranial abnormality.  Significant microbiology data: None  Procedures: None  Consults: Cardiology  Subjective: No significant events overnight as discussed with staff, patient denies any complaints today.  Objective: Vitals: Blood pressure (!) 113/59, pulse 63, temperature (!) 97.4 F (36.3 C), temperature source Axillary, resp. rate 20, height 5\' 4"  (1.626 m), weight 69.4 kg, SpO2 93 %.   Exam:  Awake Alert, frail, deconditioned, no apparent distress Good air entry bilaterally. RRR,No Gallops,Rubs or new Murmurs, No Parasternal Heave +ve B.Sounds, Abd Soft, No tenderness, No rebound - guarding or rigidity. No Cyanosis, Clubbing or edema, No new Rash or bruise    Pertinent Labs/Radiology:    Latest Ref Rng & Units 09/22/2022    2:08 AM 09/21/2022   10:12 PM 09/11/2022    2:58 AM  CBC  WBC 4.0 - 10.5 K/uL 9.6  8.1  8.1   Hemoglobin 12.0 - 15.0 g/dL 12.1  12.4  13.8   Hematocrit 36.0 - 46.0 % 35.1  36.2  40.8   Platelets 150 - 400 K/uL 388  382  200     Lab Results  Component Value Date   NA 129 (L) 09/25/2022   K 3.6 09/25/2022   CL 99 09/25/2022   CO2 17 (L) 09/25/2022      Assessment/Plan:  PAF  with RVR -Currently heart rate is controlled -Continue with beta-blockers - Continue with Eliquis for anticoagulation, as previous MD discussed with family about risks and benefits, and outpatient is being discharged to supervised environment at SNF, she will be resumed on Eliquis.  Mechanical fall Denies syncope-claims this was due to incoordination May need SNF on discharge (currently at ALF)-see above discussion.  AKI: Mild-creatinine levels have plateaued  Encourage oral intake.   Avoid nephrotoxic agents.  Recent right PCA infarct-s/p TNK Reviewed most recent discharge summary-plan was to start DOAC 2 days postdischarge-but per daughter-this never got started. Now on Eliquis (see above discussion regarding risk versus benefits) On statin  Seizure disorder Continue Keppra/Neurontin  HTN BP soft-continue metoprolol  HLD Statin  GERD PPI  Hyponatremia -Mild, symptomatic, continue with gentle hydration  Anxiety/depression Stable-Zoloft/Ativan   BMI: Estimated body mass index is 26.26 kg/m as calculated from the following:   Height as of this encounter: 5\' 4"  (1.626 m).   Weight as of this encounter: 69.4 kg.   Code status:   Code Status: DNR   DVT Prophylaxis: apixaban (ELIQUIS) tablet 2.5 mg Start: 09/25/22 1000Eliquis apixaban (ELIQUIS) tablet 2.5 mg    Family Communication: None at bedside   Disposition Plan: Status is: Inpatient Remains inpatient appropriate because: A-fib RVR-mechanical fall-needs PT/OT assessment-possible SNF on discharge.  Planned Discharge Destination:Skilled nursing facility vs ALF.  Discussed with TOC team today-we will await further recommendations.   Diet: Diet Order             Diet - low sodium heart healthy           DIET DYS 3 Room service appropriate? No; Fluid consistency: Thin  Diet effective now                     Antimicrobial agents: Anti-infectives (From admission, onward)    None         MEDICATIONS: Scheduled Meds:  apixaban  2.5 mg Oral BID   feeding supplement  237 mL Oral TID BM   gabapentin  100 mg Oral QHS   levETIRAcetam  500 mg Oral BID   levothyroxine  50 mcg Oral Q0600   LORazepam  0.25 mg Oral QHS   metoprolol tartrate  25 mg Oral BID   pantoprazole  40 mg Oral Daily   polyethylene glycol  17 g Oral Daily   rosuvastatin  20 mg Oral Daily   senna-docusate  1 tablet Oral QHS   sertraline  50 mg Oral Daily   Continuous Infusions:  sodium chloride     PRN Meds:.acetaminophen **OR** acetaminophen, LORazepam, magnesium hydroxide, ondansetron **OR** ondansetron (ZOFRAN) IV, traMADol, traZODone   I have personally reviewed following labs and imaging studies  LABORATORY DATA: CBC: Recent Labs  Lab 09/21/22 2212 09/22/22 0208  WBC 8.1 9.6  NEUTROABS 6.4  --   HGB 12.4 12.1  HCT 36.2 35.1*  MCV 95.0 93.1  PLT 382 [id number]    Basic Metabolic Panel: Recent Labs  Lab 09/21/22 2212 09/22/22 0208 09/23/22 0428 09/24/22 0347 09/25/22 0343  NA 135 134* 132* 131* 129*  K 3.1* 3.0* 3.9 4.0 3.6  CL 100 98 99 101 99  CO2 24 26 18* 18* 17*  GLUCOSE 121* 137* 117* 120* 115*  BUN 15 16 24* 32* 38*  CREATININE 1.07* 1.08* 1.46* 1.59* 1.56*  CALCIUM 8.1* 8.1* 8.2* 8.0* 7.7*  MG  --  1.9 2.5*  --   --     GFR: Estimated Creatinine Clearance: 19.7 mL/min (A) (by C-G formula based on SCr of 1.56 mg/dL (H)).  Liver Function Tests: Recent Labs  Lab 09/21/22 2212  AST 21  ALT 13  ALKPHOS 62  BILITOT 0.7  PROT 6.0*  ALBUMIN 2.6*   No results for input(s): "LIPASE", "AMYLASE" in the last 168 hours. No results for input(s): "AMMONIA" in the last 168 hours.  Coagulation Profile: No results for input(s): "INR", "PROTIME" in the last 168 hours.  Cardiac Enzymes: No results for input(s): "CKTOTAL", "CKMB", "CKMBINDEX", "TROPONINI" in the last 168 hours.  BNP (last 3 results) No results for input(s): "PROBNP" in the last 8760 hours.  Lipid  Profile: No results for input(s): "CHOL", "HDL", "LDLCALC", "TRIG", "CHOLHDL", "LDLDIRECT" in the last 72 hours.  Thyroid Function Tests: No results for input(s): "TSH", "T4TOTAL", "FREET4", "T3FREE", "THYROIDAB" in the last 72 hours.   Anemia Panel: No results for input(s): "VITAMINB12", "FOLATE", "FERRITIN", "TIBC", "IRON", "RETICCTPCT" in the last 72 hours.  Urine analysis:    Component Value Date/Time   COLORURINE YELLOW 09/09/2022 1230   APPEARANCEUR HAZY (A) 09/09/2022 1230   LABSPEC 1.019 09/09/2022 1230   PHURINE 6.0 09/09/2022 1230   GLUCOSEU NEGATIVE 09/09/2022 1230   HGBUR NEGATIVE 09/09/2022 [place] 09/09/2022 [place] 09/09/2022 1230   PROTEINUR  NEGATIVE 09/09/2022 1230   NITRITE NEGATIVE 09/09/2022 1230   LEUKOCYTESUR MODERATE (A) 09/09/2022 1230    Sepsis Labs: Lactic Acid, Venous    Component Value Date/Time   LATICACIDVEN 1.3 10/19/2020 1929    MICROBIOLOGY: No results found for this or any previous visit (from the past 240 hour(s)).  RADIOLOGY STUDIES/RESULTS: No results found.   LOS: 5 days   Phillips Climes, MD  [facility]    To contact the attending provider between 7A-7P or the covering provider during after hours 7P-7A, please log into the web site [email or web address] and access using universal  password for that web site. If you do not have the password, please call the hospital operator.  09/27/2022, 11:25 AM

## 2022-09-27 NOTE — TOC Progression Note (Addendum)
Transition of Care [facility]) - Progression Note    Patient Details  Name: Raven Peterson MRN: [id number] Date of Birth: Jul 11, 1922  Transition of [facility]) CM/SW [place], LCSW Phone Number: 09/27/2022, 9:06 AM  Clinical Narrative:    CSW received call from patient's daughter. She was asking if she could not find a SNF by tomorrow when the appeal comes back if [name] could take her back until she can find one with better ratings. CSW called her back and made her aware that if she leaves the hospital there is a possibility that it is more difficult for patient to get into a SNF from ALF and that [name] wanted her to have a higher level of care. CSW received response from [facility] and they have a bed available for patient tomorrow. CSW presented bed offer to patient's daughter and she would like to accept the bed (and additional $43/day private pay cost). Will likely hear back from [employer] today.      Barriers to Discharge:  (may need higher level of care)  Expected Discharge Plan and Services   In-house Referral: Clinical Social Work     Living arrangements for the past 2 months: [place] Expected Discharge Date: 09/26/22                                     Social Determinants of Health (SDOH) Interventions    Readmission Risk Interventions     No data to display

## 2022-09-28 NOTE — Discharge Summary (Signed)
PATIENT DETAILS Name: GRASIELA HUSSONG Age: 88 y.o. Sex: female Date of Birth: 01/30/1922 MRN: [id number]. Admitting Physician: Jonetta Osgood, MD QP:[id number], No Pcp Per  Admit Date: 09/21/2022 Discharge date: 09/28/2022   No significant updates since discharge summary dictated by Dr. Velva Harman on 10/17, patient remains on 2 L nasal cannula, encouraged to use incentive spirometry and flutter valve, wean oxygen as tolerated, please encourage oral intake, and feed with total assist, and please consider palliative follow-up at SNF.  Recommendations for Outpatient Follow-up:  Follow up with PCP in 1-2 weeks Please obtain CMP/CBC in one week Please ensure palliative follow-up at SNF Continue risks/benefits assessment of being on anticoagulation Ensure follow-up with cardiology/neurology  Admitted From:  ALF   Disposition: Skilled nursing facility   Discharge Condition: fair  CODE STATUS:   Code Status: DNR   Diet recommendation:  Diet Order             Diet - low sodium heart healthy           DIET DYS 3 Room service appropriate? No; Fluid consistency: Thin  Diet effective now                    Brief Summary: Patient is a 88 y.o.  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   Significant events: 9/28-10/2>> acute right PCA infarct-embolic-received tPA-started on Eliquis (not on Eliquis prior) 10/12>> admit to [facility]-mechanical fall-A-fib RVR   Significant studies: 10/12>> CXR: No PNA 10/12>> CT head: No acute intracranial abnormality.   Significant microbiology data: None   Procedures: None   Consults: Cardiology  Brief Hospital Course: PAF with RVR Rate controlled-continue beta-blocker Remains on Eliquis Ensure outpatient follow-up  with cardiology   (Note-discussed with stroke MD Dr. Leonie Man on 10/13-who reviewed chart-recommended if family agreeable and patient in a supervised setting at ALF/SNF-okay to resume-subsequently discussed with daughter-[name]-she understands difficult situation-understands the risk of bleeding, catastrophic fall-leading to life-threatening/life disabling consequences-but elects to resume Eliquis)   Mechanical fall Denies syncope-claims this was due to incoordination Plans are for SNF on discharge (currently at ALF)-see above discussion.   AKI: Mild-repeat electrolytes in a week or so Encourage oral intake.   Avoid nephrotoxic agents.  Hyponatremia Mild Asymptomatic Repeat electrolytes in 1 week   Recent right PCA infarct-s/p TNK Reviewed most recent discharge summary-plan was to start DOAC 2 days postdischarge-but per daughter-this never got started. Now on Eliquis (see above discussion regarding risk versus benefits) On statin   Seizure disorder Continue Keppra/Neurontin   HTN BP stable-continue metoprolol   HLD Statin   GERD PPI   Anxiety/depression Stable-Zoloft/Ativan  Palliative care DNR in place Elderly/frail lady with significant medical issues-not a candidate for aggressive care Please ensure follow-up with palliative care at SNF.  BMI: Estimated body mass index is 26.26 kg/m as calculated from the following:   Height as of this encounter: 5\' 4"  (1.626 m).   Weight as of this encounter: 69.4 kg.    Note--[name] [phone]  called on 10/17-left voicemail.  Discharge Diagnoses:  Principal Problem:   Atrial fibrillation with rapid ventricular response [facility]) Active Problems:   Fall at home, initial encounter   Hypokalemia   Hypothyroidism   Essential hypertension   Seizure disorder ([place])  GERD (gastroesophageal reflux disease)   Hyperlipidemia   Anxiety and depression   Atrial fibrillation with RVR [facility])   Discharge  Instructions:  Activity:  As tolerated with Full fall precautions use walker/cane & assistance as needed  Discharge Instructions     Diet - low sodium heart healthy   Complete by: As directed    Discharge instructions   Complete by: As directed    Follow with Primary MD  in 1-2 weeks  Please get a complete blood count and chemistry panel checked by your Primary MD at your next visit, and again as instructed by your Primary MD.  Get Medicines reviewed and adjusted: Please take all your medications with you for your next visit with your Primary MD  Laboratory/radiological data: Please request your Primary MD to go over all hospital tests and procedure/radiological results at the follow up, please ask your Primary MD to get all Hospital records sent to his/her office.  In some cases, they will be blood work, cultures and biopsy results pending at the time of your discharge. Please request that your primary care M.D. follows up on these results.  Also Note the following: If you experience worsening of your admission symptoms, develop shortness of breath, life threatening emergency, suicidal or homicidal thoughts you must seek medical attention immediately by calling 911 or calling your MD immediately  if symptoms less severe.  You must read complete instructions/literature along with all the possible adverse reactions/side effects for all the Medicines you take and that have been prescribed to you. Take any new Medicines after you have completely understood and accpet all the possible adverse reactions/side effects.   Do not drive when taking Pain medications or sleeping medications (Benzodaizepines)  Do not take more than prescribed Pain, Sleep and Anxiety Medications. It is not advisable to combine anxiety,sleep and pain medications without talking with your primary care practitioner  Special Instructions: If you have smoked or chewed Tobacco  in the last 2 yrs please stop smoking, stop  any regular Alcohol  and or any Recreational drug use.  Wear Seat belts while driving.  Please note: You were cared for by a hospitalist during your hospital stay. Once you are discharged, your primary care physician will handle any further medical issues. Please note that NO REFILLS for any discharge medications will be authorized once you are discharged, as it is imperative that you return to your primary care physician (or establish a relationship with a primary care physician if you do not have one) for your post hospital discharge needs so that they can reassess your need for medications and monitor your lab values.   Increase activity slowly   Complete by: As directed       Allergies as of 09/28/2022       Reactions   Cimetidine Palpitations, Other (See Comments)   "Allergic," per MAR   Naproxen Sodium Other (See Comments)   Affects glaucoma medication   Fluzone [influenza Virus Vaccine] Other (See Comments)   "Allergic," per MAR   Fosamax [alendronate Sodium] Other (See Comments)   "Allergic," per MAR   Loratadine Other (See Comments)   "Allergic," per MAR   Iodinated Contrast Media Nausea Only        Medication List     STOP taking these medications    aspirin EC 81 MG tablet   furosemide 40 MG tablet Commonly known as: Lasix       TAKE these medications    apixaban 2.5 MG Tabs  tablet Commonly known as: ELIQUIS Take 1 tablet (2.5 mg total) by mouth 2 (two) times daily. What changed:  medication strength how much to take   benzonatate 100 MG capsule Commonly known as: TESSALON Take 100 mg by mouth 3 (three) times daily as needed for cough.   Ensure Take 237 mLs by mouth 3 (three) times daily between meals.   gabapentin 100 MG capsule Commonly known as: NEURONTIN Take 100 mg by mouth at bedtime.   HYDROcodone-acetaminophen 5-325 MG tablet Commonly known as: NORCO/VICODIN Take 1 tablet by mouth 3 (three) times daily as needed for moderate  pain. What changed:  when to take this reasons to take this   levETIRAcetam 500 MG tablet Commonly known as: KEPPRA Take 500 mg by mouth 2 (two) times daily.   levothyroxine 50 MCG tablet Commonly known as: SYNTHROID Take 50 mcg by mouth daily before breakfast.   LORazepam 0.5 MG tablet Commonly known as: ATIVAN Take 0.5 tablets (0.25 mg total) by mouth See admin instructions. 0.25mg  oral in the evening And 0.25mg  oral every 12 hours as needed for breakthrough anxiety   metoprolol tartrate 25 MG tablet Commonly known as: LOPRESSOR Take 1 tablet (25 mg total) by mouth 2 (two) times daily.   omeprazole 20 MG capsule Commonly known as: PRILOSEC Take 20 mg by mouth at bedtime.   polyethylene glycol 17 g packet Commonly known as: MIRALAX / GLYCOLAX Take 17 g by mouth daily.   rosuvastatin 20 MG tablet Commonly known as: CRESTOR Take 1 tablet (20 mg total) by mouth daily.   Senna Plus 8.6-50 MG Caps Generic drug: Sennosides-Docusate Sodium Take 1 capsule by mouth at bedtime.   sertraline 50 MG tablet Commonly known as: ZOLOFT Take 50 mg by mouth daily.        Contact information for follow-up providers     [facility]. Schedule an appointment as soon as possible for a visit in 1 week(s).   Contact information: [address] [place] [phone] [facility] Follow up in 1 month(s).   Contact information: [address]     [address] [place]  [phone]             Contact information for after-discharge care     Destination     [facility] .   Service: Skilled Nursing Contact information: [address] [place] [address] [phone]                    Allergies  Allergen Reactions   Cimetidine Palpitations and Other (See Comments)    "Allergic," per MAR   Naproxen Sodium Other  (See Comments)    Affects glaucoma medication   Fluzone [Influenza Virus Vaccine] Other (See Comments)    "Allergic," per MAR   Fosamax [Alendronate Sodium] Other (See Comments)    "Allergic," per MAR   Loratadine Other (See Comments)    "Allergic," per MAR   Iodinated Contrast Media Nausea Only     Other Procedures/Studies: DG Swallowing Func-Speech Pathology  Result Date: 09/22/2022 Table formatting from the original result was not included. Images from the original result were not included. Objective Swallowing Evaluation: Type of Study: MBS-Modified Barium Swallow Study  Patient Details Name: NASTASIA PLACIDO MRN: [id number] Date of Birth: 04-25-1922 Today's Date: 09/22/2022 Time: SLP Start Time (ACUTE ONLY): 1150 -SLP Stop Time (ACUTE ONLY): [id number] SLP Time Calculation (min) (  ACUTE ONLY): 14 min Past Medical History: Past Medical History: Diagnosis Date  Acute heart failure with preserved ejection fraction (HFpEF) ([place]) 01/13/2021  Acute ischemic stroke ([place]) 10/02/2014  right cerebellar and left posterior temporal CVA: This was confirmed on MRI. We suspected an embolic source given the distribution on MRI. She has no history of atrial fib and no atrial fib was seen on telemetry. She was seen by neurology in consultation and underwent carotid US which showed mild right carotid stenosis (<50%) and moderate left carotid stenosis (50-69%). She was treated with aspiri  Acute on chronic heart failure with preserved ejection fraction (HFpEF) ([place])   CAP (community acquired pneumonia) 03/17/2015  Chronic back pain   "used to get shots in my back by a Pain Management doctor"  COVID-19   Dyspnea   GERD (gastroesophageal reflux disease)   Glaucoma   Hyperlipidemia   Hypertension   Hypothyroidism   Ileus ([place]) 05/30/2019  Inferior pubic ramus fracture, right, with routine healing, subsequent encounter   Pneumonia due to COVID-19 virus 12/10/2020  SBO (small bowel obstruction) ([place]) 05/30/2019  Seizure ([place]) 10/02/2014   Small bowel obstruction ([facility])   Thyroid disease   UTI from E.coli 2018  Vertebral compression fracture ([facility])  Past Surgical History: Past Surgical History: Procedure Laterality Date  CATARACT EXTRACTION, BILATERAL    CHOLECYSTECTOMY   HPI: Pt is  88 y.o. female who presented to ED with acute onset of fall with subsequent head injury.  CXR and Head CT (10/12) both neg for acute changes/abnormality. PMH: HFpEF, CVA, GERD, hypertension, dyslipidemia, paroxysmal atrial fibrillation on Eliquis, hypothyroidism and seizure disorder.  No data recorded  Recommendations for follow up therapy are one component of a multi-disciplinary discharge planning process, led by the attending physician.  Recommendations may be updated based on patient status, additional functional criteria and insurance authorization. Assessment / Plan / Recommendation   09/22/2022  12:22 PM Clinical Impressions Clinical Impression Pt presents with oropharyngeal dysphagia marked by reduced bolus cohesion and AP transit, poor BOT retraction, reduced velopharyngeal closure, and delayed swallow initation which resulted in consistent flash penetration of thin liquids (PAS 2) with x1 instance of trace silent aspiration (PAS 8) when taking pill whole with thin liquids. Pt very impulsive with liquid intake despite cueing for small volumes, although even large volumes were consumed without incidence of aspiration in multiple attempts by cup/straw. Prolonged mastication and bolus formation evident with regular textures, improved with sips of liquid. Poor BOT retraction resulted in inconsistent epiglottic inversion and vallecular residue. Trace-mild oral and vallecular residuals were minimized/cleared with spontaneous secondary dry swallows or liquid washes. Poor velopharyngeal closure exhibited when attempting to swallow barium tablet with thin. Subsequent nasopharyngeal reflux of liquid occured. Recommend dys 3 (mechanical soft) diet, thin liquids with 1:1  supervision for all meals to monitor for slow rate, small bites/sips, alternation of bites with sips. SLP to f/u for tolerance. SLP Visit Diagnosis Dysphagia, oropharyngeal phase (R13.12) Impact on safety and function Mild aspiration risk     09/22/2022  12:22 PM Treatment Recommendations Treatment Recommendations Therapy as outlined in treatment plan below     09/22/2022  12:22 PM Prognosis Prognosis for Safe Diet Advancement Good Barriers to Reach Goals Cognitive deficits;Time post onset   09/22/2022  12:22 PM Diet Recommendations SLP Diet Recommendations Dysphagia 3 (Mech soft) solids;Thin liquid Liquid Administration via Cup;Straw Medication Administration Crushed with puree Compensations Minimize environmental distractions;Slow rate;Small sips/bites;Follow solids with liquid Postural Changes Seated upright at 90 degrees  09/22/2022  12:22 PM Other Recommendations Oral Care Recommendations Oral care BID Follow Up Recommendations Other (comment) Assistance recommended at discharge Frequent or constant Supervision/Assistance Functional Status Assessment Patient has had a recent decline in their functional status and demonstrates the ability to make significant improvements in function in a reasonable and predictable amount of time.   09/22/2022  12:22 PM Frequency and Duration  Speech Therapy Frequency (ACUTE ONLY) min 2x/week Treatment Duration 2 weeks     09/22/2022  12:22 PM Oral Phase Oral Phase Impaired Oral - Thin Cup Lingual pumping;Weak lingual manipulation;Reduced posterior propulsion;Lingual/palatal residue;Decreased bolus cohesion Oral - Thin Straw Lingual pumping;Weak lingual manipulation;Reduced posterior propulsion;Lingual/palatal residue;Decreased bolus cohesion;Decreased velopharyngeal closure Oral - Puree Lingual pumping;Weak lingual manipulation;Reduced posterior propulsion;Lingual/palatal residue;Decreased bolus cohesion Oral - Regular Lingual pumping;Weak lingual manipulation;Reduced  posterior propulsion;Lingual/palatal residue;Decreased bolus cohesion;Impaired mastication Oral - Pill Reduced posterior propulsion;Decreased velopharyngeal closure    09/22/2022  12:22 PM Pharyngeal Phase Pharyngeal Phase Impaired Pharyngeal- Thin Cup Delayed swallow initiation-pyriform sinuses;Reduced epiglottic inversion;Reduced tongue base retraction;Penetration/Aspiration during swallow;Pharyngeal residue - valleculae;Nasopharyngeal reflux Pharyngeal Material enters airway, remains ABOVE vocal cords then ejected out Pharyngeal- Thin Straw Delayed swallow initiation-pyriform sinuses;Reduced epiglottic inversion;Reduced tongue base retraction;Penetration/Aspiration during swallow;Pharyngeal residue - valleculae;Trace aspiration;Nasopharyngeal reflux Pharyngeal Material enters airway, passes BELOW cords without attempt by patient to eject out (silent aspiration) Pharyngeal- Puree Reduced epiglottic inversion;Reduced tongue base retraction;Pharyngeal residue - valleculae Pharyngeal- Regular Reduced epiglottic inversion;Reduced tongue base retraction;Pharyngeal residue - valleculae     No data to display    [place], [place], [place] Office Number: [phone] Acie Fredrickson 09/22/2022, 1:36 PM                     CT Head Wo Contrast  Result Date: 09/21/2022 CLINICAL DATA:  Head trauma, minor (Age >= 65y). fall EXAM: CT HEAD WITHOUT CONTRAST TECHNIQUE: Contiguous axial images were obtained from the base of the skull through the vertex without intravenous contrast. RADIATION DOSE REDUCTION: This exam was performed according to the departmental dose-optimization program which includes automated exposure control, adjustment of the mA and/or kV according to patient size and/or use of iterative reconstruction technique. COMPARISON:  CT head 09/10/2019 BRAIN: BRAIN Cerebral ventricle sizes are concordant with the degree of cerebral volume loss. Patchy and confluent areas of  decreased attenuation are noted throughout the deep and periventricular white matter of the cerebral hemispheres bilaterally, compatible with chronic microvascular ischemic disease. Chronic right occipital infarction. No evidence of large-territorial acute infarction. No parenchymal hemorrhage. No mass lesion. No extra-axial collection. No mass effect or midline shift. No hydrocephalus. Basilar cisterns are patent. Vascular: No hyperdense vessel. Skull: No acute fracture or focal lesion. Sinuses/Orbits: Paranasal sinuses and mastoid air cells are clear. Bilateral lens replacement. The orbits are unremarkable. Other: None. IMPRESSION: No acute intracranial abnormality. Electronically Signed   By: Iven Finn M.D.   On: 09/21/2022 22:55   DG Chest Portable 1 View  Result Date: 09/21/2022 CLINICAL DATA:  Level 2 trauma, fall. EXAM: PORTABLE CHEST 1 VIEW COMPARISON:  09/07/2022. FINDINGS: The heart is enlarged and the mediastinal contour is stable. There is atherosclerotic calcification of the aorta. Lung volumes are low. Mild interstitial prominence is present bilaterally and likely chronic. No consolidation, effusion, or pneumothorax. Mild apical pleural scarring is noted bilaterally. No acute osseous abnormality. IMPRESSION: No active disease. Electronically Signed   By: Brett Fairy M.D.   On: 09/21/2022 22:39   CT HEAD WO CONTRAST (5MM)  Result Date:  09/09/2022 CLINICAL DATA:  Stroke follow-up EXAM: CT HEAD WITHOUT CONTRAST TECHNIQUE: Contiguous axial images were obtained from the base of the skull through the vertex without intravenous contrast. RADIATION DOSE REDUCTION: This exam was performed according to the departmental dose-optimization program which includes automated exposure control, adjustment of the mA and/or kV according to patient size and/or use of iterative reconstruction technique. COMPARISON:  Brain MRI from yesterday FINDINGS: Brain: Recent infarcts in the right occipital cortex and  bilateral thalamus by MRI. Chronic small vessel ischemia in the deep cerebral white matter. Remote perforator infarct in the right striatum. No hematoma, hydrocephalus, or shift. Vascular: Prominent distal basilar density which may relate to the CTA findings. Atheromatous calcification Skull: None Sinuses/Orbits: No acute finding. IMPRESSION: Recent posterior circulation infarcts without evidence of progression. No shift or hematoma. Electronically Signed   By: Jorje Guild M.D.   On: 09/09/2022 07:59   MR BRAIN WO CONTRAST  Result Date: 09/08/2022 CLINICAL DATA:  Stroke follow-up EXAM: MRI HEAD WITHOUT CONTRAST TECHNIQUE: Multiplanar, multiecho pulse sequences of the brain and surrounding structures were obtained without intravenous contrast. COMPARISON:  No prior MRI, correlation is made with CT head and CTA head and neck 09/07/2022 FINDINGS: Evaluation is somewhat limited by motion artifact. Brain: Restricted diffusion with ADC correlate in the medial right occipital lobe and posterior hippocampus (series 5, images 64-70), with an additional focus of restricted diffusion in the anteromedial right thalamus (series 5, image 72), consistent with acute infarcts. The right occipital infarct is associated with gyriform hemosiderin deposition, likely hemorrhage. Surrounding T2 hyperintense signal, likely edema, with mild local mass effect. No mass, midline shift, hydrocephalus or extra-axial collection. Additional foci of hemosiderin deposition in the right thalamus and right temporal lobe, likely sequela more remote microhemorrhages. Confluent T2 hyperintense signal in the periventricular white matter, likely the sequela of moderate chronic small vessel ischemic disease. Vascular: Normal arterial flow voids. Skull and upper cervical spine: Normal marrow signal. Sinuses/Orbits: No acute finding. Status post bilateral lens replacements. Other: The mastoids are well aerated. IMPRESSION: Acute infarcts in the medial  right occipital lobe and posterior hippocampus, with an additional focus of acute infarct in the anteromedial right thalamus. The right occipital infarct is associated with gyriform hemosiderin deposition, likely hemorrhage, with mild local mass effect but no midline shift. These results will be called to the ordering clinician or representative by the Radiologist Assistant, and communication documented in the PACS or [employer]. Electronically Signed   By: Merilyn Baba M.D.   On: 09/08/2022 22:17   ECHOCARDIOGRAM COMPLETE  Result Date: 09/08/2022    ECHOCARDIOGRAM REPORT   Patient Name:   SACHIKO METHOT Date of Exam: 09/08/2022 Medical Rec #:  [id number]    Height:       64.0 in Accession #:    [id number]   Weight:       153.0 lb Date of Birth:  07-03-1922    BSA:          1.746 m Patient Age:    88 years     BP:           164/73 mmHg Patient Gender: F            HR:           93 bpm. Exam Location:  Inpatient Procedure: 2D Echo, Cardiac Doppler and Color Doppler Indications:    Stroke 434.91 / I163.9  History:        Patient has prior history of Echocardiogram examinations,  most                 recent 05/31/2019. Stroke, Signs/Symptoms:Dyspnea; Risk                 Factors:Hypertension and Dyslipidemia.  Sonographer:    Ronny Flurry Sonographer#2:  Melissa Morford RDCS (AE, PE) Referring Phys: [phone] MCNEILL P KIRKPATRICK  Sonographer Comments: Technically difficult study due to poor echo windows, suboptimal parasternal window and suboptimal apical window. IMPRESSIONS  1. Left ventricular ejection fraction, by estimation, is 60 to 65%. The left ventricle has normal function. The left ventricle has no regional wall motion abnormalities. There is severe left ventricular hypertrophy. Left ventricular diastolic parameters  are indeterminate.  2. Right ventricular systolic function is normal. The right ventricular size is normal.  3. The mitral valve is normal in structure. Mild mitral valve regurgitation. No  evidence of mitral stenosis. Severe mitral annular calcification.  4. The aortic valve has an indeterminant number of cusps. Aortic valve regurgitation is not visualized. Mild aortic valve stenosis.  5. The inferior vena cava is normal in size with greater than 50% respiratory variability, suggesting right atrial pressure of 3 mmHg. FINDINGS  Left Ventricle: Left ventricular ejection fraction, by estimation, is 60 to 65%. The left ventricle has normal function. The left ventricle has no regional wall motion abnormalities. The left ventricular internal cavity size was normal in size. There is  severe left ventricular hypertrophy. Left ventricular diastolic parameters are indeterminate. Right Ventricle: The right ventricular size is normal. Right ventricular systolic function is normal. Left Atrium: Left atrial size was normal in size. Right Atrium: Right atrial size was normal in size. Pericardium: There is no evidence of pericardial effusion. Mitral Valve: The mitral valve is normal in structure. Severe mitral annular calcification. Mild mitral valve regurgitation. No evidence of mitral valve stenosis. MV peak gradient, 6.6 mmHg. The mean mitral valve gradient is 4.0 mmHg. Tricuspid Valve: The tricuspid valve is normal in structure. Tricuspid valve regurgitation is trivial. No evidence of tricuspid stenosis. Aortic Valve: The aortic valve has an indeterminant number of cusps. Aortic valve regurgitation is not visualized. Mild aortic stenosis is present. Aortic valve mean gradient measures 13.5 mmHg. Aortic valve peak gradient measures 20.8 mmHg. Aortic valve  area, by VTI measures 0.72 cm. Pulmonic Valve: The pulmonic valve was not well visualized. Pulmonic valve regurgitation is trivial. No evidence of pulmonic stenosis. Aorta: The aortic root is normal in size and structure. Venous: The inferior vena cava is normal in size with greater than 50% respiratory variability, suggesting right atrial pressure of 3 mmHg.  IAS/Shunts: No atrial level shunt detected by color flow Doppler.  LEFT VENTRICLE PLAX 2D LVIDd:         3.80 cm   Diastology LVIDs:         3.50 cm   LV e' medial:    2.70 cm/s LV PW:         1.10 cm   LV E/e' medial:  56.9 LV IVS:        1.60 cm   LV e' lateral:   4.35 cm/s LVOT diam:     1.80 cm   LV E/e' lateral: 35.3 LV SV:         36 LV SV Index:   21 LVOT Area:     2.54 cm  RIGHT VENTRICLE RV S prime:     7.35 cm/s TAPSE (M-mode): 1.4 cm LEFT ATRIUM  Index        RIGHT ATRIUM           Index LA diam:        3.80 cm 2.18 cm/m   RA Area:     11.75 cm LA Vol (A2C):   39.5 ml 22.63 ml/m  RA Volume:   23.45 ml  13.43 ml/m LA Vol (A4C):   67.0 ml 38.38 ml/m LA Biplane Vol: 53.7 ml 30.76 ml/m  AORTIC VALVE AV Area (Vmax):    0.81 cm AV Area (Vmean):   0.76 cm AV Area (VTI):     0.72 cm AV Vmax:           228.00 cm/s AV Vmean:          175.000 cm/s AV VTI:            0.494 m AV Peak Grad:      20.8 mmHg AV Mean Grad:      13.5 mmHg LVOT Vmax:         72.47 cm/s LVOT Vmean:        52.533 cm/s LVOT VTI:          0.141 m LVOT/AV VTI ratio: 0.28  AORTA Ao Root diam: 2.70 cm Ao Asc diam:  3.00 cm MITRAL VALVE MV Area (PHT): 3.97 cm     SHUNTS MV Area VTI:   1.31 cm     Systemic VTI:  0.14 m MV Peak grad:  6.6 mmHg     Systemic Diam: 1.80 cm MV Mean grad:  4.0 mmHg MV Vmax:       1.28 m/s MV Vmean:      94.1 cm/s MV Decel Time: 191 msec MV E velocity: 153.50 cm/s MV A velocity: 112.00 cm/s MV E/A ratio:  1.37 Kirk Ruths MD Electronically signed by Kirk Ruths MD Signature Date/Time: 09/08/2022/12:42:29 PM    Final    DG Chest Portable 1 View  Result Date: 09/07/2022 CLINICAL DATA:  Possible aspiration EXAM: PORTABLE CHEST 1 VIEW COMPARISON:  01/13/2021 FINDINGS: Heart is normal size. Diffuse interstitial prominence throughout the lungs, similar prior study. Favor chronic interstitial lung disease. No effusions or acute bony abnormality. Aortic atherosclerosis. IMPRESSION: Diffuse  interstitial prominence throughout the lungs, favor chronic interstitial lung disease. Electronically Signed   By: Rolm Baptise M.D.   On: 09/07/2022 22:09   CT ANGIO HEAD NECK W WO CM (CODE STROKE)  Result Date: 09/07/2022 CLINICAL DATA:  Neuro deficit, acute, stroke suspected. EXAM: CT ANGIOGRAPHY HEAD AND NECK TECHNIQUE: Multidetector CT imaging of the head and neck was performed using the standard protocol during bolus administration of intravenous contrast. Multiplanar CT image reconstructions and MIPs were obtained to evaluate the vascular anatomy. Carotid stenosis measurements (when applicable) are obtained utilizing NASCET criteria, using the distal internal carotid diameter as the denominator. RADIATION DOSE REDUCTION: This exam was performed according to the departmental dose-optimization program which includes automated exposure control, adjustment of the mA and/or kV according to patient size and/or use of iterative reconstruction technique. CONTRAST:  83mL OMNIPAQUE IOHEXOL 350 MG/ML SOLN COMPARISON:  None Available. FINDINGS: CTA NECK FINDINGS Aortic arch: Standard 3 vessel aortic arch with moderately extensive calcified and soft plaque. No flow limiting stenosis of the arch vessel origins. Right carotid system: Patent with a moderate amount of calcified plaque at the carotid bifurcation resulting in severe stenosis of the ECA origin. No significant common or internal carotid artery stenosis. Left carotid system: Patent with a small amount of calcified  plaque at the carotid bifurcation. No evidence of a significant stenosis or dissection. Vertebral arteries: Patent with the left being strongly dominant. Mild atherosclerotic irregularity of the left vertebral artery without significant stenosis. Severe stenosis of the right vertebral origin. Mild right V3 segment stenosis. Skeleton: Moderate lower cervical disc degeneration. Moderately advanced mid upper cervical facet arthrosis with bilateral facet  ankylosis at C2-3. Other neck: No evidence of cervical lymphadenopathy or mass. Upper chest: Mosaic attenuation, mild interlobular septal thickening, and bronchial wall thickening in the included upper lobes. Review of the MIP images confirms the above findings CTA HEAD FINDINGS Anterior circulation: The internal carotid arteries are patent from skull base to carotid termini with calcified plaque resulting in mild cavernous and moderate paraclinoid stenosis bilaterally. ACAs and MCAs are patent with relatively widespread atherosclerosis involving the branch vessels including severe bilateral M2 and A2 stenoses. There are also severe right A1 and mild left M1 stenoses. No aneurysm is identified. Posterior circulation: The intracranial left vertebral artery is patent and supplies the basilar. The right vertebral artery ends in PICA. The basilar artery is patent proximally, however there is a filling defect in the distal basilar artery consistent with thrombus which extends into the proximal right P1 segment. There is reconstitution of the right P1 segment more distally, however there is additional occlusion of the right PCA at the P1-P2 junction with distal reconstitution of the distal P2 segment and P3 branches. The left PCA is patent, however its origin is severely narrowed by the distal basilar thrombus, and there are also severe left P2 and P3 stenoses. No aneurysm is identified. Venous sinuses: As permitted by contrast timing, patent. Anatomic variants: None. Review of the MIP images confirms the above findings These results were communicated to Dr. Leonel Ramsay at 9:15 pm on 09/07/2022 by text page via the [facility] messaging system. IMPRESSION: 1. 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 2. Additional occlusion of the right PCA at the P1-P2 junction with distal reconstitution. 3. Intracranial atherosclerosis including severe bilateral M2  and A2 stenoses and mild-to-moderate ICA stenoses. 4. Severe stenosis of the origin of the nondominant right vertebral artery. 5.  Aortic Atherosclerosis (ICD10-I70.0). Electronically Signed   By: Logan Bores M.D.   On: 09/07/2022 21:34   CT HEAD CODE STROKE WO CONTRAST  Result Date: 09/07/2022 CLINICAL DATA:  Code stroke. EXAM: CT HEAD WITHOUT CONTRAST TECHNIQUE: Contiguous axial images were obtained from the base of the skull through the vertex without intravenous contrast. RADIATION DOSE REDUCTION: This exam was performed according to the departmental dose-optimization program which includes automated exposure control, adjustment of the mA and/or kV according to patient size and/or use of iterative reconstruction technique. COMPARISON:  09/27/2021 FINDINGS: Brain: No evidence of acute infarction, hemorrhage, cerebral edema, mass, mass effect, or midline shift. No hydrocephalus or extra-axial collection. Periventricular white matter changes, likely the sequela of chronic small vessel ischemic disease. Remote lacunar infarct in the right lentiform nucleus. Vascular: No hyperdense vessel. Atherosclerotic calcifications in the intracranial carotid and vertebral arteries. Skull: Negative for fracture or focal lesion. Sinuses/Orbits: No acute finding. Other: The mastoid air cells are well aerated. ASPECTS [facility] Stroke Program Early CT Score) - Ganglionic level infarction (caudate, lentiform nuclei, internal capsule, insula, M1-M3 cortex): 7 - Supraganglionic infarction (M4-M6 cortex): 3 Total score (0-10 with 10 being normal): 10 IMPRESSION: 1. No acute intracranial process. 2. ASPECTS is 10 Code stroke imaging results were communicated on 09/07/2022 at  8:47 pm to provider Dr. Leonel Ramsay via secure text paging. Electronically Signed   By: Merilyn Baba M.D.   On: 09/07/2022 20:48     TODAY-DAY OF DISCHARGE:  Subjective:   Dian Situ today has no headache,no chest abdominal pain,no new weakness tingling  or numbness, feels much better wants to go home today.   Objective:   Blood pressure 120/65, pulse 69, temperature 97.8 F (36.6 C), temperature source Oral, resp. rate 20, height 5\' 4"  (1.626 m), weight 69.4 kg, SpO2 97 %.  Intake/Output Summary (Last 24 hours) at 09/28/2022 0910 Last data filed at 09/27/2022 1800 Gross per 24 hour  Intake 400 ml  Output 400 ml  Net 0 ml   Filed Weights   09/21/22 2227  Weight: 69.4 kg    Exam: Awake Alert, Oriented *3, No new F.N deficits, Normal affect [place].AT,PERRAL Supple Neck,No JVD, No cervical lymphadenopathy appriciated.  Symmetrical Chest wall movement, Good air movement bilaterally, CTAB RRR,No Gallops,Rubs or new Murmurs, No Parasternal Heave +ve B.Sounds, Abd Soft, Non tender, No organomegaly appriciated, No rebound -guarding or rigidity. No Cyanosis, Clubbing or edema, No new Rash or bruise   PERTINENT RADIOLOGIC STUDIES: No results found.   PERTINENT LAB RESULTS: CBC: No results for input(s): "WBC", "HGB", "HCT", "PLT" in the last 72 hours. CMET CMP     Component Value Date/Time   NA 129 (L) 09/25/2022 0343   K 3.6 09/25/2022 0343   CL 99 09/25/2022 0343   CO2 17 (L) 09/25/2022 0343   GLUCOSE 115 (H) 09/25/2022 0343   BUN 38 (H) 09/25/2022 0343   CREATININE 1.56 (H) 09/25/2022 0343   CALCIUM 7.7 (L) 09/25/2022 0343   PROT 6.0 (L) 09/21/2022 2212   ALBUMIN 2.6 (L) 09/21/2022 2212   AST 21 09/21/2022 2212   ALT 13 09/21/2022 2212   ALKPHOS 62 09/21/2022 2212   BILITOT 0.7 09/21/2022 2212   GFRNONAA 30 (L) 09/25/2022 0343   GFRAA >60 06/03/2019 0522    GFR Estimated Creatinine Clearance: 19.7 mL/min (A) (by C-G formula based on SCr of 1.56 mg/dL (H)). No results for input(s): "LIPASE", "AMYLASE" in the last 72 hours. No results for input(s): "CKTOTAL", "CKMB", "CKMBINDEX", "TROPONINI" in the last 72 hours. Invalid input(s): "POCBNP" No results for input(s): "DDIMER" in the last 72 hours. No results for  input(s): "HGBA1C" in the last 72 hours. No results for input(s): "CHOL", "HDL", "LDLCALC", "TRIG", "CHOLHDL", "LDLDIRECT" in the last 72 hours. No results for input(s): "TSH", "T4TOTAL", "T3FREE", "THYROIDAB" in the last 72 hours.  Invalid input(s): "FREET3" No results for input(s): "VITAMINB12", "FOLATE", "FERRITIN", "TIBC", "IRON", "RETICCTPCT" in the last 72 hours. Coags: No results for input(s): "INR" in the last 72 hours.  Invalid input(s): "PT" Microbiology: No results found for this or any previous visit (from the past 240 hour(s)).  FURTHER DISCHARGE INSTRUCTIONS:  Get Medicines reviewed and adjusted: Please take all your medications with you for your next visit with your Primary MD  Laboratory/radiological data: Please request your Primary MD to go over all hospital tests and procedure/radiological results at the follow up, please ask your Primary MD to get all Hospital records sent to his/her office.  In some cases, they will be blood work, cultures and biopsy results pending at the time of your discharge. Please request that your primary care M.D. goes through all the records of your hospital data and follows up on these results.  Also Note the following: If you experience worsening of your admission symptoms, develop shortness  of breath, life threatening emergency, suicidal or homicidal thoughts you must seek medical attention immediately by calling 911 or calling your MD immediately  if symptoms less severe.  You must read complete instructions/literature along with all the possible adverse reactions/side effects for all the Medicines you take and that have been prescribed to you. Take any new Medicines after you have completely understood and accpet all the possible adverse reactions/side effects.   Do not drive when taking Pain medications or sleeping medications (Benzodaizepines)  Do not take more than prescribed Pain, Sleep and Anxiety Medications. It is not advisable  to combine anxiety,sleep and pain medications without talking with your primary care practitioner  Special Instructions: If you have smoked or chewed Tobacco  in the last 2 yrs please stop smoking, stop any regular Alcohol  and or any Recreational drug use.  Wear Seat belts while driving.  Please note: You were cared for by a hospitalist during your hospital stay. Once you are discharged, your primary care physician will handle any further medical issues. Please note that NO REFILLS for any discharge medications will be authorized once you are discharged, as it is imperative that you return to your primary care physician (or establish a relationship with a primary care physician if you do not have one) for your post hospital discharge needs so that they can reassess your need for medications and monitor your lab values.  Total Time spent coordinating discharge including counseling, education and face to face time equals   Signed: Neev Mcmains 09/28/2022 9:10 AM

## 2022-09-28 NOTE — Progress Notes (Signed)
   09/28/22 1025  AVS Discharge Documentation  AVS Discharge Instructions Including Medications Provided to patient/caregiver  Name of Person Receiving AVS Discharge Instructions Including Medications Elmyra Ricks, LPN @ [facility] [phone]  Name of Clinician That Reviewed AVS Discharge Instructions Including Medications Oval Linsey, RN

## 2022-09-28 NOTE — TOC Transition Note (Signed)
Transition of Care [facility]) - CM/SW Discharge Note   Patient Details  Name: MATHA MASSE MRN: [id number] Date of Birth: November 02, 1922  [facility] CM/SW Contact:  Benard Halsted, LCSW Phone Number: 09/28/2022, 9:45 AM   Clinical Narrative:    Patient will DC to: [facility] Anticipated DC date: 09/28/22 Family notified: Daughter, [name] Transport by: Corey Harold 10:30am   Per MD patient ready for DC to [facility]. RN to call report prior to discharge [phone], room 108). RN, patient, patient's family, and facility notified of DC. Discharge Summary and FL2 sent to facility. DC packet on chart. Ambulance transport requested for patient.   CSW will sign off for now as social work intervention is no longer needed. Please consult [place] again if new needs arise.     Final next level of care: Skilled Nursing Facility Barriers to Discharge: Barriers Resolved   Patient Goals and CMS Choice Patient states their goals for this hospitalization and ongoing recovery are:: Return to ALF CMS Medicare.gov Compare Post Acute Care list provided to:: Patient Represenative (must comment) Choice offered to / list presented to : Adult Children  Discharge Placement   Existing PASRR number confirmed : 09/28/22          Patient chooses bed at: [facility] Patient to be transferred to facility by: [facility] Name of family member notified: [name] Patient and family notified of of transfer: 09/28/22  Discharge Plan and Services In-house Referral: Clinical Social Work                                   Social Determinants of Health ([place]) Interventions     Readmission Risk Interventions     No data to display

## 2022-10-24 ENCOUNTER — Inpatient Hospital Stay: Payer: Self-pay | Admitting: Neurology

## 2022-12-11 ENCOUNTER — Emergency Department (HOSPITAL_COMMUNITY)
Admission: EM | Admit: 2022-12-11 | Discharge: 2022-12-11 | Disposition: A | Discharge status: Skilled Nursing Facility | Payer: Medicare Other | Attending: Emergency Medicine | Admitting: Emergency Medicine

## 2022-12-11 ENCOUNTER — Emergency Department (HOSPITAL_COMMUNITY): Payer: Medicare Other

## 2022-12-11 ENCOUNTER — Other Ambulatory Visit: Payer: Self-pay

## 2022-12-11 ENCOUNTER — Encounter (HOSPITAL_COMMUNITY): Payer: Self-pay

## 2022-12-11 DIAGNOSIS — F0390 Unspecified dementia without behavioral disturbance: Secondary | ICD-10-CM | POA: Diagnosis not present

## 2022-12-11 DIAGNOSIS — I110 Hypertensive heart disease with heart failure: Secondary | ICD-10-CM | POA: Diagnosis not present

## 2022-12-11 DIAGNOSIS — Z8673 Personal history of transient ischemic attack (TIA), and cerebral infarction without residual deficits: Secondary | ICD-10-CM | POA: Insufficient documentation

## 2022-12-11 DIAGNOSIS — I509 Heart failure, unspecified: Secondary | ICD-10-CM | POA: Insufficient documentation

## 2022-12-11 DIAGNOSIS — S51011A Laceration without foreign body of right elbow, initial encounter: Secondary | ICD-10-CM | POA: Diagnosis not present

## 2022-12-11 DIAGNOSIS — W19XXXA Unspecified fall, initial encounter: Secondary | ICD-10-CM | POA: Diagnosis not present

## 2022-12-11 DIAGNOSIS — R410 Disorientation, unspecified: Secondary | ICD-10-CM | POA: Insufficient documentation

## 2022-12-11 DIAGNOSIS — S0101XA Laceration without foreign body of scalp, initial encounter: Secondary | ICD-10-CM

## 2022-12-11 DIAGNOSIS — Z7901 Long term (current) use of anticoagulants: Secondary | ICD-10-CM | POA: Insufficient documentation

## 2022-12-11 MED ORDER — ACETAMINOPHEN 325 MG PO TABS
650.0000 mg | ORAL_TABLET | Freq: Once | ORAL | Status: AC
  Administered 2022-12-11: 650 mg via ORAL
  Filled 2022-12-11: qty 2

## 2022-12-11 MED ORDER — FENTANYL CITRATE PF 50 MCG/ML IJ SOSY
25.0000 ug | PREFILLED_SYRINGE | Freq: Once | INTRAMUSCULAR | Status: AC
  Administered 2022-12-11: 25 ug via INTRAVENOUS
  Filled 2022-12-11: qty 1

## 2022-12-11 MED ORDER — LIDOCAINE-EPINEPHRINE-TETRACAINE (LET) TOPICAL GEL
3.0000 mL | Freq: Once | TOPICAL | Status: AC
  Administered 2022-12-11: 3 mL via TOPICAL
  Filled 2022-12-11: qty 3

## 2022-12-11 MED ORDER — LIDOCAINE-EPINEPHRINE (PF) 2 %-1:200000 IJ SOLN
10.0000 mL | Freq: Once | INTRAMUSCULAR | Status: DC
  Filled 2022-12-11: qty 20

## 2022-12-11 NOTE — ED Provider Notes (Signed)
[facility]PARTMENT Provider Note   CSN: [id number] Arrival date & time: 12/11/22  1847     History  No chief complaint on file.   Raven Peterson is a 89 y.o. female.  HPI Patient presents after a fall.  This fall was unwitnessed.  She was found on the floor next to her bed.  She was noted to have laceration to the top of her scalp and skin tear to right elbow.  EMS was called to her skilled nursing facility.  History from patient is limited due to dementia.  Patient's caregiver arrived in the ED and confirmed patient is at her mental baseline.    Home Medications Prior to Admission medications   Medication Sig Start Date End Date Taking? Authorizing Provider  apixaban (ELIQUIS) 2.5 MG TABS tablet Take 1 tablet (2.5 mg total) by mouth 2 (two) times daily. 09/26/22   Ghimire, Henreitta Leber, MD  benzonatate (TESSALON) 100 MG capsule Take 100 mg by mouth 3 (three) times daily as needed for cough.    [provider]  Ensure (ENSURE) Take 237 mLs by mouth 3 (three) times daily between meals.    [provider]  gabapentin (NEURONTIN) 100 MG capsule Take 100 mg by mouth at bedtime.    [provider]  HYDROcodone-acetaminophen (NORCO/VICODIN) 5-325 MG tablet Take 1 tablet by mouth 3 (three) times daily as needed for moderate pain. 09/26/22   Ghimire, Henreitta Leber, MD  levETIRAcetam (KEPPRA) 500 MG tablet Take 500 mg by mouth 2 (two) times daily.    [provider]  levothyroxine (SYNTHROID) 50 MCG tablet Take 50 mcg by mouth daily before breakfast. 07/13/15   [provider]  LORazepam (ATIVAN) 0.5 MG tablet Take 0.5 tablets (0.25 mg total) by mouth See admin instructions. 0.25mg  oral in the evening And 0.25mg  oral every 12 hours as needed for breakthrough anxiety 09/26/22   Ghimire, Henreitta Leber, MD  metoprolol tartrate (LOPRESSOR) 25 MG tablet Take 1 tablet (25 mg total) by mouth 2 (two) times daily. 09/26/22   Ghimire, Henreitta Leber,  MD  omeprazole (PRILOSEC) 20 MG capsule Take 20 mg by mouth at bedtime.    [provider]  polyethylene glycol (MIRALAX / GLYCOLAX) 17 g packet Take 17 g by mouth daily. 06/03/19   Arrien, Jimmy Picket, MD  rosuvastatin (CRESTOR) 20 MG tablet Take 1 tablet (20 mg total) by mouth daily. 09/12/22   August Albino, NP  SENNA PLUS 50-8.6 MG CAPS Take 1 capsule by mouth at bedtime. 12/20/20   [provider]  sertraline (ZOLOFT) 50 MG tablet Take 50 mg by mouth daily.    [provider]      Allergies    Cimetidine, Naproxen sodium, Fluzone [influenza virus vaccine], Fosamax [alendronate sodium], Loratadine, and Iodinated contrast media    Review of Systems   Review of Systems  Unable to perform ROS: Dementia    Physical Exam Updated Vital Signs BP (!) 134/56   Pulse 76   Temp (!) 97.3 F (36.3 C) (Oral)   Resp 16   Ht 5\' 4"  (1.626 m)   Wt 69.4 kg   SpO2 96%   BMI 26.26 kg/m  Physical Exam Vitals and nursing note reviewed.  Constitutional:      General: She is not in acute distress.    Appearance: Normal appearance. She is well-developed and normal weight. She is not ill-appearing, toxic-appearing or diaphoretic.  HENT:     Head: Normocephalic.  Comments: 5 cm hemostatic laceration to top of scalp    Right Ear: External ear normal.     Left Ear: External ear normal.     Nose: Nose normal.     Mouth/Throat:     Mouth: Mucous membranes are moist.  Eyes:     Extraocular Movements: Extraocular movements intact.     Conjunctiva/sclera: Conjunctivae normal.  Cardiovascular:     Rate and Rhythm: Normal rate and regular rhythm.     Heart sounds: No murmur heard. Pulmonary:     Effort: Pulmonary effort is normal. No respiratory distress.     Breath sounds: Normal breath sounds. No wheezing or rales.  Chest:     Chest wall: No tenderness.  Abdominal:     General: There is no distension.     Palpations: Abdomen is soft.     Tenderness: There is  no abdominal tenderness.  Musculoskeletal:        General: No swelling, tenderness or deformity. Normal range of motion.     Cervical back: Normal range of motion and neck supple. No tenderness.     Right lower leg: No edema.     Left lower leg: No edema.  Skin:    General: Skin is warm and dry.     Capillary Refill: Capillary refill takes less than 2 seconds.     Comments: Small skin tear to right elbow  Neurological:     General: No focal deficit present.     Mental Status: She is alert. Mental status is at baseline. She is disoriented.     Cranial Nerves: No cranial nerve deficit.     Sensory: No sensory deficit.     Motor: No weakness.     Coordination: Coordination normal.  Psychiatric:        Mood and Affect: Mood normal.        Behavior: Behavior normal.     ED Results / Procedures / Treatments   Labs (all labs ordered are listed, but only abnormal results are displayed) Labs Reviewed - No data to display  EKG None  Radiology CT CERVICAL SPINE WO CONTRAST  Result Date: 12/11/2022 CLINICAL DATA:  Status post fall. EXAM: CT CERVICAL SPINE WITHOUT CONTRAST TECHNIQUE: Multidetector CT imaging of the cervical spine was performed without intravenous contrast. Multiplanar CT image reconstructions were also generated. RADIATION DOSE REDUCTION: This exam was performed according to the departmental dose-optimization program which includes automated exposure control, adjustment of the mA and/or kV according to patient size and/or use of iterative reconstruction technique. COMPARISON:  None Available. FINDINGS: Alignment: There is approximately 1 mm anterolisthesis of the C4 vertebral body on C5. 1 mm retrolisthesis of the C6 vertebral body is also seen on C7. Skull base and vertebrae: No acute cervical spine fracture. Chronic and degenerative changes seen along the tip of the dens and adjacent portion of the anterior arch of C1. A compression fracture deformity of indeterminate age is  seen at the level of T1 and T2 vertebral bodies. Soft tissues and spinal canal: No prevertebral fluid or swelling. No visible canal hematoma. Disc levels: Marked severity endplate sclerosis, anterior osteophyte formation and posterior bony spurring are seen at the levels of C5-C6 and C6-C7. There is marked severity narrowing of the anterior atlantoaxial articulation. Moderate to marked severity intervertebral disc space narrowing is seen at C5-C6 and C6-C7. Mild intervertebral disc space narrowing is noted throughout the remainder of the cervical spine. Bilateral marked severity multilevel facet joint hypertrophy is noted. Upper  chest: Negative. Other: None. IMPRESSION: 1. Compression fracture deformity of indeterminate age at the level of T1 and T2 vertebral bodies. MRI correlation is recommended. 2. No acute cervical spine fracture. 3. Marked severity multilevel degenerative changes, as described above. 4. 1 mm anterolisthesis of the C4 vertebral body on C5 and 1 mm retrolisthesis of the C6 vertebral body on C7. Electronically Signed   By: Aram Candela M.D.   On: 12/11/2022 19:43   CT HEAD WO CONTRAST  Result Date: 12/11/2022 CLINICAL DATA:  Patient found on the floor. EXAM: CT HEAD WITHOUT CONTRAST TECHNIQUE: Contiguous axial images were obtained from the base of the skull through the vertex without intravenous contrast. RADIATION DOSE REDUCTION: This exam was performed according to the departmental dose-optimization program which includes automated exposure control, adjustment of the mA and/or kV according to patient size and/or use of iterative reconstruction technique. COMPARISON:  September 21, 2022 FINDINGS: Brain: There is moderate severity cerebral atrophy with widening of the extra-axial spaces and ventricular dilatation. There are areas of decreased attenuation within the white matter tracts of the supratentorial brain, consistent with microvascular disease changes. A chronic right occipital lobe  infarct is noted. Vascular: There is marked severity calcification of the bilateral cavernous carotid arteries. Skull: Normal. Negative for fracture or focal lesion. Sinuses/Orbits: No acute finding. Other: None. IMPRESSION: 1. No acute intracranial abnormality. 2. Chronic right occipital lobe infarct. 3. Generalized cerebral atrophy and microvascular disease changes of the supratentorial brain. Electronically Signed   By: Aram Candela M.D.   On: 12/11/2022 19:35   DG Pelvis Portable  Result Date: 12/11/2022 CLINICAL DATA:  Trauma fall EXAM: PORTABLE PELVIS 1-2 VIEWS COMPARISON:  CT 09/27/2021 FINDINGS: SI joint degenerative change. Chronic changes at the pubic symphysis. Old right inferior pubic ramus fracture. No definite acute displaced fracture or malalignment. Moderate bilateral hip arthritis. IMPRESSION: No acute osseous abnormality. Old right inferior pubic ramus fracture. Moderate bilateral hip arthritis. Electronically Signed   By: Jasmine Pang M.D.   On: 12/11/2022 19:24   DG Chest Port 1 View  Result Date: 12/11/2022 CLINICAL DATA:  Fall on blood thinners EXAM: PORTABLE CHEST 1 VIEW COMPARISON:  09/21/2022 FINDINGS: No acute airspace disease. Stable cardiomediastinal silhouette with aortic atherosclerosis. No pneumothorax. IMPRESSION: No active disease. Electronically Signed   By: Jasmine Pang M.D.   On: 12/11/2022 19:22    Procedures .Marland KitchenLaceration Repair  Date/Time: 12/11/2022 9:03 PM  Performed by: Gloris Manchester, MD Authorized by: Gloris Manchester, MD   Consent:    Consent obtained:  Verbal   Consent given by:  Patient (Caregiver)   Risks, benefits, and alternatives were discussed: yes     Risks discussed:  Pain   Alternatives discussed:  No treatment Universal protocol:    Procedure explained and questions answered to patient or proxy's satisfaction: yes     Imaging studies available: yes     Patient identity confirmed:  Arm band Anesthesia:    Anesthesia method:  Topical  application   Topical anesthetic:  LET Laceration details:    Length (cm):  5   Depth (mm):  5 Pre-procedure details:    Preparation:  Imaging obtained to evaluate for foreign bodies Exploration:    Hemostasis achieved with:  LET   Imaging obtained comment:  CT   Imaging outcome: foreign body not noted     Wound exploration: wound explored through full range of motion and entire depth of wound visualized     Contaminated: no   Treatment:  Area cleansed with:  Saline   Amount of cleaning:  Standard   Irrigation solution:  Sterile saline   Irrigation volume:  100   Irrigation method:  Syringe Skin repair:    Repair method:  Staples   Number of staples:  8 Approximation:    Approximation:  Close Repair type:    Repair type:  Simple Post-procedure details:    Dressing:  Open (no dressing)   Procedure completion:  Tolerated well, no immediate complications     Medications Ordered in ED Medications  lidocaine-EPINEPHrine (XYLOCAINE W/EPI) 2 %-1:200000 (PF) injection 10 mL (has no administration in time range)  acetaminophen (TYLENOL) tablet 650 mg (650 mg Oral Given 12/11/22 1909)  lidocaine-EPINEPHrine-tetracaine (LET) topical gel (3 mLs Topical Given by Other 12/11/22 2015)  fentaNYL (SUBLIMAZE) injection 25 mcg (25 mcg Intravenous Given 12/11/22 2030)    ED Course/ Medical Decision Making/ A&P                           Medical Decision Making Amount and/or Complexity of Data Reviewed Radiology: ordered.  Risk OTC drugs. Prescription drug management.   This patient presents to the ED for concern of fall, this involves an extensive number of treatment options, and is a complaint that carries with it a high risk of complications and morbidity.  The differential diagnosis includes acute injuries   Co morbidities that complicate the patient evaluation  CHF, CVA, seizure, GERD, HLD, HTN, atrial fibrillation   Additional history obtained:  Additional history obtained  from patient's caregiver, EMS External records from outside source obtained and reviewed including EMR  Imaging Studies ordered:  I ordered imaging studies including x-ray of chest and pelvis, CT imaging of head and cervical spine I independently visualized and interpreted imaging which showed no acute findings.  Age-indeterminate compression deformities of T1 and T2 without associated tenderness on exam I agree with the radiologist interpretation  Problem List / ED Course / Critical interventions / Medication management  Patient presents from skilled nursing facility after an unwitnessed fall.  Patient was found next to her bed on the floor.  EMS was called.  Per EMS, patient is currently on hospice.  Family would not want aggressive interventions but patient does need management of laceration that was noted on the top of her head.  On arrival in the ED, patient is awake and alert.  She is disoriented.  Scalp laceration is hemostatic at this time.  Let gel was applied.  Patient underwent imaging studies which did not show any acute findings.  There were age-indeterminate compression deformities of T1 and T2.  Patient is without tenderness to these areas on exam.  Patient underwent laceration repair, as per procedure note above.  Caregivers present at bedside and confirms she is at her mental baseline.  Patient was discharged in stable condition. I ordered medication including Tylenol, let gel, fentanyl for analgesia Reevaluation of the patient after these medicines showed that the patient improved I have reviewed the patients home medicines and have made adjustments as needed   Social Determinants of Health:  Resides in nursing facility.  Currently on hospice.         Final Clinical Impression(s) / ED Diagnoses Final diagnoses:  Fall, initial encounter  Laceration of scalp, initial encounter    Rx / DC Orders ED Discharge Orders     None         Godfrey Pick, MD 12/11/22  2106

## 2022-12-11 NOTE — Discharge Instructions (Signed)
There are 8 staples in your scalp wound.  These will need to be removed in approximately 1 week.  If area becomes increasingly red, swollen, painful, or begins draining pus, please return to the emergency department.

## 2022-12-11 NOTE — ED Notes (Signed)
Patient transported to CT by RN.

## 2022-12-11 NOTE — ED Triage Notes (Signed)
Pt arrived via [facility] from [facility]. Per EMS, the caregiver just left pt's room and returned and found pt on floor. Pt is on eliquis. Pt has a pprox 2 in lac on top of head and a large skin tear on right arm. Pt is A&Ox2 to self and place.

## 2022-12-11 NOTE — ED Notes (Signed)
Attempted to give report to [facility] living, no answer.

## 2022-12-11 NOTE — ED Notes (Signed)
Pt's caregiver at bedside and updated pt's daughter about pt's dc and transfer back to [facility] via [facility].

## 2022-12-11 NOTE — Progress Notes (Signed)
Orthopedic Tech Progress Note Patient Details:  Raven Peterson Aug 23, 1922 [id number] Level 2 Trauma. Not needed Patient ID: Raven Peterson, female   DOB: Jul 20, 1922, 89 y.o.   MRN: [id number]  [name] 12/11/2022, 7:04 PM

## 2022-12-20 ENCOUNTER — Other Ambulatory Visit: Payer: Self-pay

## 2022-12-20 NOTE — Patient Outreach (Signed)
Telephone outreach to patient's daughter to obtain mRS was successfully completed. MRS= 5  Mentioned mother lost ability to walk after stroke.  [place] Management Assistant [phone]

## 2023-01-11 DEATH — deceased
